# Patient Record
Sex: Male | Born: 1945 | Race: White | Hispanic: No | Marital: Married | Smoking: Former smoker
Health system: Southern US, Community
[De-identification: ages and names within clinical notes are randomized; demographics above are authoritative.]

## PROBLEM LIST (undated history)

## (undated) DIAGNOSIS — K5730 Diverticulosis of large intestine without perforation or abscess without bleeding: Secondary | ICD-10-CM

## (undated) DIAGNOSIS — C61 Malignant neoplasm of prostate: Secondary | ICD-10-CM

## (undated) DIAGNOSIS — F1010 Alcohol abuse, uncomplicated: Secondary | ICD-10-CM

## (undated) DIAGNOSIS — M1990 Unspecified osteoarthritis, unspecified site: Secondary | ICD-10-CM

## (undated) DIAGNOSIS — E785 Hyperlipidemia, unspecified: Secondary | ICD-10-CM

## (undated) DIAGNOSIS — I519 Heart disease, unspecified: Secondary | ICD-10-CM

## (undated) DIAGNOSIS — I10 Essential (primary) hypertension: Secondary | ICD-10-CM

## (undated) DIAGNOSIS — E7800 Pure hypercholesterolemia, unspecified: Secondary | ICD-10-CM

## (undated) DIAGNOSIS — K635 Polyp of colon: Secondary | ICD-10-CM

## (undated) DIAGNOSIS — I739 Peripheral vascular disease, unspecified: Secondary | ICD-10-CM

## (undated) DIAGNOSIS — I499 Cardiac arrhythmia, unspecified: Secondary | ICD-10-CM

## (undated) DIAGNOSIS — I2510 Atherosclerotic heart disease of native coronary artery without angina pectoris: Secondary | ICD-10-CM

## (undated) HISTORY — DX: Essential (primary) hypertension: I10

## (undated) HISTORY — PX: LIPOMA EXCISION: SHX5283

## (undated) HISTORY — PX: APPENDECTOMY: SHX54

## (undated) HISTORY — DX: Malignant neoplasm of prostate: C61

## (undated) HISTORY — PX: CATARACT EXTRACTION: SUR2

## (undated) HISTORY — DX: Heart disease, unspecified: I51.9

## (undated) HISTORY — DX: Polyp of colon: K63.5

## (undated) HISTORY — DX: Pure hypercholesterolemia, unspecified: E78.00

## (undated) HISTORY — DX: Diverticulosis of large intestine without perforation or abscess without bleeding: K57.30

## (undated) HISTORY — PX: METACARPOPHALANGEAL JOINT ARTHROPLASTY: SUR72

---

## 2001-12-01 HISTORY — PX: PROSTATECTOMY: SHX69

## 2006-12-01 HISTORY — PX: COLON SURGERY: SHX602

## 2007-10-04 ENCOUNTER — Ambulatory Visit: Payer: Self-pay | Admitting: Gastroenterology

## 2007-11-16 ENCOUNTER — Inpatient Hospital Stay: Payer: Self-pay | Admitting: General Surgery

## 2010-11-11 ENCOUNTER — Ambulatory Visit: Payer: Self-pay | Admitting: Gastroenterology

## 2010-12-05 ENCOUNTER — Ambulatory Visit: Payer: Self-pay | Admitting: Internal Medicine

## 2012-06-14 DIAGNOSIS — M189 Osteoarthritis of first carpometacarpal joint, unspecified: Secondary | ICD-10-CM | POA: Insufficient documentation

## 2012-11-01 DIAGNOSIS — M25559 Pain in unspecified hip: Secondary | ICD-10-CM | POA: Insufficient documentation

## 2012-11-10 ENCOUNTER — Ambulatory Visit: Payer: Self-pay | Admitting: Gastroenterology

## 2013-03-23 ENCOUNTER — Ambulatory Visit: Payer: Self-pay | Admitting: Orthopedic Surgery

## 2013-04-04 DIAGNOSIS — M19019 Primary osteoarthritis, unspecified shoulder: Secondary | ICD-10-CM | POA: Insufficient documentation

## 2013-04-04 DIAGNOSIS — M25512 Pain in left shoulder: Secondary | ICD-10-CM | POA: Insufficient documentation

## 2013-06-07 ENCOUNTER — Encounter: Payer: Self-pay | Admitting: Sports Medicine

## 2013-07-01 ENCOUNTER — Encounter: Payer: Self-pay | Admitting: Sports Medicine

## 2013-10-06 ENCOUNTER — Encounter (INDEPENDENT_AMBULATORY_CARE_PROVIDER_SITE_OTHER): Payer: Medicare Other | Admitting: Ophthalmology

## 2013-10-06 DIAGNOSIS — H35059 Retinal neovascularization, unspecified, unspecified eye: Secondary | ICD-10-CM

## 2013-10-06 DIAGNOSIS — I10 Essential (primary) hypertension: Secondary | ICD-10-CM

## 2013-10-06 DIAGNOSIS — H2510 Age-related nuclear cataract, unspecified eye: Secondary | ICD-10-CM

## 2013-10-06 DIAGNOSIS — H35039 Hypertensive retinopathy, unspecified eye: Secondary | ICD-10-CM

## 2013-10-06 DIAGNOSIS — H43819 Vitreous degeneration, unspecified eye: Secondary | ICD-10-CM

## 2013-10-13 ENCOUNTER — Encounter (INDEPENDENT_AMBULATORY_CARE_PROVIDER_SITE_OTHER): Payer: Self-pay | Admitting: Ophthalmology

## 2013-10-24 ENCOUNTER — Ambulatory Visit (INDEPENDENT_AMBULATORY_CARE_PROVIDER_SITE_OTHER): Payer: Medicare Other | Admitting: Ophthalmology

## 2013-11-07 ENCOUNTER — Ambulatory Visit (INDEPENDENT_AMBULATORY_CARE_PROVIDER_SITE_OTHER): Payer: Medicare Other | Admitting: Ophthalmology

## 2013-11-07 DIAGNOSIS — H35059 Retinal neovascularization, unspecified, unspecified eye: Secondary | ICD-10-CM

## 2013-12-01 HISTORY — PX: ULNAR NERVE TRANSPOSITION: SHX2595

## 2013-12-19 ENCOUNTER — Ambulatory Visit: Payer: Self-pay | Admitting: Anesthesiology

## 2013-12-22 ENCOUNTER — Ambulatory Visit: Payer: Self-pay | Admitting: Orthopedic Surgery

## 2014-02-06 ENCOUNTER — Encounter (INDEPENDENT_AMBULATORY_CARE_PROVIDER_SITE_OTHER): Payer: Medicare Other | Admitting: Ophthalmology

## 2014-02-23 ENCOUNTER — Encounter (INDEPENDENT_AMBULATORY_CARE_PROVIDER_SITE_OTHER): Payer: Medicare Other | Admitting: Ophthalmology

## 2014-02-23 DIAGNOSIS — H2510 Age-related nuclear cataract, unspecified eye: Secondary | ICD-10-CM

## 2014-02-23 DIAGNOSIS — I10 Essential (primary) hypertension: Secondary | ICD-10-CM

## 2014-02-23 DIAGNOSIS — H43819 Vitreous degeneration, unspecified eye: Secondary | ICD-10-CM

## 2014-02-23 DIAGNOSIS — H35039 Hypertensive retinopathy, unspecified eye: Secondary | ICD-10-CM

## 2014-02-23 DIAGNOSIS — H35059 Retinal neovascularization, unspecified, unspecified eye: Secondary | ICD-10-CM

## 2014-04-26 DIAGNOSIS — E785 Hyperlipidemia, unspecified: Secondary | ICD-10-CM | POA: Insufficient documentation

## 2014-04-26 DIAGNOSIS — K5730 Diverticulosis of large intestine without perforation or abscess without bleeding: Secondary | ICD-10-CM | POA: Insufficient documentation

## 2014-04-26 DIAGNOSIS — I10 Essential (primary) hypertension: Secondary | ICD-10-CM | POA: Insufficient documentation

## 2014-07-05 ENCOUNTER — Encounter: Payer: Self-pay | Admitting: *Deleted

## 2014-07-06 ENCOUNTER — Encounter: Payer: Self-pay | Admitting: Neurology

## 2014-07-06 ENCOUNTER — Ambulatory Visit (INDEPENDENT_AMBULATORY_CARE_PROVIDER_SITE_OTHER): Payer: Medicare Other | Admitting: Neurology

## 2014-07-06 VITALS — BP 110/80 | HR 70 | Ht 69.69 in | Wt 213.0 lb

## 2014-07-06 DIAGNOSIS — G5620 Lesion of ulnar nerve, unspecified upper limb: Secondary | ICD-10-CM

## 2014-07-06 DIAGNOSIS — R209 Unspecified disturbances of skin sensation: Secondary | ICD-10-CM

## 2014-07-06 DIAGNOSIS — G5622 Lesion of ulnar nerve, left upper limb: Secondary | ICD-10-CM

## 2014-07-06 DIAGNOSIS — G5602 Carpal tunnel syndrome, left upper limb: Secondary | ICD-10-CM

## 2014-07-06 DIAGNOSIS — G5600 Carpal tunnel syndrome, unspecified upper limb: Secondary | ICD-10-CM

## 2014-07-06 LAB — TSH: TSH: 1.386 u[IU]/mL (ref 0.350–4.500)

## 2014-07-06 LAB — SEDIMENTATION RATE: Sed Rate: 4 mm/hr (ref 0–16)

## 2014-07-06 LAB — C-REACTIVE PROTEIN: CRP: <0.5 mg/dL (ref <0.60)

## 2014-07-06 LAB — CK: Total CK: 545 U/L — ABNORMAL HIGH (ref 7–232)

## 2014-07-06 NOTE — Patient Instructions (Addendum)
1.  Check blood work  2.  EMG of the upper extremities (left > right) 3.  Start using wrist splint on left hand to minimize hyperflexion of the wrist 4.  We will contact you about nerve ultrasound appointments 5.  Return to clinic in 6-8 weeks

## 2014-07-06 NOTE — Progress Notes (Signed)
Note faxed.

## 2014-07-06 NOTE — Progress Notes (Signed)
[facility] Clinic Note - Initial Visit   Date: 07/06/2014  Willie Burton MRN: [id number] DOB: 07/31/1946   Dear Dr. Malvin Johns:  Thank you for your kind referral of Willie Burton for consultation of left ulnar neuropathy . Although his history is well known to you, please allow [place] to reiterate it for the purpose of our medical record. The patient was accompanied to the clinic by self.    History of Present Illness: Willie Burton is a 68 y.o. right-handed Caucasian male with history of prostate cancer s/p prostatectomy (2009), hypertension, and hyperlipidemia presenting for evaluation of left ulnar neuropathy.    Starting September 2014, he woke up one morning and his hand was asleep.  As the day progressed, the numbness continued and did not improve and since then has been constant.  It involves the last two fingers of the left and hand medial palm, especially over the tips.  He thought it would eventually go away, but due to persistent symptoms, eventually saw Dr. Rosita Kea (ortho) at [facility] in December.  He was referred to have EMG which showed left ulnar neuropathy (moderately severe) and underwent left ulnar transposition in January.  Following surgery, there was no significant change in paresthesias.  Prior to surgery, he noticed mild atrophy of the ADM, but none of FDI and did not have any appreciateable weakness of the hand.  Starting in June 2015, he noticed mild hand weakness and atrophy of the FDI and worsening atrophy of the ADM so went back for evaluation.  Dr. Malvin Johns repeated NCS/EMG of the left arm which showed "left ulnar neuropathy at the elbow".  NCS shows ulnar slowing worse in the forearm and severe CTS (previously not present on his EMG from December).  He denies any similar symptoms of the right hand.  He had MRI of the cervical spine in 2012 which showed C4-5 annular bulge with endplate ostrophytes narrowing the thecal sac with cervical cord  deformity.    He does endrose to sitting at his computer desk about 8-10hr per day for one week of the month and would rest his proximal forearm on his desk chair, but denies having spells of numbness/tingling during that time.   Past Medical History  Diagnosis Date  . High blood pressure   . Prostate cancer   . High cholesterol     Past Surgical History  Procedure Laterality Date  . Ulnar nerve transposition Left January 2015     Medications:  Aspirin 81 mg Flexeril 10 mg daily as needed Metoprolol 25 mg daily Pravastatin 20 mg daily  Allergies:  Allergies  Allergen Reactions  . Hepatitis A Antigen   . Typhoid Vaccines   . Amoxicillin Rash    Family History: Family History  Problem Relation Age of Onset  . Throat cancer Father     Deceased, 37s  . COPD Mother     Deceased, 2  . Healthy Brother   . Healthy Daughter   . Healthy Son     Social History: History   Social History  . Marital Status: Married    Spouse Name: N/A    Number of Children: 2  . Years of Education: N/A   Occupational History  . retired    Social History Main Topics  . Smoking status: Former Smoker -- 1.50 packs/day for 5 years    Types: Cigarettes  . Smokeless tobacco: Never Used     Comment: Quit 40+ years ago  . Alcohol  Use: No  . Drug Use: No  . Sexual Activity: Not on file   Other Topics Concern  . Not on file   Social History Narrative   He lives with his wife.   Retired Chief Financial Officer.   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   EYES: No visual changes or eye pain ENT: No hearing changes.  No history of nose bleeds.   RESPIRATORY: No cough, wheezing and shortness of breath.   CARDIOVASCULAR: Negative for chest pain, and palpitations.   GI: Negative for abdominal discomfort, blood in stools or black stools.  No recent change in bowel habits.   GU:  No history of  incontinence.   MUSCLOSKELETAL: No history of joint pain or swelling.  No myalgias.   SKIN: Negative for lesions, rash, and itching.   HEMATOLOGY/ONCOLOGY: Negative for prolonged bleeding, bruising easily, and swollen nodes.   ENDOCRINE: Negative for cold or heat intolerance, polydipsia or goiter.   PSYCH:  No depression or anxiety symptoms.   NEURO: As Above.   Vital Signs:  BP 110/80  Pulse 70  Ht 5' 9.69" (1.77 m)  Wt 213 lb (96.616 kg)  BMI 30.84 kg/m2  SpO2 99%   General Medical Exam:   General:  Well appearing, comfortable.   Eyes/ENT: see cranial nerve examination.   Neck: No masses appreciated.  Full range of motion without tenderness.  No carotid bruits. Respiratory:  Clear to auscultation, good air entry bilaterally.   Cardiac:  Regular rate and rhythm, no murmur.   Extremities:  No deformities, edema, or skin discoloration. Good capillary refill.   Skin:  Skin color, texture, turgor normal. No rashes or lesions.  Neurological Exam: MENTAL STATUS including orientation to time, place, person, recent and remote memory, attention span and concentration, language, and fund of knowledgeis normal.  Speech is not dysarthric.  CRANIAL NERVES: II:  No visual field defects.  Unremarkable fundi.   III-IV-VI: Pupils equal round and reactive to light.  Normal conjugate, extra-ocular eye movements in all directions of gaze.  No nystagmus.  V:  Normal facial sensation.   VII:  Normal facial symmetry and movements.    VIII:  Normal hearing and vestibular function.   IX-X:  Normal palatal movement.   XI:  Normal shoulder shrug and head rotation.   XII:  Normal tongue strength and range of motion, no deviation or fasciculation.  MOTOR: Marked atrophy of the ADM > FDI > APB on the left.  Rare fasciculations are seen in the left FDI. No pronator drift.  Tone is normal.    Right Upper Extremity:    Left Upper Extremity:    Deltoid  5/5   Deltoid  5/5   Biceps  5/5   Biceps  5/5     Triceps  5/5   Triceps  5/5   Wrist extensors  5/5   Wrist extensors  5/5   Wrist flexors  5/5   Wrist flexors  5/5   Finger extensors  5/5   Finger extensors  5/5   Finger flexors  5/5   Finger flexors  5/5   ADM  5/5  ADM  4-/5  FDI  5/5   FDI 4+/5  FDP  5/5   FDP 4/5 5-/5  Abductor pollicis  5/5   Abductor pollicis  5-/5   Tone (Ashworth scale)  0  Tone (Ashworth scale)  0  Right Lower Extremity:    Left Lower Extremity:    Hip flexors  5/5   Hip flexors  5/5   Hip extensors  5/5   Hip extensors  5/5   Knee flexors  5/5   Knee flexors  5/5   Knee extensors  5/5   Knee extensors  5/5   Dorsiflexors  5/5   Dorsiflexors  5/5   Plantarflexors  5/5   Plantarflexors  5/5   Toe extensors  5/5   Toe extensors  5/5   Toe flexors  5/5   Toe flexors  5/5   Tone (Ashworth scale)  0  Tone (Ashworth scale)  0   MSRs:  Right                                                                 Left brachioradialis 2+  brachioradialis 2+  biceps 2+  biceps 2+  triceps 2+  triceps 2+  patellar 1+  patellar 2+  ankle jerk 2+  ankle jerk 2+  Hoffman no  Hoffman no  plantar response down  plantar response down   SENSORY:  Hyperesthesia to pinprick over the left medial hand and fourth and fifth digits. Vibration intact in upper extremities and reduced distal to the ankle on the right lower extremity. Pin prick intact in the lower extremity.  Romberg's sign absent.   COORDINATION/GAIT: Normal finger-to- nose-finger and heel-to-shin.  Intact rapid alternating movements bilaterally.  Able to rise from a chair without using arms.  Gait narrow based and stable. Tandem and stressed gait intact.  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 There is a right paracentral disc protrusion with narrowing of the right neural foramen at this level.  EMG of the left upper extremity 11/09/2013: Moderately severe left ulnar neuropathy at the elbow with ongoing active denervation.  EMG of the left upper extremity 06/07/2014: Mild, chronic left ulnar neuropathy at the elbow.   IMPRESSION: Mr. Holzhauer is a delightful 68 year old gentleman with history of left ulnar neuropathy s/p ulnar transposition (12/2013) presenting for evaluation of left hand weakness and atrophy. His examination shows weakness and atrophy involving the abductor pollicis brevis, abductor digiti minimi, and first dorsal interosseous muscles. Reflexes are preserved throughout except the right patella jerk is depressed. Sensation shows hyperesthesia over the dorsal ulnar cutaneous distribution on the left.  After reviewing his electrodiagnostic testing, he has both left ulnar neuropathy with slowing across the forearm and likely median neuropathy at the wrist on the left side. I would like to repeat his electrodiagnostic testing to look for additional demyelinating changes, which may suggest a polyradiculoneuropathy, especially since his conduction velocity slowing appears to be both in the forearm and across the elbow involving the ulnar nerve. There is also greater than 50% reduction in median nerve amplitude on his most recent EMG, which was not previously seen in December.  Additionally, I think it is reasonable to obtain nerve ultrasound of  both median and ulnar nerves to look for structural pathology/localized edema which may further help better localize his symptoms.  I offered at neuropathic medications for paresthesias, but patient is not interested given that he predominantly has numbness more so than tingling.  Finally, routine neuropathy labs will be checked.    PLAN/RECOMMENDATIONS:  1.  Check 2-hr  glucose tolerance test, TSH, ESR, CRP, CK 2.  NCS/EMG of the left > right upper extremity 3.  Nerve ultrasound of the right median and ulnar nerves 4.  Recommended patient to start using a wrist splint on the left hand and to avoid hyperflexion for possible underlying CTS  5.  Return to clinic in 6-8 weeks.   The duration of this appointment visit was 40 minutes of face-to-face time with the patient.  Greater than 50% of this time was spent in counseling, explanation of diagnosis, planning of further management, and coordination of care.   Thank you for allowing me to participate in patient's care.  If I can answer any additional questions, I would be pleased to do so.    Sincerely,    Donika K. Posey Pronto, DO

## 2014-07-10 ENCOUNTER — Other Ambulatory Visit: Payer: Self-pay | Admitting: Neurology

## 2014-07-10 ENCOUNTER — Telehealth: Payer: Self-pay | Admitting: *Deleted

## 2014-07-10 LAB — GLUCOSE, 2 HOUR
GLUCOSE FASTING: 95 mg/dL (ref 70–110)
Glucose 2 Hour: 106 mg/dL

## 2014-07-10 NOTE — Telephone Encounter (Signed)
I'll have Caryl Pina look into this.  I only want nerve [place] and plan on doing EMG here.  Donika K. Posey Pronto, DO

## 2014-07-10 NOTE — Telephone Encounter (Signed)
Patient called asking why he has a EMG set up at [facility] when he just had  A EMG with Dr Posey Pronto ? He also is wondering when his Ultra sound is scheduled for of his wrist . Please advise

## 2014-07-11 ENCOUNTER — Telehealth: Payer: Self-pay | Admitting: Neurology

## 2014-07-11 NOTE — Telephone Encounter (Signed)
Glucose tolerance test 07/10/2014: normal (fasting 95, 2 hr 106)  Donika K. Posey Pronto, DO

## 2014-07-11 NOTE — Telephone Encounter (Signed)
I called [facility] and the appointment is for [place].  It is scheduled in the EMG department.  Patient informed.

## 2014-07-13 ENCOUNTER — Ambulatory Visit (INDEPENDENT_AMBULATORY_CARE_PROVIDER_SITE_OTHER): Payer: Medicare Other | Admitting: Neurology

## 2014-07-13 DIAGNOSIS — R209 Unspecified disturbances of skin sensation: Secondary | ICD-10-CM

## 2014-07-13 DIAGNOSIS — G5602 Carpal tunnel syndrome, left upper limb: Secondary | ICD-10-CM

## 2014-07-13 DIAGNOSIS — G5622 Lesion of ulnar nerve, left upper limb: Secondary | ICD-10-CM

## 2014-07-13 NOTE — Procedures (Signed)
[facility]  [address]  [place], [place] [address] Tel: [phone] Fax:  [phone] Test Date:  07/13/2014  Patient: Willie Burton DOB: 1946/11/09 Physician: Narda Amber, DO  Sex: Male Height: 5\' 9"  Ref Phys: Narda Amber  ID#: [id number] Temp: 32.0C Technician: Laureen Ochs R. NCS T.   Patient Complaints: Patient is a 68 year old male status post left ulnar nerve transposition January 2015 here for evaluation of left hand weakness, atrophy and numbness.  NCV & EMG Findings: Extensive electrodiagnostic testing of the left upper extremity and additional studies of the right reveals:  1. Absent ulnar and dorsal cutaneous sensory response on the left. The right ulnar sensory response, bilateral median, and left radial sensory responses are within normal limits. 2. Left ulnar motor nerve recording at the abductor digiti minimi showed reduced amplitude (4.5 mV) and decreased conduction velocity (A Elbow-B Elbow, 32 m/s).  The ulnar motor response recording at the first dorsal interosseous muscle is within normal limits. When compared to his previous EMG dated 06/07/2014 and performed at [facility], there has been a relative improvement of ulnar motor amplitude, although sensory responses remain absent. 3. Left Ulnar F wave is normal. 4. Chronic motor axon loss changes are seen affecting the first dorsal interosseous, abductor digiti minimi, flexor digitorum profundus 4 and 5, and flexor carpi ulnaris muscles. Sparse active motor axon loss changes is isolated to the abductor digit minimi muscle.   Impression: 1. 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 When compared to his previous EMG dated 06/07/2014 performed at [facility], there has been relative improvement of motor amplitudes and there are signs of reinnervation. 2. There is no definite evidence of carpal tunnel syndrome affecting bilateral upper  extremities.   ___________________________ Narda Amber, DO    Nerve Conduction Studies Anti Sensory Summary Table   Site NR Peak (ms) Norm Peak (ms) P-T Amp (V) Norm P-T Amp  Left DorsCutan Anti Sensory (Dorsum 5th MC)  32C  Wrist NR  <3.2  >5  Left Median Anti Sensory (2nd Digit)  32C  Wrist    3.2 <3.8 24.9 >10  Right Median Anti Sensory (2nd Digit)  32C  Wrist    3.3 <3.8 18.5 >10  Left Radial Anti Sensory (Base 1st Digit)  32C  Wrist    2.2 <2.8 22.6 >10  Left Ulnar Anti Sensory (5th Digit)  32C  Wrist NR 1.9 <3.2 8.9 >5  Right Ulnar Anti Sensory (5th Digit)  32C  Wrist    3.2 <3.2 14.9 >5   Motor Summary Table   Site NR Onset (ms) Norm Onset (ms) O-P Amp (mV) Norm O-P Amp Site1 Site2 Delta-0 (ms) Dist (cm) Vel (m/s) Norm Vel (m/s)  Left Median Motor (Abd Poll Brev)  32C  Wrist    2.9 <4.0 9.8 >5 Elbow Wrist 4.9 27.5 56 >50  Elbow    7.8  9.5         Right Median Motor (Abd Poll Brev)  32C  Wrist    3.0 <4.0 9.6 >5 Elbow Wrist 5.1 29.0 57 >50  Elbow    8.1  9.3         Left Ulnar Motor (Abd Dig Minimi)  32C  Wrist    3.0 <3.1 4.5 >7 B Elbow Wrist 2.8 18.0 64 >50  B Elbow    5.8  3.1  A Elbow B Elbow 4.0 13.0 32 >50  A Elbow  9.8  3.1         Right Ulnar Motor (Abd Dig Minimi)  32C    Patient did not tolerate proximal stimulation  Wrist    2.0 <3.1 8.9 >7 B Elbow Wrist 4.8 24.0 50 >50  B Elbow    6.8  9.1         Left Ulnar (FDI) Motor (1st DI)  32C  Wrist    4.5 <4.5 9.0 >7         F Wave Studies   NR F-Lat (ms) Lat Norm (ms) L-R F-Lat (ms)  Left Ulnar (Mrkrs) (Abd Dig Min)  32C     32.88 <33    EMG   Side Muscle Ins Act Fibs Psw Fasc Number Recrt Dur Dur. Amp Amp. Poly Poly. Comment  Left 1stDorInt Nml Nml Nml Nml 2- Rapid Many 1+ Some 1+ Some 1+ N/A  Left ABD Dig Min Nml 1+ Nml Nml 3- Rapid Most 1+ Many 2+ Many 1+ MMAV  Left FlexPolLong Nml Nml Nml Nml Nml Nml Nml Nml Nml Nml Nml Nml N/A  Left Triceps Nml Nml Nml Nml Nml Nml Nml Nml Nml  Nml Nml Nml N/A  Left FlexDigProf 4,5 Nml Nml Nml Nml 1- Mod-V Few 1+ Few 1+ Nml Nml N/A  Left FlexCarpiUln Nml Nml Nml Nml 1- Mod-R Few 1+ Few 1+ Nml Nml N/A      Waveforms:

## 2014-07-17 ENCOUNTER — Telehealth: Payer: Self-pay | Admitting: *Deleted

## 2014-07-17 NOTE — Telephone Encounter (Signed)
a 

## 2014-07-17 NOTE — Telephone Encounter (Signed)
I called [facility] to cancel nerve [place] per Dr. Serita Grit request.  Patient had already called and cancelled.

## 2014-07-21 ENCOUNTER — Encounter: Payer: Self-pay | Admitting: Neurology

## 2014-09-08 ENCOUNTER — Ambulatory Visit (INDEPENDENT_AMBULATORY_CARE_PROVIDER_SITE_OTHER): Payer: Medicare Other | Admitting: Ophthalmology

## 2014-09-08 DIAGNOSIS — I10 Essential (primary) hypertension: Secondary | ICD-10-CM

## 2014-09-08 DIAGNOSIS — H43813 Vitreous degeneration, bilateral: Secondary | ICD-10-CM

## 2014-09-08 DIAGNOSIS — H35033 Hypertensive retinopathy, bilateral: Secondary | ICD-10-CM

## 2014-09-08 DIAGNOSIS — H3531 Nonexudative age-related macular degeneration: Secondary | ICD-10-CM

## 2014-09-21 ENCOUNTER — Encounter: Payer: Medicare Other | Admitting: Neurology

## 2014-09-26 ENCOUNTER — Ambulatory Visit: Payer: Medicare Other | Admitting: Neurology

## 2014-10-02 ENCOUNTER — Ambulatory Visit: Payer: Medicare Other | Admitting: Neurology

## 2014-10-13 ENCOUNTER — Ambulatory Visit (INDEPENDENT_AMBULATORY_CARE_PROVIDER_SITE_OTHER): Payer: Medicare Other | Admitting: Neurology

## 2014-10-13 ENCOUNTER — Encounter: Payer: Self-pay | Admitting: Neurology

## 2014-10-13 VITALS — BP 150/84 | HR 66 | Ht 69.0 in | Wt 209.0 lb

## 2014-10-13 DIAGNOSIS — R29898 Other symptoms and signs involving the musculoskeletal system: Secondary | ICD-10-CM

## 2014-10-13 DIAGNOSIS — G5622 Lesion of ulnar nerve, left upper limb: Secondary | ICD-10-CM

## 2014-10-13 DIAGNOSIS — M6289 Other specified disorders of muscle: Secondary | ICD-10-CM

## 2014-10-13 DIAGNOSIS — R748 Abnormal levels of other serum enzymes: Secondary | ICD-10-CM

## 2014-10-13 LAB — CK: CK TOTAL: 228 U/L (ref 7–232)

## 2014-10-13 NOTE — Progress Notes (Signed)
Follow-up Visit   Date: 10/13/2014    Willie Burton MRN: [id number] DOB: [birth year]   Interim History: Willie Burton is a 68 y.o. right-handed Caucasian male with history of prostate cancer s/p prostatectomy (2009), hypertension, and hyperlipidemia presenting for evaluation of left ulnar neuropathy and elevated CK.    History of present illness: Starting September 2014, he woke up one morning and his hand was asleep.  As the day progressed, the numbness continued and did not improve and since then has been constant.  It involves the last two fingers of the left and hand medial palm, especially over the tips.  He thought it would eventually go away, but due to persistent symptoms, eventually saw Dr. Rudene Burton (ortho) at [facility] in December.  He was referred to have EMG which showed left ulnar neuropathy (moderately severe) and underwent left ulnar transposition in January.  Following surgery, there was no significant change in paresthesias.  Prior to surgery, he noticed mild atrophy of the ADM, but none of FDI and did not have any appreciateable weakness of the hand.  Starting in June 2015, he noticed mild hand weakness and atrophy of the FDI and worsening atrophy of the ADM so went back for evaluation.  Dr. Melrose Burton repeated NCS/EMG of the left arm which showed "left ulnar neuropathy at the elbow".  NCS shows ulnar slowing worse in the forearm and severe CTS (previously not present on his EMG from December).  He denies any similar symptoms of the right hand.  He had MRI of the cervical spine in 2012 which showed C4-5 annular bulge with endplate ostrophytes narrowing the thecal sac with cervical cord deformity.    He does endrose to sitting at his computer desk about 8-10hr per day for one week of the month and would rest his proximal forearm on his desk chair, but denies having spells of numbness/tingling during that time.  UPDATE 10/13/2014:   He feels that his left hand is no weaker,  and possibly slightly stronger.  He continues to be atrophy and sensory loss over the ulnar distribution.  Denies any weakness or paresthesias over the median distribution.  He is having left thumb pain due to arthritis and is planning on having recontruction in December.  Denies any weakness of the legs or proximal arms.  No myalgias, dysphagia, dysarthria, or dyspnea.   Medications:  Current Outpatient Prescriptions on File Prior to Visit  Medication Sig Dispense Refill  . aspirin EC 81 MG tablet Take by mouth.    . cyclobenzaprine (FLEXERIL) 10 MG tablet Take by mouth.    . metoprolol tartrate (LOPRESSOR) 25 MG tablet Take by mouth.    . pravastatin (PRAVACHOL) 20 MG tablet Take by mouth.     No current facility-administered medications on file prior to visit.    Allergies:  Allergies  Allergen Reactions  . Hepatitis A Antigen   . Typhoid Vaccines   . Amoxicillin Rash     Review of Systems:  CONSTITUTIONAL: No fevers, chills, night sweats, or weight loss.   EYES: No visual changes or eye pain ENT: No hearing changes.  No history of nose bleeds.   RESPIRATORY: No cough, wheezing and shortness of breath.   CARDIOVASCULAR: Negative for chest pain, and palpitations.   GI: Negative for abdominal discomfort, blood in stools or black stools.  No recent change in bowel habits.   GU:  No history of incontinence.   MUSCLOSKELETAL: No history of joint pain or swelling.  No myalgias.   SKIN: Negative for lesions, rash, and itching.   ENDOCRINE: Negative for cold or heat intolerance, polydipsia or goiter.   PSYCH:  No depression or anxiety symptoms.   NEURO: As Above.   Vital Signs:  BP 150/84 mmHg  Pulse 66  Ht _0  (1.753 m)  Wt 209 lb (94.802 kg)  BMI 30.85 kg/m2  SpO2 99%  Neurological Exam: MENTAL STATUS including orientation to time, place, and person is normal.  Speech is not dysarthric.  CRANIAL NERVES:  Pupils equal round and reactive to light.  Face is symmetric.    MOTOR:  Motor strength is 5/5 in all extremities, except left FDI, ADM, and APB if 5-/5 (improved).  Marked atrophy of the left ADM > FDI > ADM.  No fasciculations or abnormal movements.  No pronator drift.  Tone is normal.    MSRs:  Reflexes are 2+/4 throughout, except right patella is 1+.  SENSORY: Hyperesthesia to pinprick over the left medial hand and fourth and fifth digits (unchanged).   COORDINATION/GAIT:  Normal finger-to- nose-finger and heel-to-shin.  Intact rapid alternating movements bilaterally.  Gait narrow based and stable.   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 When compared to his previous EMG dated 06/07/2014 performed at [facility], there has been relative improvement of motor amplitudes and there are signs of reinnervation. There is no definite evidence of carpal tunnel syndrome affecting bilateral upper extremities.  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 There is a right paracentral disc protrusion with narrowing of the right neural foramen at this level.  EMG of the left upper extremity 11/09/2013: Moderately severe left ulnar neuropathy at the elbow with ongoing active denervation.  EMG of the left upper extremity 06/07/2014: Mild, chronic left ulnar neuropathy at the elbow.   ESR 4, CRP <0.5, CK 545*, TSH 1.386, 2-hr glucose tolerance test -normal    IMPRESSION/PLAN: Willie Burton is a delightful 68 year old gentleman with left ulnar neuropathy s/p ulnar transposition (12/2013) returning with left hand weakness and atrophy. I repeated his EDX which did not show carpal tunnel syndrome, but did shows evidence of left ulnar neuropathy at the  elbow with signs of reinnervating and slight improvement of motor amplitudes.  Sensory responses were absent.  Clinically, his motor strength is showing improvement with FDI and ADM (5-/5), but there is still notable atrophy of these muscles.  It is a little puzzling that his ABP appears mildly atrophy, although median nerve EDX was normal.  At this time, I would like for him to continue his home exercises.  I explained neurological recovery can takes months to years and with the absence of ulnar sensory responses, sensation may always be permanents impaired.  Finally, his CKs were elevated when checked last (545) and EMG did not show any myopathy.  Exam does not support myopathy or motor neuron disease.  I will recheck CK and aldolase levels to trend them.  Return to clinic in 48-month or sooner as needed    The duration of this appointment visit was 25 minutes of face-to-face time with the patient.  Greater than 50% of this time was spent in counseling, explanation of diagnosis, planning of further management, and coordination of care.  Thank you for allowing me to participate in patient's care.  If I can answer any additional questions, I would be pleased to do so.    Sincerely,    Donika K. Posey Pronto, DO

## 2014-10-13 NOTE — Patient Instructions (Addendum)
1.  Check CK and aldolase 2.  Continue home exercises for your hand 3.  Return to clinic in 50-months

## 2014-10-16 ENCOUNTER — Telehealth: Payer: Self-pay | Admitting: Neurology

## 2014-10-16 LAB — ALDOLASE: ALDOLASE: 8.2 U/L — AB (ref <=8.1)

## 2014-10-16 NOTE — Telephone Encounter (Signed)
Pt called/returning your call from today. C/B [phone]

## 2014-11-10 ENCOUNTER — Emergency Department: Payer: Self-pay | Admitting: Emergency Medicine

## 2014-11-10 LAB — URINALYSIS, COMPLETE
Bacteria: NONE SEEN
Bilirubin,UR: NEGATIVE
Blood: NEGATIVE
Glucose,UR: NEGATIVE mg/dL (ref 0–75)
Ketone: NEGATIVE
Leukocyte Esterase: NEGATIVE
Nitrite: NEGATIVE
Ph: 5 (ref 4.5–8.0)
Protein: NEGATIVE
SQUAMOUS EPITHELIAL: NONE SEEN
Specific Gravity: 1.014 (ref 1.003–1.030)
WBC UR: 1 /HPF (ref 0–5)

## 2014-11-10 LAB — CREATININE, SERUM
Creatinine: 0.91 mg/dL (ref 0.60–1.30)
EGFR (African American): >60
EGFR (Non-African Amer.): >60

## 2014-11-15 DIAGNOSIS — M5126 Other intervertebral disc displacement, lumbar region: Secondary | ICD-10-CM | POA: Insufficient documentation

## 2014-11-15 DIAGNOSIS — M5416 Radiculopathy, lumbar region: Secondary | ICD-10-CM | POA: Insufficient documentation

## 2014-12-06 ENCOUNTER — Encounter: Payer: Self-pay | Admitting: Neurosurgery

## 2015-01-01 ENCOUNTER — Encounter: Payer: Self-pay | Admitting: Neurosurgery

## 2015-01-30 ENCOUNTER — Encounter
Admit: 2015-01-30 | Disposition: A | Discharge status: Home / Self Care | Payer: Self-pay | Attending: Neurosurgery | Admitting: Neurosurgery

## 2015-03-02 ENCOUNTER — Encounter
Admit: 2015-03-02 | Disposition: A | Discharge status: Home / Self Care | Payer: Self-pay | Attending: Neurosurgery | Admitting: Neurosurgery

## 2015-03-24 NOTE — Op Note (Signed)
PATIENT NAME:  Willie Burton, Willie Burton MR#:  [id number] DATE OF BIRTH:  [birth year]  DATE OF PROCEDURE:  12/22/2013  PREOPERATIVE DIAGNOSIS:  Left ulnar cubital tunnel syndrome.  POSTOPERATIVE DIAGNOSIS:  Left ulnar carpal tunnel syndrome.   PROCEDURE: Left ulnar nerve submuscular transposition.   ANESTHESIA:  General.  SURGEON: Hessie Knows, M.D.   DESCRIPTION OF PROCEDURE:  The patient was brought to the operating room and after adequate anesthesia was obtained, the left arm was prepped and draped in the usual sterile fashion with a tourniquet applied to the upper arm. After patient identification and timeout procedures were completed, the tourniquet was raised to 250 mmHg.  A Learmonth mouth incision was made over the medial aspect of the elbow and the subcutaneous tissue spread preserving branches as much as possible of the lateral antebrachial cutaneous nerves. The anterior portion of the elbow was exposed first exposing the common flexor origin for subsequent submuscular transposition. The cubital tunnel was then entered with some overlying muscle at the level of the epicondyle that appeared aberrant and this did appear to be an area of compression of the nerve. Release was carried out proximally and then distally to the FCU muscle belly, care being taken to preserve motor branches. Proximally, the intermuscular septum was resected and the nerve could be mobilized and brought anteriorly. This was carried out distally as well, mobilizing the nerve and being able to bring it anteriorly without any kinking of the nerve. An oblique cut was then made through the flexor fascia and the muscle elevated off from the proximal end. The fascia released including intermuscular septums to prevent pressure on the nerve. After adequate muscular bed had been created, the nerve was brought anteriorly and with range of motion, it stayed in position. There did not appear to be any undue pressure on the nerve. The muscular  fascia was then repaired using a #1 Vicryl; 3-0 Vicryl was used subcutaneously and 4-0 nylon for the skin. Xeroform, 4 x 4's, Webril and a posterior splint were applied. Tourniquet was let down.  TOURNIQUET TIME:  38 minutes.   COMPLICATIONS: There were no complications.   SPECIMEN: None.   ____________________________ Laurene Footman, MD mjm:ce D: 12/22/2013 16:56:52 ET T: 12/22/2013 20:12:22 ET JOB#: [id number]  cc: Laurene Footman, MD, <Dictator> Laurene Footman MD ELECTRONICALLY SIGNED 12/23/2013 8:32

## 2015-04-17 ENCOUNTER — Ambulatory Visit: Payer: Medicare Other | Admitting: Neurology

## 2015-04-19 ENCOUNTER — Encounter: Payer: Self-pay | Admitting: Neurology

## 2015-04-19 ENCOUNTER — Ambulatory Visit (INDEPENDENT_AMBULATORY_CARE_PROVIDER_SITE_OTHER): Payer: Medicare Other | Admitting: Neurology

## 2015-04-19 VITALS — BP 110/64 | HR 63 | Ht 69.0 in | Wt 214.2 lb

## 2015-04-19 DIAGNOSIS — M6530 Trigger finger, unspecified finger: Secondary | ICD-10-CM | POA: Diagnosis not present

## 2015-04-19 DIAGNOSIS — G5622 Lesion of ulnar nerve, left upper limb: Secondary | ICD-10-CM | POA: Diagnosis not present

## 2015-04-19 NOTE — Patient Instructions (Signed)
You look great today.  Come back and see me if your symptoms worsen.

## 2015-04-19 NOTE — Progress Notes (Signed)
Follow-up Visit   Date: 04/19/2015    Willie Burton MRN: [id number] DOB: 1946/09/17   Interim History: Willie Burton is a 69 y.o. right-handed Caucasian male with history of prostate cancer s/p prostatectomy (2009), hypertension, and hyperlipidemia presenting for evaluation of left ulnar neuropathy.    History of present illness: Starting September 2014, he woke up one morning and his hand was asleep.  As the day progressed, the numbness continued and did not improve and since then has been constant.  It involves the last two fingers of the left and hand medial palm, especially over the tips.  He thought it would eventually go away, but due to persistent symptoms, eventually saw Dr. Rudene Christians (ortho) at [facility] in December.  He was referred to have EMG which showed left ulnar neuropathy (moderately severe) and underwent left ulnar transposition in January.  Following surgery, there was no significant change in paresthesias.  Prior to surgery, he noticed mild atrophy of the ADM, but none of FDI and did not have any appreciateable weakness of the hand.  Starting in June 2015, he noticed mild hand weakness and atrophy of the FDI and worsening atrophy of the ADM so went back for evaluation.  Dr. Melrose Nakayama repeated NCS/EMG of the left arm which showed "left ulnar neuropathy at the elbow".  NCS shows ulnar slowing worse in the forearm and severe CTS (previously not present on his EMG from December).  He denies any similar symptoms of the right hand.  He had MRI of the cervical spine in 2012 which showed C4-5 annular bulge with endplate ostrophytes narrowing the thecal sac with cervical cord deformity.    He does endrose to sitting at his computer desk about 8-10hr per day for one week of the month and would rest his proximal forearm on his desk chair, but denies having spells of numbness/tingling during that time.  UPDATE 10/13/2014:   He feels that his left hand is no weaker, and possibly  slightly stronger.  He continues to be atrophy and sensory loss over the ulnar distribution.  Denies any weakness or paresthesias over the median distribution.  He is having left thumb pain due to arthritis and is planning on having recontruction in December.  Denies any weakness of the legs or proximal arms.  No myalgias, dysphagia, dysarthria, or dyspnea.  UPDATE 04/19/2015:  There has been no worsening of hand weakness, except intermittent cramping of the left hand, described as the ring finger getting stuck and having to manually open it.  He continues to have sensory loss over the ulnar distribution on the left.  He had reconstruction of the thumb joint and is pleased with the progress.  No new neurological complaints.    Medications:  Current Outpatient Prescriptions on File Prior to Visit  Medication Sig Dispense Refill  . aspirin EC 81 MG tablet Take by mouth.    . cyclobenzaprine (FLEXERIL) 10 MG tablet Take by mouth.    . metoprolol tartrate (LOPRESSOR) 25 MG tablet Take by mouth.    . pravastatin (PRAVACHOL) 20 MG tablet Take by mouth.     No current facility-administered medications on file prior to visit.    Allergies:  Allergies  Allergen Reactions  . Hepatitis A Antigen   . Typhoid Vaccines   . Amoxicillin Rash     Review of Systems:  CONSTITUTIONAL: No fevers, chills, night sweats, or weight loss.   EYES: No visual changes or eye pain ENT: No hearing changes.  No history of nose bleeds.   RESPIRATORY: No cough, wheezing and shortness of breath.   CARDIOVASCULAR: Negative for chest pain, and palpitations.   GI: Negative for abdominal discomfort, blood in stools or black stools.  No recent change in bowel habits.   GU:  No history of incontinence.   MUSCLOSKELETAL: No history of joint pain or swelling.  No myalgias.   SKIN: Negative for lesions, rash, and itching.   ENDOCRINE: Negative for cold or heat intolerance, polydipsia or goiter.   PSYCH:  No depression or  anxiety symptoms.   NEURO: As Above.   Vital Signs:  BP 110/64 mmHg  Pulse 63  Ht _0  (1.753 m)  Wt 214 lb 3 oz (97.155 kg)  BMI 31.62 kg/m2  SpO2 99%  Neurological Exam: MENTAL STATUS including orientation to time, place, and person is normal.  Speech is not dysarthric.  CRANIAL NERVES:  Face is symmetric.   MOTOR:  Motor strength is 5/5 in all extremities, including left FDI, ADM, and APB (improved).  Moderate atrophy of the left ADM only.  Muscle bulk of left FDI is improved.  No fasciculations or abnormal movements.  No pronator drift.  Tone is normal.    SENSORY: Hyperesthesia to pinprick over the left medial hand and fourth and fifth digits (unchanged).   COORDINATION/GAIT:    Gait narrow based and stable.   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 When compared to his previous EMG dated 06/07/2014 performed at [facility], there has been relative improvement of motor amplitudes and there are signs of reinnervation. There is no definite evidence of carpal tunnel syndrome affecting bilateral upper extremities.  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 There is a right paracentral disc protrusion with narrowing of the right neural foramen at this level.  EMG of the left upper extremity 11/09/2013: Moderately severe left ulnar neuropathy at the elbow with ongoing active denervation.  EMG of the left upper extremity 06/07/2014: Mild, chronic left ulnar neuropathy at the elbow.   ESR 4, CRP <0.5, CK 545*, TSH 1.386, 2-hr glucose tolerance test - normal  Lab Results  Component Value Date   CKTOTAL 228 10/13/2014     IMPRESSION/PLAN: Willie Burton is a  delightful 69 year old gentleman with left ulnar neuropathy s/p ulnar transposition (12/2013) returning with left hand weakness and atrophy. I repeated his EDX which did not show carpal tunnel syndrome, but did shows evidence of left ulnar neuropathy at the elbow with signs of reinnervating and slight improvement of motor amplitudes.  Sensory responses were absent.  Clinically, he is doing great - his motor strength is normal and muscle bulk of FDI is improved, still with mild atrophy of left ADM and sensory impairment. Since it has been >1 year from symptom onset, I explained that he may be left with permanent sensory loss over the ulnar distribution of the hand.  CKs rechecked and within normal limits. EMG did not show any myopathy and exam does not support myopathy or motor neuron disease.   His right finger symptoms are suggestive of trigger finger more than cramps.  He is already scheduled to see his hand surgeon and will discuss this with them.  Return to clinic as needed  The duration of this appointment visit was 15 minutes of face-to-face time with the patient.  Greater than 50% of this time was spent in counseling, explanation of diagnosis, planning of further management, and coordination of care.   Thank you for allowing me to participate in patient's care.  If I can answer any additional questions, I would be pleased to do so.    Sincerely,    Willie Berntsen K. Posey Pronto, DO

## 2015-11-12 ENCOUNTER — Other Ambulatory Visit: Payer: Self-pay | Admitting: Cardiology

## 2015-11-13 ENCOUNTER — Ambulatory Visit
Admission: RE | Admit: 2015-11-13 | Discharge: 2015-11-13 | Disposition: A | Discharge status: Home / Self Care | Payer: Medicare Other | Source: Ambulatory Visit | Attending: Cardiology | Admitting: Cardiology

## 2015-11-13 ENCOUNTER — Encounter: Payer: Self-pay | Admitting: *Deleted

## 2015-11-13 ENCOUNTER — Encounter
Admission: RE | Disposition: A | Discharge status: Home / Self Care | Payer: Self-pay | Source: Ambulatory Visit | Attending: Cardiology

## 2015-11-13 DIAGNOSIS — Z880 Allergy status to penicillin: Secondary | ICD-10-CM | POA: Diagnosis not present

## 2015-11-13 DIAGNOSIS — Z7982 Long term (current) use of aspirin: Secondary | ICD-10-CM | POA: Diagnosis not present

## 2015-11-13 DIAGNOSIS — E7800 Pure hypercholesterolemia, unspecified: Secondary | ICD-10-CM | POA: Diagnosis not present

## 2015-11-13 DIAGNOSIS — I2510 Atherosclerotic heart disease of native coronary artery without angina pectoris: Secondary | ICD-10-CM | POA: Diagnosis not present

## 2015-11-13 DIAGNOSIS — Z803 Family history of malignant neoplasm of breast: Secondary | ICD-10-CM | POA: Insufficient documentation

## 2015-11-13 DIAGNOSIS — Z79899 Other long term (current) drug therapy: Secondary | ICD-10-CM | POA: Insufficient documentation

## 2015-11-13 DIAGNOSIS — Z87891 Personal history of nicotine dependence: Secondary | ICD-10-CM | POA: Diagnosis not present

## 2015-11-13 DIAGNOSIS — Z9079 Acquired absence of other genital organ(s): Secondary | ICD-10-CM | POA: Diagnosis not present

## 2015-11-13 DIAGNOSIS — I119 Hypertensive heart disease without heart failure: Secondary | ICD-10-CM | POA: Insufficient documentation

## 2015-11-13 DIAGNOSIS — Z825 Family history of asthma and other chronic lower respiratory diseases: Secondary | ICD-10-CM | POA: Insufficient documentation

## 2015-11-13 DIAGNOSIS — Z811 Family history of alcohol abuse and dependence: Secondary | ICD-10-CM | POA: Diagnosis not present

## 2015-11-13 DIAGNOSIS — Z887 Allergy status to serum and vaccine status: Secondary | ICD-10-CM | POA: Diagnosis not present

## 2015-11-13 DIAGNOSIS — R9430 Abnormal result of cardiovascular function study, unspecified: Secondary | ICD-10-CM | POA: Diagnosis present

## 2015-11-13 DIAGNOSIS — Z800 Family history of malignant neoplasm of digestive organs: Secondary | ICD-10-CM | POA: Diagnosis not present

## 2015-11-13 DIAGNOSIS — E785 Hyperlipidemia, unspecified: Secondary | ICD-10-CM | POA: Insufficient documentation

## 2015-11-13 DIAGNOSIS — Z8546 Personal history of malignant neoplasm of prostate: Secondary | ICD-10-CM | POA: Diagnosis not present

## 2015-11-13 HISTORY — DX: Alcohol abuse, uncomplicated: F10.10

## 2015-11-13 HISTORY — PX: CARDIAC CATHETERIZATION: SHX172

## 2015-11-13 HISTORY — DX: Unspecified osteoarthritis, unspecified site: M19.90

## 2015-11-13 HISTORY — DX: Hyperlipidemia, unspecified: E78.5

## 2015-11-13 SURGERY — LEFT HEART CATH AND CORONARY ANGIOGRAPHY
Anesthesia: Moderate Sedation

## 2015-11-13 MED ORDER — IOHEXOL 300 MG/ML  SOLN
INTRAMUSCULAR | Status: DC | PRN
  Administered 2015-11-13: 120 mL via INTRA_ARTERIAL

## 2015-11-13 MED ORDER — MIDAZOLAM HCL 2 MG/2ML IJ SOLN
INTRAMUSCULAR | Status: DC | PRN
  Administered 2015-11-13: 1 mg via INTRAVENOUS
  Administered 2015-11-13: 0.5 mg via INTRAVENOUS
  Administered 2015-11-13: 1 mg via INTRAVENOUS

## 2015-11-13 MED ORDER — FENTANYL CITRATE (PF) 100 MCG/2ML IJ SOLN
INTRAMUSCULAR | Status: DC | PRN
  Administered 2015-11-13: 25 ug via INTRAVENOUS
  Administered 2015-11-13: 50 ug via INTRAVENOUS
  Administered 2015-11-13: 25 ug via INTRAVENOUS

## 2015-11-13 MED ORDER — SODIUM CHLORIDE 0.9 % IJ SOLN: 3.0000 mL | INTRAMUSCULAR | Status: DC | PRN

## 2015-11-13 MED ORDER — SODIUM CHLORIDE 0.9 % WEIGHT BASED INFUSION: 1.0000 mL/kg/h | INTRAVENOUS | Status: DC

## 2015-11-13 MED ORDER — FENTANYL CITRATE (PF) 100 MCG/2ML IJ SOLN
INTRAMUSCULAR | Status: AC
  Filled 2015-11-13: qty 2

## 2015-11-13 MED ORDER — ASPIRIN 81 MG PO CHEW: 81.0000 mg | CHEWABLE_TABLET | ORAL | Status: DC

## 2015-11-13 MED ORDER — SODIUM CHLORIDE 0.9 % IV SOLN
Freq: Once | INTRAVENOUS | Status: AC
  Administered 2015-11-13: 08:00:00 via INTRAVENOUS

## 2015-11-13 MED ORDER — SODIUM CHLORIDE 0.9 % IV SOLN: 250.0000 mL | INTRAVENOUS | Status: DC | PRN

## 2015-11-13 MED ORDER — SODIUM CHLORIDE 0.9 % IJ SOLN: 3.0000 mL | Freq: Two times a day (BID) | INTRAMUSCULAR | Status: DC

## 2015-11-13 MED ORDER — SODIUM CHLORIDE 0.9 % WEIGHT BASED INFUSION: 3.0000 mL/kg/h | INTRAVENOUS | Status: DC

## 2015-11-13 MED ORDER — MIDAZOLAM HCL 2 MG/2ML IJ SOLN
INTRAMUSCULAR | Status: AC
  Filled 2015-11-13: qty 2

## 2015-11-13 MED ORDER — HEPARIN (PORCINE) IN NACL 2-0.9 UNIT/ML-% IJ SOLN
INTRAMUSCULAR | Status: AC
  Filled 2015-11-13: qty 1000

## 2015-11-13 SURGICAL SUPPLY — 10 items
CATH INFINITI 5FR ANG PIGTAIL (CATHETERS) ×3 IMPLANT
CATH INFINITI 5FR JL4 (CATHETERS) ×3 IMPLANT
CATH INFINITI JR4 5F (CATHETERS) ×3 IMPLANT
DEVICE CLOSURE MYNXGRIP 5F (Vascular Products) ×3 IMPLANT
DEVICE CLOSURE MYNXGRIP 6/7F (Vascular Products) IMPLANT
KIT MANI 3VAL PERCEP (MISCELLANEOUS) ×3 IMPLANT
NEEDLE PERC 18GX7CM (NEEDLE) ×3 IMPLANT
PACK CARDIAC CATH (CUSTOM PROCEDURE TRAY) ×3 IMPLANT
SHEATH AVANTI 5FR X 11CM (SHEATH) ×3 IMPLANT
WIRE EMERALD 3MM-J .035X150CM (WIRE) ×3 IMPLANT

## 2015-11-13 NOTE — Discharge Instructions (Signed)

## 2015-11-13 NOTE — H&P (Signed)
Chief Complaint: Chief Complaint  Patient presents with  . Chest Pain  new pt per [facility] abnormal stress echo went to see pcp on routine visit c/o some chest discomfort he order the stress echo and here I am having no problems since  . Family Hx Of Heart Disease  strong hx brother mom  Date of Service: 11/12/2015 Date of Birth: Feb 25, 1946 PCP: Idelle Crouch, MD, MD  History of Present Illness: Willie Burton is a 69 y.o.male patient who is referred for evaluation of an abnormal stress echo. Patient is had some exertional chest discomfort. He has a strong family history of heart disease. He underwent an exercise stress echo read by Dr. Rusty Aus. He exercise 9 minutes in a Bruce protocol. Echo images suggested mild inferior ischemia. Risk factors include family history, hyperlipidemia and hypertension. His LDL is less than 100 on pravastatin. His blood pressure is controlled. His baseline electrocardiogram reveals sinus rhythm with nonspecific ST T wave changes.  Past Medical and Surgical History  Past Medical History Past Medical History  Diagnosis Date  . 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 Hyperlipidemia  . Hypertension  . Prostate cancer  S/p prostatectomy   Past Surgical History He has a past surgical history that includes Appendectomy (1965); Colectomy Partial Abdominal & Transanal (Right); thigh surgery (Right); Left Elbow Ulnar Nerve Submuscular Transposition 12/22/13. (Left, 12/22/13); transplant/transfer tendon forearm &/wrist (Left, 11/07/2014); and arthroplasty carpometacarpal/intercarpal joints (Left, 11/07/2014).   Medications and Allergies  Current Medications  Current Outpatient Prescriptions  Medication Sig Dispense Refill  . aspirin 81 MG EC tablet Take 81 mg by mouth daily.  . cyanocobalamin (VITAMIN B-12) 1000 MCG tablet Take 1,000 mcg by mouth once daily.  .  cyclobenzaprine (FLEXERIL) 10 MG tablet Take 1 tablet (10 mg total) by mouth nightly. 90 tablet 3  . ibuprofen (ADVIL,MOTRIN) 600 MG tablet 0  . metoprolol succinate (TOPROL-XL) 50 MG XL tablet TAKE 1 TABLET DAILY 90 tablet 1  . pravastatin (PRAVACHOL) 20 MG tablet TAKE 1 TABLET AT BEDTIME 90 tablet 4  . diazepam (VALIUM) 5 MG tablet 1-2 30 minutes before ESI (Patient not taking: Reported on 11/12/2015 ) 2 tablet 0   No current facility-administered medications for this visit.   Allergies: Amoxicillin; Hepatitis a virus vaccine; and Typhoid vaccine,live attenuated  Social and Family History  Social History reports that he quit smoking about 41 years ago. He has a 7.50 pack-year smoking history. He has never used smokeless tobacco. He reports that he does not drink alcohol or use illicit drugs.  Family History Family History  Problem Relation Age of Onset  . Breast cancer Mother  . COPD Mother  . Throat cancer Father  . Alcohol abuse Father  . Anesthesia problems Neg Hx   Review of Systems  Review of Systems  Constitutional: Negative for chills, diaphoresis, fever, malaise/fatigue and weight loss.  HENT: Negative for congestion, ear discharge, hearing loss and tinnitus.  Eyes: Negative for blurred vision.  Respiratory: Negative for cough, hemoptysis, sputum production, shortness of breath and wheezing.  Cardiovascular: Positive for chest pain. Negative for palpitations, orthopnea, claudication, leg swelling and PND.  Gastrointestinal: Negative for abdominal pain, blood in stool, constipation, diarrhea, heartburn, melena, nausea and vomiting.  Genitourinary: Negative for dysuria, frequency, hematuria and urgency.  Musculoskeletal: Negative for back pain, falls, joint pain and myalgias.  Skin: Negative for itching and rash.  Neurological:  Negative for dizziness, tingling, focal weakness, loss of consciousness, weakness and headaches.  Endo/Heme/Allergies: Negative for polydipsia.  Does not bruise/bleed easily.  Psychiatric/Behavioral: Negative for depression, memory loss and substance abuse. The patient is not nervous/anxious.   Physical Examination   Vitals: Visit Vitals  . BP 132/68 (BP Location: Left upper arm, Patient Position: Sitting, BP Cuff Size: Adult)  . Pulse 60  . Resp 12  . Ht 175.3 cm (5' 9.02")  . Wt 97.3 kg (214 lb 6.4 oz)  . BMI 31.64 kg/m2   Ht:175.3 cm (5' 9.02") Wt:97.3 kg (214 lb 6.4 oz) [id number] surface area is 2.18 meters squared. Body mass index is 31.64 kg/(m^2).  Wt Readings from Last 3 Encounters:  11/12/15 97.3 kg (214 lb 6.4 oz)  11/01/15 (P) 96.2 kg (212 lb)  05/02/15 96.6 kg (213 lb)   BP Readings from Last 3 Encounters:  11/12/15 132/68  11/01/15 (P) 142/80  05/02/15 138/82   General appearance appears in no acute distress  Head Mouth and Eye exam Normocephalic, without obvious abnormality, atraumatic Dentition is good Eyes appear anicteric   Neck exam Thyroid: normal  Nodes: no obvious adenopathy  LUNGS Breath Sounds: Normal Percussion: Normal  CARDIOVASCULAR JVP CV wave: no HJR: no Elevation at 90 degrees: None Carotid Pulse: normal pulsation bilaterally Bruit: None Apex: apical impulse normal  Auscultation Rhythm: normal sinus rhythm S1: normal S2: normal Clicks: no Rub: no Murmurs: no murmurs  Gallop: None ABDOMEN Liver enlargement: no Pulsatile aorta: no Ascites: no Bruits: no  EXTREMITIES Clubbing: no Edema: trace to 1+ bilateral pedal edema Pulses: peripheral pulses symmetrical Femoral Bruits: no Amputation: no SKIN Rash: no Cyanosis: no Embolic phemonenon: no Bruising: no NEURO Alert and Oriented to person, place and time: yes Non focal: yes  PSYCH: Pt appears to have normal affect  LABS REVIEWED Last 3 CBC results: Lab Results  Component Value Date  WBC 6.9 10/24/2015  WBC 4.8 04/25/2015  WBC 5.0 10/23/2014   Lab Results  Component Value Date  HGB 14.5  10/24/2015  HGB 15.1 04/25/2015  HGB 14.4 10/23/2014   Lab Results  Component Value Date  HCT 44.3 10/24/2015  HCT 44.3 04/25/2015  HCT 42.2 10/23/2014   Lab Results  Component Value Date  PLT 235 10/24/2015  PLT 224 04/25/2015  PLT 205 10/23/2014   Lab Results  Component Value Date  CREATININE 0.9 10/24/2015  BUN 16 10/24/2015  NA 140 10/24/2015  K 4.5 10/24/2015  CL 105 10/24/2015  CO2 28.1 10/24/2015   Lab Results  Component Value Date  HGBA1C 5.8 (H) 10/23/2014   Lab Results  Component Value Date  HDL 37.7 10/24/2015  HDL 37.3 04/25/2015  HDL 37.2 10/23/2014   Lab Results  Component Value Date  LDLCALC 81 10/24/2015  LDLCALC 99 04/25/2015  LDLCALC 97 10/23/2014   Lab Results  Component Value Date  TRIG 128 10/24/2015  TRIG 125 04/25/2015  TRIG 118 10/23/2014   Lab Results  Component Value Date  ALT 23 10/24/2015  AST 27 10/24/2015  ALKPHOS 53 10/24/2015   Lab Results  Component Value Date  TSH 1.931 10/23/2014   Diagnostic Studies Reviewed:  EKG EKG demonstrated normal sinus rhythm, nonspecific ST and T waves changes.  Assessment and Plan   69 y.o. male with  ICD-10-CM ICD-9-CM  1. Essential hypertension the-blood pressure is controlled on current regimen. Continue with this and dash diet I10 401.9  2. Pure hypercholesterolemia-continue with pravastatin. LDL goal of less than 100. E78.00 272.0  3. Atypical chest pain-etiology of chest pain appears to be ischemic based on stress echo read by Dr. Sabra Heck suggesting inferior ischemia. Patient has multiple risk factors for heart disease. Will proceed left heart catheterization to evaluate coronary anatomy to guide further therapy. Risk and benefits were explained to the patient and his wife and they agreed to proceed. R07.89 [id number] Basic Metabolic Panel (BMP)  CBC w/auto Differential (5 Part)  Prothrombin Time (INR)   Return in about 2 weeks (around 11/26/2015).  These notes generated with  voice recognition software. I apologize for typographical errors.  Sydnee Levans, MD

## 2015-11-15 DIAGNOSIS — I2510 Atherosclerotic heart disease of native coronary artery without angina pectoris: Secondary | ICD-10-CM | POA: Insufficient documentation

## 2015-11-20 DIAGNOSIS — G8918 Other acute postprocedural pain: Secondary | ICD-10-CM | POA: Insufficient documentation

## 2015-11-20 DIAGNOSIS — Z951 Presence of aortocoronary bypass graft: Secondary | ICD-10-CM | POA: Insufficient documentation

## 2015-11-22 DIAGNOSIS — D62 Acute posthemorrhagic anemia: Secondary | ICD-10-CM | POA: Insufficient documentation

## 2015-11-22 DIAGNOSIS — I951 Orthostatic hypotension: Secondary | ICD-10-CM | POA: Insufficient documentation

## 2015-12-31 ENCOUNTER — Encounter: Payer: Medicare Other | Attending: Cardiology | Admitting: *Deleted

## 2015-12-31 VITALS — Ht 69.5 in | Wt 215.1 lb

## 2015-12-31 DIAGNOSIS — Z951 Presence of aortocoronary bypass graft: Secondary | ICD-10-CM | POA: Diagnosis present

## 2015-12-31 NOTE — Progress Notes (Signed)
Cardiac Individual Treatment Plan  Patient Details  Name: Willie Burton MRN: [id number] Date of Birth: January 31, 1946 Referring Provider:  Teodoro Spray, MD  Initial Encounter Date: Date: 12/31/15  Visit Diagnosis: S/P CABG x 3  Patient's Home Medications on Admission:  Current outpatient prescriptions:  .  aspirin EC 81 MG tablet, Take by mouth daily. , Disp: , Rfl:  .  atorvastatin (LIPITOR) 40 MG tablet, Take 40 mg by mouth daily., Disp: , Rfl:  .  gabapentin (NEURONTIN) 300 MG capsule, Take 300 mg by mouth 3 (three) times daily., Disp: , Rfl:  .  metoprolol tartrate (LOPRESSOR) 25 MG tablet, Take by mouth., Disp: , Rfl:  .  cyclobenzaprine (FLEXERIL) 10 MG tablet, Take by mouth at bedtime. Reported on 12/31/2015, Disp: , Rfl:  .  ibuprofen (ADVIL,MOTRIN) 600 MG tablet, 600 mg every 4 (four) hours as needed for mild pain. Reported on 12/31/2015, Disp: , Rfl:  .  metoprolol succinate (TOPROL-XL) 50 MG 24 hr tablet, Take 50 mg by mouth daily. Reported on 12/31/2015, Disp: , Rfl:  .  pravastatin (PRAVACHOL) 20 MG tablet, Take by mouth daily. Reported on 12/31/2015, Disp: , Rfl:  .  vitamin B-12 (CYANOCOBALAMIN) 1000 MCG tablet, Take 1,000 mcg by mouth daily. Reported on 12/31/2015, Disp: , Rfl:   Past Medical History: Past Medical History  Diagnosis Date  . High blood pressure   . Prostate cancer ([place])   . High cholesterol   . 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 Arthritis   . Hyperlipemia     Tobacco Use: History  Smoking status  . Former Smoker -- 1.50 packs/day for 5 years  . Types: Cigarettes  Smokeless tobacco  . Never Used    Comment: Quit 40+ years ago    Labs: Recent Review Flowsheet Data    There is no flowsheet data to display.       Exercise Target Goals: Date: 12/31/15  Exercise Program Goal: Individual exercise prescription set with THRR, safety & activity barriers. Participant demonstrates ability to understand and report RPE using BORG  scale, to self-measure pulse accurately, and to acknowledge the importance of the exercise prescription.  Exercise Prescription Goal: Starting with aerobic activity 30 plus minutes a day, 3 days per week for initial exercise prescription. Provide home exercise prescription and guidelines that participant acknowledges understanding prior to discharge.  Activity Barriers & Risk Stratification:     Activity Barriers & Risk Stratification - 12/31/15 1336    Activity Barriers & Risk Stratification   Activity Barriers Back Problems;Neck/Spine Problems  Lumbar disc degeneration: can flare up with exercise   Risk Stratification High      6 Minute Walk:     6 Minute Walk      12/31/15 1413       6 Minute Walk   Phase Initial     Distance 1477 feet     Walk Time 6 minutes     Resting HR 67 bpm     Resting BP 126/72 mmHg     Max Ex. HR 91 bpm     Max Ex. BP 140/80 mmHg     RPE 9     Symptoms No        Initial Exercise Prescription:     Initial Exercise Prescription - 12/31/15 1400    Date of Initial Exercise Prescription   Date 12/31/15   Treadmill   MPH 2.8   Grade 0   Minutes 15  Bike   Level 1.5   Minutes 15   Recumbant Bike   Level 3   RPM 40   Minutes 15   NuStep   Level 4   Watts 50   Minutes 15   Arm Ergometer   Level 2   Watts 10   Minutes 15   Arm/Foot Ergometer   Level 2   Watts 15   Minutes 15   Cybex   Level 3   RPM 40   Minutes 15   Recumbant Elliptical   Level 2   Watts 20   Minutes 15   Elliptical   Level 1   Speed 3   Minutes 5   REL-XR   Level 4   Watts 50   Minutes 15   T5 Nustep   Level 3   Watts 30   Minutes 15   Biostep-RELP   Level 4   Watts 50   Minutes 15   Prescription Details   Frequency (times per week) 3   Duration Progress to 30 minutes of continuous aerobic without signs/symptoms of physical distress   Intensity   THRR REST +  30   Ratings of Perceived Exertion 11-15   Perceived Dyspnea 2-4    Progression Continue progressive overload as per policy without signs/symptoms or physical distress.   Resistance Training   Training Prescription Yes   Weight 2   Reps 10-15      Exercise Prescription Changes:     Exercise Prescription Changes      12/31/15 1400           Response to Exercise   Blood Pressure (Admit) 124/72 mmHg       Blood Pressure (Exercise) 140/80 mmHg       Blood Pressure (Exit) 142/78 mmHg       Heart Rate (Admit) 64 bpm       Heart Rate (Exercise) 93 bpm       Heart Rate (Exit) 68 bpm       Perceived Dyspnea (Exercise) 9          Discharge Exercise Prescription (Final Exercise Prescription Changes):     Exercise Prescription Changes - 12/31/15 1400    Response to Exercise   Blood Pressure (Admit) 124/72 mmHg   Blood Pressure (Exercise) 140/80 mmHg   Blood Pressure (Exit) 142/78 mmHg   Heart Rate (Admit) 64 bpm   Heart Rate (Exercise) 93 bpm   Heart Rate (Exit) 68 bpm   Perceived Dyspnea (Exercise) 9      Nutrition:  Target Goals: Understanding of nutrition guidelines, daily intake of sodium 1500mg , cholesterol 200mg , calories 30% from fat and 7% or less from saturated fats, daily to have 5 or more servings of fruits and vegetables.  Biometrics:     Pre Biometrics - 12/31/15 1417    Pre Biometrics   Height 5' 9.5" (1.765 m)   Weight 215 lb 1.6 oz (97.569 kg)   Waist Circumference 42 inches   Hip Circumference 44 inches   Waist to Hip Ratio 0.95 %   BMI (Calculated) 31.4       Nutrition Therapy Plan and Nutrition Goals:     Nutrition Therapy & Goals - 12/31/15 1435    Intervention Plan   Intervention Using nutrition plan and personal goals to gain a healthy nutrition lifestyle. Add exercise as prescribed.      Nutrition Discharge: Rate Your Plate Scores:   Nutrition Goals Re-Evaluation:   Psychosocial: Target Goals: Acknowledge presence  or absence of depression, maximize coping skills, provide positive support  system. Participant is able to verbalize types and ability to use techniques and skills needed for reducing stress and depression.  Initial Review & Psychosocial Screening:     Initial Psych Review & Screening - 12/31/15 1342    Initial Review   Current issues with Current Sleep Concerns  SInce surgery ,waking up and not going back to sleep.  Is doing better since discharge.   Family Dynamics   Good Support System? Yes   Barriers   Psychosocial barriers to participate in program There are no identifiable barriers or psychosocial needs.   Screening Interventions   Interventions Encouraged to exercise      Quality of Life Scores:     Quality of Life - 12/31/15 1433    Quality of Life Scores   Health/Function Pre 28.36 %   Socioeconomic Pre 28 %   Psych/Spiritual Pre 29.64 %   Family Pre 30 %   GLOBAL Pre 28.85 %      PHQ-9:     Recent Review Flowsheet Data    Depression screen [facility] 2/9 12/31/2015   Decreased Interest 0   Down, Depressed, Hopeless 0   PHQ - 2 Score 0   Altered sleeping 1   Tired, decreased energy 0   Change in appetite 0   Feeling bad or failure about yourself  0   Trouble concentrating 0   Moving slowly or fidgety/restless 0   Suicidal thoughts 0   PHQ-9 Score 1   Difficult doing work/chores Not difficult at all      Psychosocial Evaluation and Intervention:   Psychosocial Re-Evaluation:   Vocational Rehabilitation: Provide vocational rehab assistance to qualifying candidates.   Vocational Rehab Evaluation & Intervention:     Vocational Rehab - 12/31/15 1337    Initial Vocational Rehab Evaluation & Intervention   Assessment shows need for Vocational Rehabilitation No      Education: Education Goals: Education classes will be provided on a weekly basis, covering required topics. Participant will state understanding/return demonstration of topics presented.  Learning Barriers/Preferences:     Learning Barriers/Preferences -  12/31/15 1337    Learning Barriers/Preferences   Learning Barriers None   Learning Preferences None      Education Topics: General Nutrition Guidelines/Fats and Fiber: -Group instruction provided by verbal, written material, models and posters to present the general guidelines for heart healthy nutrition. Gives an explanation and review of dietary fats and fiber.   Controlling Sodium/Reading Food Labels: -Group verbal and written material supporting the discussion of sodium use in heart healthy nutrition. Review and explanation with models, verbal and written materials for utilization of the food label.   Exercise Physiology & Risk Factors: - Group verbal and written instruction with models to review the exercise physiology of the cardiovascular system and associated critical values. Details cardiovascular disease risk factors and the goals associated with each risk factor.   Aerobic Exercise & Resistance Training: - Gives group verbal and written discussion on the health impact of inactivity. On the components of aerobic and resistive training programs and the benefits of this training and how to safely progress through these programs.   Flexibility, Balance, General Exercise Guidelines: - Provides group verbal and written instruction on the benefits of flexibility and balance training programs. Provides general exercise guidelines with specific guidelines to those with heart or lung disease. Demonstration and skill practice provided.   Stress Management: - Provides group verbal and written instruction  about the health risks of elevated stress, cause of high stress, and healthy ways to reduce stress.   Depression: - Provides group verbal and written instruction on the correlation between heart/lung disease and depressed mood, treatment options, and the stigmas associated with seeking treatment.   Anatomy & Physiology of the Heart: - Group verbal and written instruction and  models provide basic cardiac anatomy and physiology, with the coronary electrical and arterial systems. Review of: AMI, Angina, Valve disease, Heart Failure, Cardiac Arrhythmia, Pacemakers, and the ICD.   Cardiac Procedures: - Group verbal and written instruction and models to describe the testing methods done to diagnose heart disease. Reviews the outcomes of the test results. Describes the treatment choices: Medical Management, Angioplasty, or Coronary Bypass Surgery.   Cardiac Medications: - Group verbal and written instruction to review commonly prescribed medications for heart disease. Reviews the medication, class of the drug, and side effects. Includes the steps to properly store meds and maintain the prescription regimen.   Go Sex-Intimacy & Heart Disease, Get SMART - Goal Setting: - Group verbal and written instruction through game format to discuss heart disease and the return to sexual intimacy. Provides group verbal and written material to discuss and apply goal setting through the application of the S.M.A.R.T. Method.   Other Matters of the Heart: - Provides group verbal, written materials and models to describe Heart Failure, Angina, Valve Disease, and Diabetes in the realm of heart disease. Includes description of the disease process and treatment options available to the cardiac patient.   Exercise & Equipment Safety: - Individual verbal instruction and demonstration of equipment use and safety with use of the equipment.          Cardiac Rehab from 12/31/2015 in [facility] Cardiac and Pulmonary Rehab   Date  12/31/15   Educator  SB   Instruction Review Code  2- meets goals/outcomes      Infection Prevention: - Provides verbal and written material to individual with discussion of infection control including proper hand washing and proper equipment cleaning during exercise session.      Cardiac Rehab from 12/31/2015 in [facility] Cardiac and Pulmonary Rehab   Date  12/31/15    Educator  SB   Instruction Review Code  2- meets goals/outcomes      Falls Prevention: - Provides verbal and written material to individual with discussion of falls prevention and safety.      Cardiac Rehab from 12/31/2015 in [facility] Cardiac and Pulmonary Rehab   Date  12/31/15   Educator  SB    Instruction Review Code  2- meets goals/outcomes      Diabetes: - Individual verbal and written instruction to review signs/symptoms of diabetes, desired ranges of glucose level fasting, after meals and with exercise. Advice that pre and post exercise glucose checks will be done for 3 sessions at entry of program.    Knowledge Questionnaire Score:     Knowledge Questionnaire Score - 12/31/15 1434    Knowledge Questionnaire Score   Pre Score 26/28      Personal Goals and Risk Factors at Admission:     Personal Goals and Risk Factors at Admission - 12/31/15 1338    Personal Goals and Risk Factors on Admission    Weight Management Yes   Intervention Learn and follow the exercise and diet guidelines while in the program. Utilize the nutrition and education classes to help gain knowledge of the diet and exercise expectations in the program   Admit Weight 215  lb (97.523 kg)   Goal Weight 200 lb (90.719 kg)   Increase Aerobic Exercise and Physical Activity No;Yes   Intervention While in program, learn and follow the exercise prescription taught. Start at a low level workload and increase workload after able to maintain previous level for 30 minutes. Increase time before increasing intensity.   Hypertension Yes   Goal Participant will see blood pressure controlled within the values of 140/3mm/Hg or within value directed by their physician.   Intervention Provide nutrition & aerobic exercise along with prescribed medications to achieve BP 140/90 or less.   Lipids Yes   Goal Cholesterol controlled with medications as prescribed, with individualized exercise RX and with personalized nutrition  plan. Value goals: LDL < 70mg , HDL > 40mg . Participant states understanding of desired cholesterol values and following prescriptions.   Intervention Provide nutrition & aerobic exercise along with prescribed medications to achieve LDL 70mg , HDL >40mg .      Personal Goals and Risk Factors Review:    Personal Goals Discharge (Final Personal Goals and Risk Factors Review):    ITP Comments:     ITP Comments      12/31/15 1436           ITP Comments Initial ITP  Continue with ITP          Comments:

## 2015-12-31 NOTE — Patient Instructions (Signed)
Patient Instructions  Patient Details  Name: Willie Burton MRN: [id number] Date of Birth: Oct 13, 1946 Referring Provider:  Teodoro Spray, MD  Below are the personal goals you chose as well as exercise and nutrition goals. Our goal is to help you keep on track towards obtaining and maintaining your goals. We will be discussing your progress on these goals with you throughout the program.  Initial Exercise Prescription:     Initial Exercise Prescription - 12/31/15 1400    Date of Initial Exercise Prescription   Date 12/31/15   Treadmill   MPH 2.8   Grade 0   Minutes 15   Bike   Level 1.5   Minutes 15   Recumbant Bike   Level 3   RPM 40   Minutes 15   NuStep   Level 4   Watts 50   Minutes 15   Arm Ergometer   Level 2   Watts 10   Minutes 15   Arm/Foot Ergometer   Level 2   Watts 15   Minutes 15   Cybex   Level 3   RPM 40   Minutes 15   Recumbant Elliptical   Level 2   Watts 20   Minutes 15   Elliptical   Level 1   Speed 3   Minutes 5   REL-XR   Level 4   Watts 50   Minutes 15   T5 Nustep   Level 3   Watts 30   Minutes 15   Biostep-RELP   Level 4   Watts 50   Minutes 15   Prescription Details   Frequency (times per week) 3   Duration Progress to 30 minutes of continuous aerobic without signs/symptoms of physical distress   Intensity   THRR REST +  30   Ratings of Perceived Exertion 11-15   Perceived Dyspnea 2-4   Progression Continue progressive overload as per policy without signs/symptoms or physical distress.   Resistance Training   Training Prescription Yes   Weight 2   Reps 10-15      Exercise Goals: Frequency: Be able to perform aerobic exercise three times per week working toward 3-5 days per week.  Intensity: Work with a perceived exertion of 11 (fairly light) - 15 (hard) as tolerated. Follow your new exercise prescription and watch for changes in prescription as you progress with the program. Changes will be reviewed with you when  they are made.  Duration: You should be able to do 30 minutes of continuous aerobic exercise in addition to a 5 minute warm-up and a 5 minute cool-down routine.  Nutrition Goals: Your personal nutrition goals will be established when you do your nutrition analysis with the dietician.  The following are nutrition guidelines to follow: Cholesterol < 200mg /day Sodium < 1500mg /day Fiber: Men over 50 yrs - 30 grams per day  Personal Goals:     Personal Goals and Risk Factors at Admission - 12/31/15 1338    Personal Goals and Risk Factors on Admission    Weight Management Yes   Intervention Learn and follow the exercise and diet guidelines while in the program. Utilize the nutrition and education classes to help gain knowledge of the diet and exercise expectations in the program   Admit Weight 215 lb (97.523 kg)   Goal Weight 200 lb (90.719 kg)   Increase Aerobic Exercise and Physical Activity No;Yes   Intervention While in program, learn and follow the exercise prescription taught. Start at a low  level workload and increase workload after able to maintain previous level for 30 minutes. Increase time before increasing intensity.   Hypertension Yes   Goal Participant will see blood pressure controlled within the values of 140/30mm/Hg or within value directed by their physician.   Intervention Provide nutrition & aerobic exercise along with prescribed medications to achieve BP 140/90 or less.   Lipids Yes   Goal Cholesterol controlled with medications as prescribed, with individualized exercise RX and with personalized nutrition plan. Value goals: LDL < 70mg , HDL > 40mg . Participant states understanding of desired cholesterol values and following prescriptions.   Intervention Provide nutrition & aerobic exercise along with prescribed medications to achieve LDL 70mg , HDL >40mg .      Tobacco Use Initial Evaluation: History  Smoking status  . Former Smoker -- 1.50 packs/day for 5 years  .  Types: Cigarettes  Smokeless tobacco  . Never Used    Comment: Quit 40+ years ago    Copy of goals given to participant.

## 2016-01-04 ENCOUNTER — Encounter: Payer: Medicare Other | Attending: Cardiology | Admitting: *Deleted

## 2016-01-04 DIAGNOSIS — Z951 Presence of aortocoronary bypass graft: Secondary | ICD-10-CM | POA: Diagnosis present

## 2016-01-04 NOTE — Progress Notes (Signed)
Daily Session Note  Patient Details  Name: Willie Burton MRN: [id number] Date of Birth: 26-Oct-1946 Referring Provider:  Teodoro Spray, MD  Encounter Date: 01/04/2016  Check In:     Session Check In - 01/04/16 0935    Check-In   Staff Present Candiss Norse, MS, ACSM CEP, Exercise Physiologist;Carroll Enterkin, RN, BSN;Susanne Bice, RN, BSN, [place]   ER physicians immediately available to respond to emergencies See telemetry face sheet for immediately available ER MD   Medication changes reported     No   Fall or balance concerns reported    No   Warm-up and Cool-down Performed on first and last piece of equipment   VAD Patient? No   Pain Assessment   Currently in Pain? No/denies   Multiple Pain Sites No           Exercise Prescription Changes - 01/04/16 0900    Response to Exercise   Symptoms None   Comments First day of exercise! Patient was oriented to the gym and the equipment functions and settings. Procedures and policies of the gym were outlined and explained. The patient's individual exercise prescription and treatment plan were reviewed with them. All starting workloads were established based on the results of the functional testing  done at the initial intake visit. The plan for exercise progression was also introduced and progression will be customized based on the patient's performance and goals.    Duration Progress to 30 minutes of continuous aerobic without signs/symptoms of physical distress   Intensity Rest + 30   Progression Continue progressive overload as per policy without signs/symptoms or physical distress.   Resistance Training   Training Prescription Yes   Weight 3   Reps 10-15      Goals Met:  Independence with exercise equipment Exercise tolerated well Personal goals reviewed No report of cardiac concerns or symptoms Strength training completed today  Goals Unmet:  Not Applicable  Goals Comments: Patient completed exercise prescription and  all exercise goals during rehab session. The exercise was tolerated well and the patient is progressing in the program.    Dr. Emily Filbert is Medical Director for [facility].

## 2016-01-08 ENCOUNTER — Encounter: Payer: Medicare Other | Admitting: *Deleted

## 2016-01-08 DIAGNOSIS — Z951 Presence of aortocoronary bypass graft: Secondary | ICD-10-CM

## 2016-01-08 NOTE — Progress Notes (Signed)
Daily Session Note  Patient Details  Name: Willie Burton MRN: [id number] Date of Birth: [birth year] Referring Provider:  Teodoro Spray, MD  Encounter Date: 01/08/2016  Check In:     Session Check In - 01/08/16 0917    Check-In   Staff Present Candiss Norse, MS, ACSM CEP, Exercise Physiologist;Steven Way, BS, ACSM EP-C, Exercise Physiologist;Diane Joya Gaskins, RN, BSN   Supervising physician immediately available to respond to emergencies See telemetry face sheet for immediately available ER MD   Medication changes reported     No   Fall or balance concerns reported    No   Warm-up and Cool-down Performed on first and last piece of equipment   Resistance Training Performed Yes   VAD Patient? No   Pain Assessment   Currently in Pain? No/denies   Multiple Pain Sites No           Exercise Prescription Changes - 01/08/16 0900    Exercise Review   Progression Yes   Response to Exercise   Symptoms None   Comments Reviewed individualized exercise prescription and made increases per departmental policy. Exercise increases were discussed with the patient and they were able to perform the new work loads without issue (no signs or symptoms).    Duration Progress to 30 minutes of continuous aerobic without signs/symptoms of physical distress   Intensity Rest + 30   Progression Continue progressive overload as per policy without signs/symptoms or physical distress.   Resistance Training   Training Prescription Yes   Weight 4   Reps 10-15   Treadmill   MPH 3.2   Grade 2   Minutes 20   Biostep-RELP   Level 4   Watts 50   Minutes 20      Goals Met:  Independence with exercise equipment Exercise tolerated well Personal goals reviewed No report of cardiac concerns or symptoms Strength training completed today  Goals Unmet:  Not Applicable  Goals Comments: Patient completed exercise prescription and all exercise goals during rehab session. The exercise was tolerated well and  the patient is progressing in the program.    Dr. Emily Filbert is Medical Director for [facility].

## 2016-01-09 ENCOUNTER — Encounter: Payer: Medicare Other | Admitting: *Deleted

## 2016-01-09 DIAGNOSIS — Z951 Presence of aortocoronary bypass graft: Secondary | ICD-10-CM | POA: Diagnosis not present

## 2016-01-09 NOTE — Progress Notes (Signed)
Daily Session Note  Patient Details  Name: Willie Burton MRN: [id number] Date of Birth: 1946/10/19 Referring Provider:  Idelle Crouch, MD  Encounter Date: 01/09/2016  Check In:     Session Check In - 01/09/16 0852    Check-In   Location [facility]   Staff Present Gerlene Burdock, RN, Drusilla Kanner, MS, ACSM CEP, Exercise Physiologist;Susanne Bice, RN, BSN, CCRP   Supervising physician immediately available to respond to emergencies See telemetry face sheet for immediately available ER MD   Medication changes reported     No   Fall or balance concerns reported    No   Warm-up and Cool-down Performed on first and last piece of equipment   Resistance Training Performed Yes   VAD Patient? No   Pain Assessment   Currently in Pain? No/denies   Multiple Pain Sites No           Exercise Prescription Changes - 01/08/16 0900    Exercise Review   Progression Yes   Response to Exercise   Symptoms None   Comments A new target heart rate range for exercise was calculated based on the patient's ability to exercise with increased intensity. The range is 40-85% of HRR and encompasses moderate-vigorous exercise. The target heart rate range is equivalent to an RPE of 12-17. HHR = 110-149 (assuming a resting HR of 75) . Reviewed individualized exercise prescription and made increases per departmental policy. Exercise increases were discussed with the patient and they were able to perform the new work loads without issue (no signs or symptoms).    Duration Progress to 30 minutes of continuous aerobic without signs/symptoms of physical distress   Intensity Other (comment)  40-85% HRR 110-149   Progression Continue progressive overload as per policy without signs/symptoms or physical distress.   Resistance Training   Training Prescription Yes   Weight 4   Reps 10-15   Treadmill   MPH 3.2   Grade 2   Minutes 20   Biostep-RELP   Level 4   Watts 50   Minutes 20       Goals Met:  Independence with exercise equipment Exercise tolerated well No report of cardiac concerns or symptoms Strength training completed today  Goals Unmet:  Not Applicable  Goals Comments: Patient completed exercise prescription and all exercise goals during rehab session. The exercise was tolerated well and the patient is progressing in the program.    Dr. Emily Filbert is Medical Director for [facility].

## 2016-01-11 ENCOUNTER — Encounter: Payer: Medicare Other | Admitting: *Deleted

## 2016-01-11 DIAGNOSIS — Z951 Presence of aortocoronary bypass graft: Secondary | ICD-10-CM

## 2016-01-11 NOTE — Progress Notes (Signed)
Daily Session Note  Patient Details  Name: Willie Burton MRN: [id number] Date of Birth: 1946/08/19 Referring Provider:  Teodoro Spray, MD  Encounter Date: 01/11/2016  Check In:     Session Check In - 01/11/16 0908    Check-In   Location [facility]   Staff Present Candiss Norse, MS, ACSM CEP, Exercise Physiologist;Carroll Enterkin, RN, BSN;Susanne Bice, RN, BSN, CCRP   Supervising physician immediately available to respond to emergencies See telemetry face sheet for immediately available ER MD   Medication changes reported     No   Fall or balance concerns reported    No   Warm-up and Cool-down Performed on first and last piece of equipment   Resistance Training Performed Yes   VAD Patient? No   Pain Assessment   Currently in Pain? No/denies   Multiple Pain Sites No           Exercise Prescription Changes - 01/11/16 0900    Exercise Review   Progression Yes   Response to Exercise   Symptoms None   Comments Reviewed individualized exercise prescription and made increases per departmental policy. Exercise increases were discussed with the patient and they were able to perform the new work loads without issue (no signs or symptoms).    Frequency Add 2 additional days to program exercise sessions.   Duration Progress to 50 minutes of aerobic without signs/symptoms of physical distress   Intensity Other (comment)  40-85% HRR 110-149   Progression Continue progressive overload as per policy without signs/symptoms or physical distress.   Resistance Training   Training Prescription Yes   Weight 4   Reps 10-15   Treadmill   MPH 3.6   Grade 2   Minutes 30   Elliptical   Level 1   Speed 3   Minutes 10   Biostep-RELP   Level 8   Watts 60   Minutes 30      Goals Met:  Independence with exercise equipment Exercise tolerated well Personal goals reviewed No report of cardiac concerns or symptoms Strength training completed today  Goals Unmet:   Not Applicable  Goals Comments: Patient completed exercise prescription and all exercise goals during rehab session. The exercise was tolerated well and the patient is progressing in the program.    Dr. Emily Filbert is Medical Director for [facility].

## 2016-01-14 ENCOUNTER — Encounter: Payer: Medicare Other | Admitting: *Deleted

## 2016-01-14 DIAGNOSIS — Z951 Presence of aortocoronary bypass graft: Secondary | ICD-10-CM

## 2016-01-14 NOTE — Progress Notes (Signed)
Daily Session Note  Patient Details  Name: Willie Burton MRN: [id number] Date of Birth: [birth year] Referring Provider:  Teodoro Spray, MD  Encounter Date: 01/14/2016  Check In:     Session Check In - 01/14/16 0827    Check-In   Location [facility]   Staff Present Candiss Norse, MS, ACSM CEP, Exercise Physiologist;Susanne Bice, RN, BSN, Laveda Norman, BS, ACSM CEP, Exercise Physiologist   Supervising physician immediately available to respond to emergencies See telemetry face sheet for immediately available ER MD   Medication changes reported     No   Fall or balance concerns reported    No   Warm-up and Cool-down Performed on first and last piece of equipment   Resistance Training Performed Yes   VAD Patient? No   Pain Assessment   Currently in Pain? No/denies   Multiple Pain Sites No         Goals Met:  Independence with exercise equipment Exercise tolerated well No report of cardiac concerns or symptoms Strength training completed today  Goals Unmet:  Not Applicable  Goals Comments: Patient completed exercise prescription and all exercise goals during rehab session. The exercise was tolerated well and the patient is progressing in the program.     Dr. Emily Filbert is Medical Director for [place].

## 2016-01-16 ENCOUNTER — Encounter: Payer: Medicare Other | Admitting: *Deleted

## 2016-01-16 DIAGNOSIS — Z951 Presence of aortocoronary bypass graft: Secondary | ICD-10-CM

## 2016-01-16 NOTE — Progress Notes (Signed)
Daily Session Note  Patient Details  Name: Willie Burton MRN: [id number] Date of Birth: May 04, 1946 Referring Provider:  Teodoro Spray, MD  Encounter Date: 01/16/2016  Check In:     Session Check In - 01/16/16 0906    Check-In   Location [facility]   Staff Present Candiss Norse, MS, ACSM CEP, Exercise Physiologist;Laureen Owens Shark, BS, RRT, Respiratory Therapist;Susanne Bice, RN, BSN, CCRP   Supervising physician immediately available to respond to emergencies See telemetry face sheet for immediately available ER MD   Medication changes reported     No   Fall or balance concerns reported    No   Warm-up and Cool-down Performed on first and last piece of equipment   Resistance Training Performed Yes   VAD Patient? No   Pain Assessment   Currently in Pain? No/denies   Multiple Pain Sites No           Exercise Prescription Changes - 01/16/16 0900    Exercise Review   Progression Yes   Response to Exercise   Symptoms None   Comments Increased Yu's resistive weights   Frequency Add 2 additional days to program exercise sessions.  Sanay continues his home ex. routine   Duration Progress to 50 minutes of aerobic without signs/symptoms of physical distress   Intensity Other (comment)  40-85% HRR 110-149   Progression Continue progressive overload as per policy without signs/symptoms or physical distress.   Resistance Training   Training Prescription Yes   Weight 7   Reps 10-15   Treadmill   MPH 3.6   Grade 2   Minutes 30   Elliptical   Level 1   Speed 3   Minutes 10   Biostep-RELP   Level 8   Watts 60   Minutes 30      Goals Met:  Independence with exercise equipment Exercise tolerated well Personal goals reviewed No report of cardiac concerns or symptoms Strength training completed today  Goals Unmet:  Not Applicable  Goals Comments: Patient completed exercise prescription and all exercise goals during rehab session. The exercise  was tolerated well and the patient is progressing in the program.    Dr. Emily Filbert is Medical Director for [facility].

## 2016-01-18 ENCOUNTER — Encounter: Payer: Medicare Other | Admitting: *Deleted

## 2016-01-18 DIAGNOSIS — Z951 Presence of aortocoronary bypass graft: Secondary | ICD-10-CM

## 2016-01-18 NOTE — Progress Notes (Signed)
Daily Session Note  Patient Details  Name: Willie Burton MRN: [id number] Date of Birth: [birth year] Referring Provider:  Teodoro Spray, MD  Encounter Date: 01/18/2016  Check In:     Session Check In - 01/18/16 0843    Check-In   Location [facility]   Staff Present Heath Lark, RN, BSN, CCRP;Macyn Shropshire, RN, Drusilla Kanner, MS, ACSM CEP, Exercise Physiologist   Supervising physician immediately available to respond to emergencies See telemetry face sheet for immediately available ER MD   Medication changes reported     No   Fall or balance concerns reported    No   Warm-up and Cool-down Performed on first and last piece of equipment   Resistance Training Performed Yes   VAD Patient? No   Pain Assessment   Currently in Pain? No/denies         Goals Met:  Proper associated with RPD/PD & O2 Sat Exercise tolerated well  Goals Unmet:  Not Applicable  Goals Comments:    Dr. Emily Filbert is Medical Director for [place] [facility].

## 2016-01-21 ENCOUNTER — Encounter: Payer: Medicare Other | Admitting: *Deleted

## 2016-01-21 DIAGNOSIS — Z951 Presence of aortocoronary bypass graft: Secondary | ICD-10-CM | POA: Diagnosis not present

## 2016-01-21 NOTE — Progress Notes (Signed)
Daily Session Note  Patient Details  Name: Willie Burton MRN: [id number] Date of Birth: 1946/06/08 Referring Provider:  Teodoro Spray, MD  Encounter Date: 01/21/2016  Check In:     Session Check In - 01/21/16 0834    Check-In   Location [facility]   Staff Present Candiss Norse, MS, ACSM CEP, Exercise Physiologist;Susanne Bice, RN, BSN, Laveda Norman, BS, ACSM CEP, Exercise Physiologist   Supervising physician immediately available to respond to emergencies See telemetry face sheet for immediately available ER MD   Medication changes reported     No   Fall or balance concerns reported    No   Warm-up and Cool-down Performed on first and last piece of equipment   Resistance Training Performed Yes   VAD Patient? No   Pain Assessment   Currently in Pain? No/denies   Multiple Pain Sites No         Goals Met:  Independence with exercise equipment Exercise tolerated well No report of cardiac concerns or symptoms Strength training completed today  Goals Unmet:  Not Applicable  Goals Comments: Patient completed exercise prescription and all exercise goals during rehab session. The exercise was tolerated well and the patient is progressing in the program.     Dr. Emily Filbert is Medical Director for [place] [facility].

## 2016-01-22 NOTE — Progress Notes (Signed)
Cardiac Individual Treatment Plan  Patient Details  Name: Willie Burton MRN: [id number] Date of Birth: 05/11/1946 Referring Provider:  Teodoro Spray, MD  Initial Encounter Date:    Visit Diagnosis: S/P CABG x 3  Patient's Home Medications on Admission:  Current outpatient prescriptions:  .  aspirin EC 81 MG tablet, Take by mouth daily. , Disp: , Rfl:  .  atorvastatin (LIPITOR) 40 MG tablet, Take 40 mg by mouth daily., Disp: , Rfl:  .  cyclobenzaprine (FLEXERIL) 10 MG tablet, Take by mouth at bedtime. Reported on 12/31/2015, Disp: , Rfl:  .  gabapentin (NEURONTIN) 300 MG capsule, Take 300 mg by mouth 3 (three) times daily., Disp: , Rfl:  .  ibuprofen (ADVIL,MOTRIN) 600 MG tablet, 600 mg every 4 (four) hours as needed for mild pain. Reported on 12/31/2015, Disp: , Rfl:  .  metoprolol succinate (TOPROL-XL) 50 MG 24 hr tablet, Take 50 mg by mouth daily. Reported on 12/31/2015, Disp: , Rfl:  .  metoprolol tartrate (LOPRESSOR) 25 MG tablet, Take by mouth., Disp: , Rfl:  .  pravastatin (PRAVACHOL) 20 MG tablet, Take by mouth daily. Reported on 12/31/2015, Disp: , Rfl:  .  vitamin B-12 (CYANOCOBALAMIN) 1000 MCG tablet, Take 1,000 mcg by mouth daily. Reported on 12/31/2015, Disp: , Rfl:   Past Medical History: Past Medical History  Diagnosis Date  . High blood pressure   . Prostate cancer ([employer])   . High cholesterol   . 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 Arthritis   . Hyperlipemia     Tobacco Use: History  Smoking status  . Former Smoker -- 1.50 packs/day for 5 years  . Types: Cigarettes  Smokeless tobacco  . Never Used    Comment: Quit 40+ years ago    Labs: Recent Review Flowsheet Data    There is no flowsheet data to display.       Exercise Target Goals:    Exercise Program Goal: Individual exercise prescription set with THRR, safety & activity barriers. Participant demonstrates ability to understand and report RPE using BORG scale, to self-measure pulse  accurately, and to acknowledge the importance of the exercise prescription.  Exercise Prescription Goal: Starting with aerobic activity 30 plus minutes a day, 3 days per week for initial exercise prescription. Provide home exercise prescription and guidelines that participant acknowledges understanding prior to discharge.  Activity Barriers & Risk Stratification:     Activity Barriers & Cardiac Risk Stratification - 12/31/15 1336    Activity Barriers & Cardiac Risk Stratification   Activity Barriers Back Problems;Neck/Spine Problems  Lumbar disc degeneration: can flare up with exercise   Cardiac Risk Stratification High      6 Minute Walk:     6 Minute Walk      12/31/15 1413       6 Minute Walk   Phase Initial     Distance 1477 feet     Walk Time 6 minutes     RPE 9     Symptoms No     Resting HR 67 bpm     Resting BP 126/72 mmHg     Max Ex. HR 91 bpm     Max Ex. BP 140/80 mmHg        Initial Exercise Prescription:     Initial Exercise Prescription - 12/31/15 1400    Date of Initial Exercise Prescription   Date 12/31/15   Treadmill   MPH 2.8   Grade 0  Minutes 15   Bike   Level 1.5   Minutes 15   Recumbant Bike   Level 3   RPM 40   Minutes 15   NuStep   Level 4   Watts 50   Minutes 15   Arm Ergometer   Level 2   Watts 10   Minutes 15   Arm/Foot Ergometer   Level 2   Watts 15   Minutes 15   Cybex   Level 3   RPM 40   Minutes 15   Recumbant Elliptical   Level 2   Watts 20   Minutes 15   Elliptical   Level 1   Speed 3   Minutes 5   REL-XR   Level 4   Watts 50   Minutes 15   T5 Nustep   Level 3   Watts 30   Minutes 15   Biostep-RELP   Level 4   Watts 50   Minutes 15   Prescription Details   Frequency (times per week) 3   Duration Progress to 30 minutes of continuous aerobic without signs/symptoms of physical distress   Intensity   THRR REST +  30   Ratings of Perceived Exertion 11-15   Perceived Dyspnea 2-4   Progression  Continue progressive overload as per policy without signs/symptoms or physical distress.   Resistance Training   Training Prescription Yes   Weight 2   Reps 10-15      Exercise Prescription Changes:     Exercise Prescription Changes      12/31/15 1400 01/04/16 0900 01/08/16 0900 01/11/16 0900 01/14/16 1500   Exercise Review   Progression   Yes Yes Yes   Response to Exercise   Blood Pressure (Admit) 124/72 mmHg    120/72 mmHg   Blood Pressure (Exercise) 140/80 mmHg    154/70 mmHg   Blood Pressure (Exit) 142/78 mmHg    132/74 mmHg   Heart Rate (Admit) 64 bpm    78 bpm   Heart Rate (Exercise) 93 bpm    118 bpm   Heart Rate (Exit) 68 bpm    90 bpm   Perceived Dyspnea (Exercise) 9       Symptoms  None None None None   Comments  First day of exercise! Patient was oriented to the gym and the equipment functions and settings. Procedures and policies of the gym were outlined and explained. The patient's individual exercise prescription and treatment plan were reviewed with them. All starting workloads were established based on the results of the functional testing  done at the initial intake visit. The plan for exercise progression was also introduced and progression will be customized based on the patient's performance and goals.  A new target heart rate range for exercise was calculated based on the patient's ability to exercise with increased intensity. The range is 40-85% of HRR and encompasses moderate-vigorous exercise. The target heart rate range is equivalent to an RPE of 12-17. HHR = 110-149 (assuming a resting HR of 75) . Reviewed individualized exercise prescription and made increases per departmental policy. Exercise increases were discussed with the patient and they were able to perform the new work loads without issue (no signs or symptoms).  Reviewed individualized exercise prescription and made increases per departmental policy. Exercise increases were discussed with the patient and  they were able to perform the new work loads without issue (no signs or symptoms).  [name] has been progressing through exercise mode and work level. He  has now added the elliptical for 10 minutes in additon to his treadmill walking   Frequency    Add 2 additional days to program exercise sessions. Add 2 additional days to program exercise sessions.  Clanton continues his home ex. routine   Duration  Progress to 30 minutes of continuous aerobic without signs/symptoms of physical distress Progress to 30 minutes of continuous aerobic without signs/symptoms of physical distress Progress to 50 minutes of aerobic without signs/symptoms of physical distress Progress to 50 minutes of aerobic without signs/symptoms of physical distress   Intensity  Rest + 30 Other (comment)  40-85% HRR 110-149 Other (comment)  40-85% HRR 110-149 Other (comment)  40-85% HRR 110-149   Progression  Continue progressive overload as per policy without signs/symptoms or physical distress. Continue progressive overload as per policy without signs/symptoms or physical distress. Continue progressive overload as per policy without signs/symptoms or physical distress. Continue progressive overload as per policy without signs/symptoms or physical distress.   Resistance Training   Training Prescription  Yes Yes Yes Yes   Weight  _0 Reps  10-15 10-15 10-15 10-15   Treadmill   MPH   3.2 3.6 3.6   Grade   _1 Minutes   _2 Elliptical   Level    1 1   Speed    3 3   Minutes    10 10   Biostep-RELP   Level   _3 Watts   50 60 60   Minutes   _4 01/16/16 0900           Exercise Review   Progression Yes       Response to Exercise   Symptoms None       Comments Increased Ladarrius's resistive weights       Frequency Add 2 additional days to program exercise sessions.  Dartanian continues his home ex. routine       Duration Progress to 50 minutes of aerobic without signs/symptoms of physical distress        Intensity Other (comment)  40-85% HRR 110-149       Progression Continue progressive overload as per policy without signs/symptoms or physical distress.       Resistance Training   Training Prescription Yes       Weight 7       Reps 10-15       Treadmill   MPH 3.6       Grade 2       Minutes 30       Elliptical   Level 1       Speed 3       Minutes 10       Biostep-RELP   Level 8       Watts 60       Minutes 30          Discharge Exercise Prescription (Final Exercise Prescription Changes):     Exercise Prescription Changes - 01/16/16 0900    Exercise Review   Progression Yes   Response to Exercise   Symptoms None   Comments Increased Liron's resistive weights   Frequency Add 2 additional days to program exercise sessions.  Jaikob continues his home ex. routine   Duration Progress to 50 minutes of aerobic without signs/symptoms of physical distress   Intensity Other (comment)  40-85% HRR 110-149   Progression Continue  progressive overload as per policy without signs/symptoms or physical distress.   Resistance Training   Training Prescription Yes   Weight 7   Reps 10-15   Treadmill   MPH 3.6   Grade 2   Minutes 30   Elliptical   Level 1   Speed 3   Minutes 10   Biostep-RELP   Level 8   Watts 60   Minutes 30      Nutrition:  Target Goals: Understanding of nutrition guidelines, daily intake of sodium <1561m, cholesterol <2059m calories 30% from fat and 7% or less from saturated fats, daily to have 5 or more servings of fruits and vegetables.  Biometrics:     Pre Biometrics - 12/31/15 1417    Pre Biometrics   Height 5' 9.5" (1.765 m)   Weight 215 lb 1.6 oz (97.569 kg)   Waist Circumference 42 inches   Hip Circumference 44 inches   Waist to Hip Ratio 0.95 %   BMI (Calculated) 31.4       Nutrition Therapy Plan and Nutrition Goals:     Nutrition Therapy & Goals - 01/14/16 1404    Nutrition Therapy   Diet Instructed on a heart healthy meal  plan based on 1900 calories including DASH diet principles,    Drug/Food Interactions Statins/Certain Fruits   Fiber 20 grams   Whole Grain Foods 3 servings   Protein 8 ounces/day   Saturated Fats 13 max. grams   Fruits and Vegetables 5 servings/day   Personal Nutrition Goals   Personal Goal #1 Try thin sliced cheese to help decrease intake of saturated fat.   Personal Goal #2 Increase whole grain servings with goal of at least 3 servings per day.   Personal Goal #3 Read labels for saturated fat, trans fat and sodium.   Comments Patient has made significant positive changes in his diet since his CABG surgery       Nutrition Discharge: Rate Your Plate Scores:     Rate Your Plate - 0216/10/9640454  Rate Your Plate Scores   Pre Score 78   Pre Score % 87 %      Nutrition Goals Re-Evaluation:   Psychosocial: Target Goals: Acknowledge presence or absence of depression, maximize coping skills, provide positive support system. Participant is able to verbalize types and ability to use techniques and skills needed for reducing stress and depression.  Initial Review & Psychosocial Screening:     Initial Psych Review & Screening - 12/31/15 1342    Initial Review   Current issues with Current Sleep Concerns  SInce surgery ,waking up and not going back to sleep.  Is doing better since discharge.   Family Dynamics   Good Support System? Yes   Barriers   Psychosocial barriers to participate in program There are no identifiable barriers or psychosocial needs.   Screening Interventions   Interventions Encouraged to exercise      Quality of Life Scores:     Quality of Life - 12/31/15 1433    Quality of Life Scores   Health/Function Pre 28.36 %   Socioeconomic Pre 28 %   Psych/Spiritual Pre 29.64 %   Family Pre 30 %   GLOBAL Pre 28.85 %      PHQ-9:     Recent Review Flowsheet Data    [facility] 12/31/2015   Decreased Interest 0   Down, Depressed, Hopeless 0    PHQ - 2 Score 0   Altered sleeping 1  Tired, decreased energy 0   Change in appetite 0   Feeling bad or failure about yourself  0   Trouble concentrating 0   Moving slowly or fidgety/restless 0   Suicidal thoughts 0   PHQ-9 Score 1   Difficult doing work/chores Not difficult at all      Psychosocial Evaluation and Intervention:     Psychosocial Evaluation - 01/14/16 0955    Psychosocial Evaluation & Interventions   Interventions Encouraged to exercise with the program and follow exercise prescription   Comments Counselor met with Mr. Gonzales today for initial psychosocial evaluation.  He is a 70 year old retired gentleman who had bypass surgery on 11/20/15.  He has a strong support system with a spouse of 75 years and active involvement in his local church community.  Mr. Genova reports sleeping well and has a good appetite.  He denies a history of depression or anxiety or any current symptoms.  He is typically in a positive mood and has been working out consistently in [employer] class for quite some time along with his spouse.  He has goals to increase his stamina and strength while in this program & be able to follow up with [place] 3x/week.        Psychosocial Re-Evaluation:   Vocational Rehabilitation: Provide vocational rehab assistance to qualifying candidates.   Vocational Rehab Evaluation & Intervention:     Vocational Rehab - 12/31/15 1337    Initial Vocational Rehab Evaluation & Intervention   Assessment shows need for Vocational Rehabilitation No      Education: Education Goals: Education classes will be provided on a weekly basis, covering required topics. Participant will state understanding/return demonstration of topics presented.  Learning Barriers/Preferences:     Learning Barriers/Preferences - 12/31/15 1337    Learning Barriers/Preferences   Learning Barriers None   Learning Preferences None      Education Topics: General Nutrition  Guidelines/Fats and Fiber: -Group instruction provided by verbal, written material, models and posters to present the general guidelines for heart healthy nutrition. Gives an explanation and review of dietary fats and fiber.          Cardiac Rehab from 01/21/2016 in [facility] Cardiac and Pulmonary Rehab   Date  01/08/16   Educator  CR   Instruction Review Code  2- meets goals/outcomes      Controlling Sodium/Reading Food Labels: -Group verbal and written material supporting the discussion of sodium use in heart healthy nutrition. Review and explanation with models, verbal and written materials for utilization of the food label.      Cardiac Rehab from 01/21/2016 in [facility] Cardiac and Pulmonary Rehab   Date  01/14/16   Educator  CR   Instruction Review Code  2- meets goals/outcomes      Exercise Physiology & Risk Factors: - Group verbal and written instruction with models to review the exercise physiology of the cardiovascular system and associated critical values. Details cardiovascular disease risk factors and the goals associated with each risk factor.   Aerobic Exercise & Resistance Training: - Gives group verbal and written discussion on the health impact of inactivity. On the components of aerobic and resistive training programs and the benefits of this training and how to safely progress through these programs.   Flexibility, Balance, General Exercise Guidelines: - Provides group verbal and written instruction on the benefits of flexibility and balance training programs. Provides general exercise guidelines with specific guidelines to those with heart or lung disease. Demonstration  and skill practice provided.   Stress Management: - Provides group verbal and written instruction about the health risks of elevated stress, cause of high stress, and healthy ways to reduce stress.   Depression: - Provides group verbal and written instruction on the correlation between heart/lung disease  and depressed mood, treatment options, and the stigmas associated with seeking treatment.   Anatomy & Physiology of the Heart: - Group verbal and written instruction and models provide basic cardiac anatomy and physiology, with the coronary electrical and arterial systems. Review of: AMI, Angina, Valve disease, Heart Failure, Cardiac Arrhythmia, Pacemakers, and the ICD.   Cardiac Procedures: - Group verbal and written instruction and models to describe the testing methods done to diagnose heart disease. Reviews the outcomes of the test results. Describes the treatment choices: Medical Management, Angioplasty, or Coronary Bypass Surgery.      Cardiac Rehab from 01/21/2016 in [facility] Cardiac and Pulmonary Rehab   Date  01/21/16   Educator  SB   Instruction Review Code  2- meets goals/outcomes      Cardiac Medications: - Group verbal and written instruction to review commonly prescribed medications for heart disease. Reviews the medication, class of the drug, and side effects. Includes the steps to properly store meds and maintain the prescription regimen.      Cardiac Rehab from 01/21/2016 in [facility] Cardiac and Pulmonary Rehab   Date  01/09/16   Educator  SB   Instruction Review Code  2- meets goals/outcomes      Go Sex-Intimacy & Heart Disease, Get SMART - Goal Setting: - Group verbal and written instruction through game format to discuss heart disease and the return to sexual intimacy. Provides group verbal and written material to discuss and apply goal setting through the application of the S.M.A.R.T. Method.      Cardiac Rehab from 01/21/2016 in [facility] Cardiac and Pulmonary Rehab   Date  01/21/16   Educator  SB   Instruction Review Code  2- meets goals/outcomes      Other Matters of the Heart: - Provides group verbal, written materials and models to describe Heart Failure, Angina, Valve Disease, and Diabetes in the realm of heart disease. Includes description of the disease process and  treatment options available to the cardiac patient.   Exercise & Equipment Safety: - Individual verbal instruction and demonstration of equipment use and safety with use of the equipment.      Cardiac Rehab from 01/21/2016 in [facility] Cardiac and Pulmonary Rehab   Date  12/31/15   Educator  SB   Instruction Review Code  2- meets goals/outcomes      Infection Prevention: - Provides verbal and written material to individual with discussion of infection control including proper hand washing and proper equipment cleaning during exercise session.      Cardiac Rehab from 01/21/2016 in [facility] Cardiac and Pulmonary Rehab   Date  12/31/15   Educator  SB   Instruction Review Code  2- meets goals/outcomes      Falls Prevention: - Provides verbal and written material to individual with discussion of falls prevention and safety.      Cardiac Rehab from 01/21/2016 in [facility] Cardiac and Pulmonary Rehab   Date  12/31/15   Educator  SB    Instruction Review Code  2- meets goals/outcomes      Diabetes: - Individual verbal and written instruction to review signs/symptoms of diabetes, desired ranges of glucose level fasting, after meals and with exercise. Advice that pre and  post exercise glucose checks will be done for 3 sessions at entry of program.    Knowledge Questionnaire Score:     Knowledge Questionnaire Score - 12/31/15 1434    Knowledge Questionnaire Score   Pre Score 26/28      Personal Goals and Risk Factors at Admission:     Personal Goals and Risk Factors at Admission - 12/31/15 1338    Personal Goals and Risk Factors on Admission    Weight Management Yes   Intervention Learn and follow the exercise and diet guidelines while in the program. Utilize the nutrition and education classes to help gain knowledge of the diet and exercise expectations in the program   Admit Weight 215 lb (97.523 kg)   Goal Weight 200 lb (90.719 kg)   Increase Aerobic Exercise and Physical Activity No;Yes    Intervention While in program, learn and follow the exercise prescription taught. Start at a low level workload and increase workload after able to maintain previous level for 30 minutes. Increase time before increasing intensity.   Hypertension Yes   Goal Participant will see blood pressure controlled within the values of 140/50m/Hg or within value directed by their physician.   Intervention Provide nutrition & aerobic exercise along with prescribed medications to achieve BP 140/90 or less.   Lipids Yes   Goal Cholesterol controlled with medications as prescribed, with individualized exercise RX and with personalized nutrition plan. Value goals: LDL < 780m HDL > 4040mParticipant states understanding of desired cholesterol values and following prescriptions.   Intervention Provide nutrition & aerobic exercise along with prescribed medications to achieve LDL <50m58mDL >40mg60m   Personal Goals and Risk Factors Review:      Goals and Risk Factor Review      01/14/16 1505           Increase Aerobic Exercise and Physical Activity   Goals Progress/Improvement seen  Yes       Comments WayneCoraprogressed up to the full aerobic exercise time offered in class and requires no breaks. Efforts in progression are now focused on intensity through increases in worload. WayneDakes that he wants to be challenged during the program and reallly wants to exert himself. His TM and BioStep workloads have been increased and he has recently added the elliptical which is very challenging for him. We will slowly add time to the elliptical as this is the hardest machine for him currently. We will followup with him in the coming sessions to provide increases to the ellipitical .           Personal Goals Discharge (Final Personal Goals and Risk Factors Review):      Goals and Risk Factor Review - 01/14/16 1505    Increase Aerobic Exercise and Physical Activity   Goals Progress/Improvement seen  Yes    Comments WayneJerimyprogressed up to the full aerobic exercise time offered in class and requires no breaks. Efforts in progression are now focused on intensity through increases in worload. WayneRishanes that he wants to be challenged during the program and reallly wants to exert himself. His TM and BioStep workloads have been increased and he has recently added the elliptical which is very challenging for him. We will slowly add time to the elliptical as this is the hardest machine for him currently. We will followup with him in the coming sessions to provide increases to the ellipitical .       ITP Comments:  ITP Comments      12/31/15 1436 01/22/16 0842         ITP Comments Initial ITP  Continue with ITP 30 Day Review.  Continue with ITP.         Comments:

## 2016-01-23 DIAGNOSIS — Z951 Presence of aortocoronary bypass graft: Secondary | ICD-10-CM | POA: Diagnosis not present

## 2016-01-23 NOTE — Progress Notes (Signed)
Daily Session Note  Patient Details  Name: Willie Burton MRN: [id number] Date of Birth: 01/28/1946 Referring Provider:  Teodoro Spray, MD  Encounter Date: 01/23/2016  Check In:     Session Check In - 01/23/16 0855    Check-In   Location [facility]-Cardiac & Pulmonary Rehab   Staff Present Heath Lark, RN, BSN, CCRP;Renee Dillard Essex, MS, ACSM CEP, Exercise Physiologist;Robley Matassa, BS, ACSM EP-C, Exercise Physiologist   Supervising physician immediately available to respond to emergencies See telemetry face sheet for immediately available ER MD   Medication changes reported     No   Fall or balance concerns reported    No   Warm-up and Cool-down Performed on first and last piece of equipment   Resistance Training Performed No   VAD Patient? No   Pain Assessment   Currently in Pain? No/denies         Goals Met:  Proper associated with RPD/PD & O2 Sat Exercise tolerated well No report of cardiac concerns or symptoms Strength training completed today  Goals Unmet:  Not Applicable  Goals Comments:    Dr. Emily Filbert is Medical Director for [facility].

## 2016-01-28 ENCOUNTER — Encounter: Payer: Medicare Other | Admitting: *Deleted

## 2016-01-28 DIAGNOSIS — Z951 Presence of aortocoronary bypass graft: Secondary | ICD-10-CM | POA: Diagnosis not present

## 2016-01-28 NOTE — Progress Notes (Signed)
Daily Session Note  Patient Details  Name: Willie Burton MRN: [id number] Date of Birth: 1946/04/13 Referring Provider:  Teodoro Spray, MD  Encounter Date: 01/28/2016  Check In:     Session Check In - 01/28/16 0825    Check-In   Location [facility]   Staff Present Candiss Norse, MS, ACSM CEP, Exercise Physiologist;Makyle Eslick Amedeo Plenty, BS, ACSM CEP, Exercise Physiologist;Carroll Enterkin, RN, BSN   Supervising physician immediately available to respond to emergencies See telemetry face sheet for immediately available ER MD   Medication changes reported     No   Fall or balance concerns reported    No   Warm-up and Cool-down Performed on first and last piece of equipment   Resistance Training Performed Yes   VAD Patient? No   Pain Assessment   Currently in Pain? No/denies   Multiple Pain Sites No           Exercise Prescription Changes - 01/28/16 0800    Exercise Review   Progression Yes   Response to Exercise   Symptoms None   Comments [name] reports that he is walking at home on most days when he is not in class. He reports that he has tolerated this home exercise well and has had no signs or symptoms.    Frequency Add 2 additional days to program exercise sessions.  [name] continues his home ex. routine   Duration Progress to 50 minutes of aerobic without signs/symptoms of physical distress   Intensity Other (comment)  40-85% HRR 110-149   Progression Continue progressive overload as per policy without signs/symptoms or physical distress.   Resistance Training   Training Prescription Yes   Weight 7   Reps 10-15   Treadmill   MPH 3.6   Grade 2   Minutes 30   Elliptical   Level 1   Speed 3   Minutes 10   Biostep-RELP   Level 8   Watts 60   Minutes 30      Goals Met:  Independence with exercise equipment Exercise tolerated well Personal goals reviewed No report of cardiac concerns or symptoms Strength training completed today  Goals  Unmet:  Not Applicable  Goals Comments: Patient completed exercise prescription and all exercise goals during rehab session. The exercise was tolerated well and the patient is progressing in the program.     Dr. Emily Filbert is Medical Director for [place] and [facility].

## 2016-01-30 ENCOUNTER — Encounter: Payer: Medicare Other | Attending: Cardiology | Admitting: *Deleted

## 2016-01-30 DIAGNOSIS — Z951 Presence of aortocoronary bypass graft: Secondary | ICD-10-CM | POA: Diagnosis not present

## 2016-01-30 NOTE — Progress Notes (Signed)
Daily Session Note  Patient Details  Name: GEOFFERY AULTMAN MRN: [id number] Date of Birth: [birth year] Referring Provider:  Teodoro Spray, MD  Encounter Date: 01/30/2016  Check In:     Session Check In - 01/30/16 0846    Check-In   Staff Present Nyoka Cowden, RN;Susanne Bice, RN, BSN, CCRP;Steven Way, BS, ACSM EP-C, Exercise Physiologist   Supervising physician immediately available to respond to emergencies See telemetry face sheet for immediately available ER MD   Medication changes reported     No   Fall or balance concerns reported    No   Warm-up and Cool-down Performed on first and last piece of equipment   Resistance Training Performed No   VAD Patient? No   Pain Assessment   Currently in Pain? No/denies         Goals Met:  Independence with exercise equipment Exercise tolerated well No report of cardiac concerns or symptoms  Goals Unmet:  Not Applicable Doing well with exercise prescription progression.      Dr. Emily Filbert is Medical Director for [place] and [facility].

## 2016-01-30 NOTE — Addendum Note (Signed)
Addended by: Lynford Humphrey on: 01/30/2016 07:02 AM   Modules accepted: Orders

## 2016-02-04 DIAGNOSIS — Z951 Presence of aortocoronary bypass graft: Secondary | ICD-10-CM

## 2016-02-04 NOTE — Progress Notes (Signed)
Daily Session Note  Patient Details  Name: Willie Burton MRN: [id number] Date of Birth: December 24, 1945 Referring Provider:  Teodoro Spray, MD  Encounter Date: 02/04/2016  Check In:     Session Check In - 02/04/16 0839    Check-In   Location [facility]   Staff Present Earlean Shawl, BS, ACSM CEP, Exercise Physiologist;Susanne Bice, RN, BSN, CCRP;Shuayb Schepers, PT   Supervising physician immediately available to respond to emergencies See telemetry face sheet for immediately available ER MD   Medication changes reported     No   Fall or balance concerns reported    No   Warm-up and Cool-down Performed on first and last piece of equipment   Resistance Training Performed Yes   VAD Patient? No   Pain Assessment   Currently in Pain? No/denies         Goals Met:  Independence with exercise equipment Exercise tolerated well No report of cardiac concerns or symptoms  Goals Unmet:  Not Applicable  Comments: Patient completed exercise prescription and all exercise goals during rehab session. The exercise was tolerated well and the patient is progressing in the program.    Dr. Emily Filbert is Medical Director for [place] and [facility].

## 2016-02-06 ENCOUNTER — Encounter: Payer: Medicare Other | Admitting: *Deleted

## 2016-02-06 DIAGNOSIS — Z951 Presence of aortocoronary bypass graft: Secondary | ICD-10-CM | POA: Diagnosis not present

## 2016-02-06 NOTE — Progress Notes (Signed)
Daily Session Note  Patient Details  Name: Willie Burton MRN: [id number] Date of Birth: [birth year] Referring Provider:  Idelle Crouch, MD  Encounter Date: 02/06/2016  Check In:     Session Check In - 02/06/16 0831    Check-In   Location [facility]   Staff Present Heath Lark, RN, BSN, CCRP;Renee Dillard Essex, MS, ACSM CEP, Exercise Physiologist;Willet Schleifer, BS, ACSM EP-C, Exercise Physiologist   Supervising physician immediately available to respond to emergencies See telemetry face sheet for immediately available ER MD   Medication changes reported     No   Fall or balance concerns reported    No   Warm-up and Cool-down Performed on first and last piece of equipment   Resistance Training Performed No   VAD Patient? No   Pain Assessment   Currently in Pain? No/denies         Goals Met:  Proper associated with RPD/PD & O2 Sat Exercise tolerated well No report of cardiac concerns or symptoms Strength training completed today  Goals Unmet:  Not Applicable  Comments:   Dr. Emily Filbert is Medical Director for [place] and [facility].

## 2016-02-11 ENCOUNTER — Encounter: Payer: Medicare Other | Admitting: *Deleted

## 2016-02-11 DIAGNOSIS — Z951 Presence of aortocoronary bypass graft: Secondary | ICD-10-CM | POA: Diagnosis not present

## 2016-02-11 NOTE — Progress Notes (Signed)
Daily Session Note  Patient Details  Name: Willie Burton MRN: [id number] Date of Birth: 1946/11/21 Referring Provider:  Teodoro Spray, MD  Encounter Date: 02/11/2016  Check In:     Session Check In - 02/11/16 0859    Check-In   Location [facility]   Staff Present Candiss Norse, MS, ACSM CEP, Exercise Physiologist;Susanne Bice, RN, BSN, Laveda Norman, BS, ACSM CEP, Exercise Physiologist   Supervising physician immediately available to respond to emergencies See telemetry face sheet for immediately available ER MD   Medication changes reported     No   Fall or balance concerns reported    No   Warm-up and Cool-down Performed on first and last piece of equipment   Resistance Training Performed Yes   VAD Patient? No   Pain Assessment   Currently in Pain? No/denies   Multiple Pain Sites No         Goals Met:  Independence with exercise equipment Exercise tolerated well No report of cardiac concerns or symptoms Strength training completed today  Goals Unmet:  Not Applicable  Comments: Patient completed exercise prescription and all exercise goals during rehab session. The exercise was tolerated well and the patient is progressing in the program.    Dr. Emily Filbert is Medical Director for [place] and [facility].

## 2016-02-13 DIAGNOSIS — Z951 Presence of aortocoronary bypass graft: Secondary | ICD-10-CM | POA: Diagnosis not present

## 2016-02-13 NOTE — Progress Notes (Signed)
Daily Session Note  Patient Details  Name: Willie Burton MRN: [id number] Date of Birth: 1946-09-01 Referring Provider:  Teodoro Spray, MD  Encounter Date: 02/13/2016  Check In:     Session Check In - 02/13/16 0827    Check-In   Location [facility]   Staff Present Heath Lark, RN, BSN, CCRP;Renee Dillard Essex, MS, ACSM CEP, Exercise Physiologist;Lexington Devine, BS, ACSM EP-C, Exercise Physiologist   Supervising physician immediately available to respond to emergencies See telemetry face sheet for immediately available ER MD   Medication changes reported     No   Fall or balance concerns reported    No   Warm-up and Cool-down Performed on first and last piece of equipment   Resistance Training Performed No   VAD Patient? No   Pain Assessment   Currently in Pain? No/denies         Goals Met:  Proper associated with RPD/PD & O2 Sat Exercise tolerated well No report of cardiac concerns or symptoms Strength training completed today  Goals Unmet:  Not Applicable  Comments:   Dr. Emily Filbert is Medical Director for [place] and [facility].

## 2016-02-15 ENCOUNTER — Encounter: Payer: Medicare Other | Admitting: *Deleted

## 2016-02-15 DIAGNOSIS — Z951 Presence of aortocoronary bypass graft: Secondary | ICD-10-CM

## 2016-02-15 NOTE — Progress Notes (Signed)
Daily Session Note  Patient Details  Name: Willie Burton MRN: [id number] Date of Birth: 1946/06/26 Referring Provider:  Idelle Crouch, MD  Encounter Date: 02/15/2016  Check In:     Session Check In - 02/15/16 0816    Check-In   Location [facility]   Staff Present Candiss Norse, MS, ACSM CEP, Exercise Physiologist;Carroll Enterkin, RN, BSN;Susanne Bice, RN, BSN, CCRP   Supervising physician immediately available to respond to emergencies See telemetry face sheet for immediately available ER MD   Medication changes reported     No   Fall or balance concerns reported    No   Warm-up and Cool-down Performed on first and last piece of equipment   Resistance Training Performed Yes   VAD Patient? No   Pain Assessment   Currently in Pain? No/denies   Multiple Pain Sites No         Goals Met:  Independence with exercise equipment Exercise tolerated well No report of cardiac concerns or symptoms Strength training completed today  Goals Unmet:  Not Applicable  Comments: Patient completed exercise prescription and all exercise goals during rehab session. The exercise was tolerated well and the patient is progressing in the program.    Dr. Emily Filbert is Medical Director for [place] and [facility].

## 2016-02-20 ENCOUNTER — Encounter: Payer: Medicare Other | Admitting: *Deleted

## 2016-02-20 DIAGNOSIS — Z951 Presence of aortocoronary bypass graft: Secondary | ICD-10-CM

## 2016-02-20 NOTE — Progress Notes (Signed)
Daily Session Note  Patient Details  Name: Willie Burton MRN: [id number] Date of Birth: Aug 11, 1946 Referring Provider:  Teodoro Spray, MD  Encounter Date: 02/20/2016  Check In:     Session Check In - 02/20/16 0821    Check-In   Location [facility]   Staff Present Candiss Norse, MS, ACSM CEP, Exercise Physiologist;Steven Way, BS, ACSM EP-C, Exercise Physiologist;Susanne Bice, RN, BSN, CCRP   Supervising physician immediately available to respond to emergencies See telemetry face sheet for immediately available ER MD   Medication changes reported     Yes   Comments See medication list   Fall or balance concerns reported    No   Warm-up and Cool-down Performed on first and last piece of equipment   Resistance Training Performed Yes   VAD Patient? No   Pain Assessment   Currently in Pain? No/denies   Multiple Pain Sites No         Goals Met:  Independence with exercise equipment Exercise tolerated well No report of cardiac concerns or symptoms Strength training completed today  Goals Unmet:  Not Applicable  Comments: Patient completed exercise prescription and all exercise goals during rehab session. The exercise was tolerated well and the patient is progressing in the program.    Dr. Emily Filbert is Medical Director for [place] and [facility].

## 2016-02-21 ENCOUNTER — Encounter: Payer: Self-pay | Admitting: *Deleted

## 2016-02-21 DIAGNOSIS — Z951 Presence of aortocoronary bypass graft: Secondary | ICD-10-CM

## 2016-02-21 NOTE — Progress Notes (Signed)
Cardiac Individual Treatment Plan  Patient Details  Name: Willie Burton MRN: [id number] Date of Birth: February 04, 1946 Referring Provider:  Teodoro Spray, MD  Initial Encounter Date:       Cardiac Rehab from 12/31/2015 in [facility] Cardiac and Pulmonary Rehab   Date  12/31/15      Visit Diagnosis: S/P CABG x 3  Patient's Home Medications on Admission:  Current outpatient prescriptions:  .  aspirin EC 81 MG tablet, Take by mouth daily. , Disp: , Rfl:  .  atorvastatin (LIPITOR) 40 MG tablet, Take 40 mg by mouth daily., Disp: , Rfl:  .  cyclobenzaprine (FLEXERIL) 10 MG tablet, Take by mouth at bedtime. Reported on 12/31/2015, Disp: , Rfl:  .  gabapentin (NEURONTIN) 300 MG capsule, Take 300 mg by mouth 3 (three) times daily., Disp: , Rfl:  .  ibuprofen (ADVIL,MOTRIN) 600 MG tablet, 600 mg every 4 (four) hours as needed for mild pain. Reported on 12/31/2015, Disp: , Rfl:  .  metoprolol succinate (TOPROL-XL) 50 MG 24 hr tablet, Take 50 mg by mouth daily. Reported on 12/31/2015, Disp: , Rfl:  .  metoprolol tartrate (LOPRESSOR) 25 MG tablet, Take by mouth., Disp: , Rfl:  .  pravastatin (PRAVACHOL) 20 MG tablet, Take by mouth daily. Reported on 12/31/2015, Disp: , Rfl:  .  vitamin B-12 (CYANOCOBALAMIN) 1000 MCG tablet, Take 1,000 mcg by mouth daily. Reported on 12/31/2015, Disp: , Rfl:   Past Medical History: Past Medical History  Diagnosis Date  . High blood pressure   . Prostate cancer ([place])   . High cholesterol   . 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 Arthritis   . Hyperlipemia     Tobacco Use: History  Smoking status  . Former Smoker -- 1.50 packs/day for 5 years  . Types: Cigarettes  Smokeless tobacco  . Never Used    Comment: Quit 40+ years ago    Labs: Recent Review Flowsheet Data    There is no flowsheet data to display.       Exercise Target Goals:    Exercise Program Goal: Individual exercise prescription set with THRR, safety & activity barriers.  Participant demonstrates ability to understand and report RPE using BORG scale, to self-measure pulse accurately, and to acknowledge the importance of the exercise prescription.  Exercise Prescription Goal: Starting with aerobic activity 30 plus minutes a day, 3 days per week for initial exercise prescription. Provide home exercise prescription and guidelines that participant acknowledges understanding prior to discharge.  Activity Barriers & Risk Stratification:     Activity Barriers & Cardiac Risk Stratification - 12/31/15 1336    Activity Barriers & Cardiac Risk Stratification   Activity Barriers Back Problems;Neck/Spine Problems  Lumbar disc degeneration: can flare up with exercise   Cardiac Risk Stratification High      6 Minute Walk:     6 Minute Walk      12/31/15 1413       6 Minute Walk   Phase Initial     Distance 1477 feet     Walk Time 6 minutes     RPE 9     Symptoms No     Resting HR 67 bpm     Resting BP 126/72 mmHg     Max Ex. HR 91 bpm     Max Ex. BP 140/80 mmHg        Initial Exercise Prescription:     Initial Exercise Prescription - 12/31/15 1400  Date of Initial Exercise Prescription   Date 12/31/15   Treadmill   MPH 2.8   Grade 0   Minutes 15   Bike   Level 1.5   Minutes 15   Recumbant Bike   Level 3   RPM 40   Minutes 15   NuStep   Level 4   Watts 50   Minutes 15   Arm Ergometer   Level 2   Watts 10   Minutes 15   Arm/Foot Ergometer   Level 2   Watts 15   Minutes 15   Cybex   Level 3   RPM 40   Minutes 15   Recumbant Elliptical   Level 2   Watts 20   Minutes 15   Elliptical   Level 1   Speed 3   Minutes 5   REL-XR   Level 4   Watts 50   Minutes 15   T5 Nustep   Level 3   Watts 30   Minutes 15   Biostep-RELP   Level 4   Watts 50   Minutes 15   Prescription Details   Frequency (times per week) 3   Duration Progress to 30 minutes of continuous aerobic without signs/symptoms of physical distress    Intensity   THRR REST +  30   Ratings of Perceived Exertion 11-15   Perceived Dyspnea 2-4   Progression   Progression Continue progressive overload as per policy without signs/symptoms or physical distress.   Resistance Training   Training Prescription Yes   Weight 2   Reps 10-15      Perform Capillary Blood Glucose checks as needed.  Exercise Prescription Changes:     Exercise Prescription Changes      12/31/15 1400 01/04/16 0900 01/08/16 0900 01/11/16 0900 01/14/16 1500   Exercise Review   Progression   Yes Yes Yes   Response to Exercise   Blood Pressure (Admit) 124/72 mmHg    120/72 mmHg   Blood Pressure (Exercise) 140/80 mmHg    154/70 mmHg   Blood Pressure (Exit) 142/78 mmHg    132/74 mmHg   Heart Rate (Admit) 64 bpm    78 bpm   Heart Rate (Exercise) 93 bpm    118 bpm   Heart Rate (Exit) 68 bpm    90 bpm   Perceived Dyspnea (Exercise) 9       Symptoms  None None None None   Comments  First day of exercise! Patient was oriented to the gym and the equipment functions and settings. Procedures and policies of the gym were outlined and explained. The patient's individual exercise prescription and treatment plan were reviewed with them. All starting workloads were established based on the results of the functional testing  done at the initial intake visit. The plan for exercise progression was also introduced and progression will be customized based on the patient's performance and goals.  A new target heart rate range for exercise was calculated based on the patient's ability to exercise with increased intensity. The range is 40-85% of HRR and encompasses moderate-vigorous exercise. The target heart rate range is equivalent to an RPE of 12-17. HHR = 110-149 (assuming a resting HR of 75) . Reviewed individualized exercise prescription and made increases per departmental policy. Exercise increases were discussed with the patient and they were able to perform the new work loads without  issue (no signs or symptoms).  Reviewed individualized exercise prescription and made increases per departmental policy. Exercise increases  were discussed with the patient and they were able to perform the new work loads without issue (no signs or symptoms).  Constantinos has been progressing through exercise mode and work level. He has now added the elliptical for 10 minutes in additon to his treadmill walking   Frequency    Add 2 additional days to program exercise sessions. Add 2 additional days to program exercise sessions.  Aldahir continues his home ex. routine   Duration  Progress to 30 minutes of continuous aerobic without signs/symptoms of physical distress Progress to 30 minutes of continuous aerobic without signs/symptoms of physical distress Progress to 50 minutes of aerobic without signs/symptoms of physical distress Progress to 50 minutes of aerobic without signs/symptoms of physical distress   Intensity  Rest + 30 Other (comment)  40-85% HRR 110-149 Other (comment)  40-85% HRR 110-149 Other (comment)  40-85% HRR 110-149   Progression   Progression  Continue progressive overload as per policy without signs/symptoms or physical distress. Continue progressive overload as per policy without signs/symptoms or physical distress. Continue progressive overload as per policy without signs/symptoms or physical distress. Continue progressive overload as per policy without signs/symptoms or physical distress.   Resistance Training   Training Prescription (read-only)  Yes Yes Yes Yes   Weight (read-only)  '3 4 4 4   '$ Reps (read-only)  10-15 10-15 10-15 10-15   Treadmill   MPH (read-only)   3.2 3.6 3.6   Grade (read-only)   '2 2 2   '$ Minutes (read-only)   '20 30 30   '$ Elliptical   Level (read-only)    1 1   Speed (read-only)    3 3   Minutes (read-only)    10 10   Biostep-RELP   Level (read-only)   '4 8 8   '$ Watts (read-only)   50 60 60   Minutes (read-only)   '20 30 30     '$ 01/16/16 0900 01/28/16 0800  02/11/16 1100 02/15/16 0800     Exercise Review   Progression Yes Yes Yes Yes    Response to Exercise   Blood Pressure (Admit)   128/80 mmHg     Blood Pressure (Exercise)   166/86 mmHg     Blood Pressure (Exit)   122/64 mmHg     Heart Rate (Admit)   76 bpm     Heart Rate (Exercise)   107 bpm     Heart Rate (Exit)   81 bpm     Rating of Perceived Exertion (Exercise)   13     Symptoms None None None None    Comments Increased Antionio's resistive weights Leiam reports that he is walking at home on most days when he is not in class. He reports that he has tolerated this home exercise well and has had no signs or symptoms.  Carel is maintaining aggressive workloads and doing well with the exercise. Ashutosh's exercise goals are to steadily increase his time and workloads on the elliptical and continue gaining strength and stamina.  Kimsey is maintaining aggressive workloads and doing well with the exercise. Nocholas's exercise goals are to steadily increase his time and workloads on the elliptical and continue gaining strength and stamina.     Frequency Add 2 additional days to program exercise sessions.  Trumaine continues his home ex. routine Add 2 additional days to program exercise sessions.  Stryker continues his home ex. routine Add 2 additional days to program exercise sessions.  Namon continues his home ex. routine Add 2 additional  days to program exercise sessions.  walking 1.5 miles per day, yoga 2d/wk     Duration Progress to 50 minutes of aerobic without signs/symptoms of physical distress Progress to 50 minutes of aerobic without signs/symptoms of physical distress Progress to 50 minutes of aerobic without signs/symptoms of physical distress Progress to 50 minutes of aerobic without signs/symptoms of physical distress    Intensity Other (comment)  40-85% HRR 110-149 Other (comment)  40-85% HRR 110-149 Other (comment)  40-85% HRR 110-149 Other (comment)  40-85% HRR 110-149    Progression    Progression Continue progressive overload as per policy without signs/symptoms or physical distress. Continue progressive overload as per policy without signs/symptoms or physical distress. Continue progressive overload as per policy without signs/symptoms or physical distress. Continue progressive overload as per policy without signs/symptoms or physical distress.    Resistance Training   Training Prescription   Yes Yes    Weight   7 7    Reps   10-15 10-15    Training Prescription (read-only) Yes Yes      Weight (read-only) 7 7      Reps (read-only) 10-15 10-15      Treadmill   MPH   3.6 3.6    Grade   2 2    Minutes   30 30    MPH (read-only) 3.6 3.6      Grade (read-only) 2 2      Minutes (read-only) 30 30      Elliptical   Level   2 2    Speed   4.5 4.5    Minutes   10 10    Level (read-only) 1 1      Speed (read-only) '3 3 3 3    '$ Minutes (read-only) 10 10      Biostep-RELP   Level   8 8    Watts   90 90    Minutes   25 25    Level (read-only) 8 8      Watts (read-only) 60 60      Minutes (read-only) 30 30         Exercise Comments:     Exercise Comments      02/15/16 0841           Exercise Comments Keian is walking everyday for ~1.5 miles at a time. He has also started doing yoga 2d/wk.           Discharge Exercise Prescription (Final Exercise Prescription Changes):     Exercise Prescription Changes - 02/15/16 0800    Exercise Review   Progression Yes   Response to Exercise   Symptoms None   Comments Tayton is maintaining aggressive workloads and doing well with the exercise. Savyon's exercise goals are to steadily increase his time and workloads on the elliptical and continue gaining strength and stamina.    Frequency Add 2 additional days to program exercise sessions.  walking 1.5 miles per day, yoga 2d/wk    Duration Progress to 50 minutes of aerobic without signs/symptoms of physical distress   Intensity Other (comment)  40-85% HRR 110-149    Progression   Progression Continue progressive overload as per policy without signs/symptoms or physical distress.   Resistance Training   Training Prescription Yes   Weight 7   Reps 10-15   Treadmill   MPH 3.6   Grade 2   Minutes 30   Elliptical   Level 2   Speed 4.5   Minutes 10  Speed (read-only) 3   Biostep-RELP   Level 8   Watts 90   Minutes 25      Nutrition:  Target Goals: Understanding of nutrition guidelines, daily intake of sodium '1500mg'$ , cholesterol '200mg'$ , calories 30% from fat and 7% or less from saturated fats, daily to have 5 or more servings of fruits and vegetables.  Biometrics:     Pre Biometrics - 12/31/15 1417    Pre Biometrics   Height 5' 9.5" (1.765 m)   Weight 215 lb 1.6 oz (97.569 kg)   Waist Circumference 42 inches   Hip Circumference 44 inches   Waist to Hip Ratio 0.95 %   BMI (Calculated) 31.4       Nutrition Therapy Plan and Nutrition Goals:     Nutrition Therapy & Goals - 01/14/16 1404    Nutrition Therapy   Diet Instructed on a heart healthy meal plan based on 1900 calories including DASH diet principles,    Drug/Food Interactions Statins/Certain Fruits   Protein (oz) (read-only) 8 ounces/day   Fiber 20 grams   Whole Grain Foods 3 servings   Saturated Fats 13 max. grams   Fruits and Vegetables 5 servings/day   Personal Nutrition Goals   Personal Goal #1 Try thin sliced cheese to help decrease intake of saturated fat.   Personal Goal #2 Increase whole grain servings with goal of at least 3 servings per day.   Personal Goal #3 Read labels for saturated fat, trans fat and sodium.   Comments Patient has made significant positive changes in his diet since his CABG surgery       Nutrition Discharge: Rate Your Plate Scores:     Nutrition Assessments - 01/17/16 1409    Rate Your Plate Scores   Pre Score 78   Pre Score % 87 %      Nutrition Goals Re-Evaluation:   Psychosocial: Target Goals: Acknowledge presence or  absence of depression, maximize coping skills, provide positive support system. Participant is able to verbalize types and ability to use techniques and skills needed for reducing stress and depression.  Initial Review & Psychosocial Screening:     Initial Psych Review & Screening - 12/31/15 1342    Initial Review   Current issues with Current Sleep Concerns  SInce surgery ,waking up and not going back to sleep.  Is doing better since discharge.   Family Dynamics   Good Support System? Yes   Barriers   Psychosocial barriers to participate in program There are no identifiable barriers or psychosocial needs.   Screening Interventions   Interventions Encouraged to exercise      Quality of Life Scores:     Quality of Life - 12/31/15 1433    Quality of Life Scores   Health/Function Pre 28.36 %   Socioeconomic Pre 28 %   Psych/Spiritual Pre 29.64 %   Family Pre 30 %   GLOBAL Pre 28.85 %      PHQ-9:     Recent Review Flowsheet Data    Depression screen [facility] 2/9 12/31/2015   Decreased Interest 0   Down, Depressed, Hopeless 0   PHQ - 2 Score 0   Altered sleeping 1   Tired, decreased energy 0   Change in appetite 0   Feeling bad or failure about yourself  0   Trouble concentrating 0   Moving slowly or fidgety/restless 0   Suicidal thoughts 0   PHQ-9 Score 1   Difficult doing work/chores Not difficult at all  Psychosocial Evaluation and Intervention:     Psychosocial Evaluation - 01/14/16 0955    Psychosocial Evaluation & Interventions   Interventions Encouraged to exercise with the program and follow exercise prescription   Comments Counselor met with Mr. Merriweather today for initial psychosocial evaluation.  He is a 70 year old retired gentleman who had bypass surgery on 11/20/15.  He has a strong support system with a spouse of 30 years and active involvement in his local church community.  Mr. Chait reports sleeping well and has a good appetite.  He denies a history  of depression or anxiety or any current symptoms.  He is typically in a positive mood and has been working out consistently in [employer] class for quite some time along with his spouse.  He has goals to increase his stamina and strength while in this program & be able to follow up with [place] 3x/week.        Psychosocial Re-Evaluation:   Vocational Rehabilitation: Provide vocational rehab assistance to qualifying candidates.   Vocational Rehab Evaluation & Intervention:     Vocational Rehab - 12/31/15 1337    Initial Vocational Rehab Evaluation & Intervention   Assessment shows need for Vocational Rehabilitation No      Education: Education Goals: Education classes will be provided on a weekly basis, covering required topics. Participant will state understanding/return demonstration of topics presented.  Learning Barriers/Preferences:     Learning Barriers/Preferences - 12/31/15 1337    Learning Barriers/Preferences   Learning Barriers None   Learning Preferences None      Education Topics: General Nutrition Guidelines/Fats and Fiber: -Group instruction provided by verbal, written material, models and posters to present the general guidelines for heart healthy nutrition. Gives an explanation and review of dietary fats and fiber.          Cardiac Rehab from 02/20/2016 in [facility] Cardiac and Pulmonary Rehab   Date  02/04/16   Educator  [facility]   Instruction Review Code  2- meets goals/outcomes      Controlling Sodium/Reading Food Labels: -Group verbal and written material supporting the discussion of sodium use in heart healthy nutrition. Review and explanation with models, verbal and written materials for utilization of the food label.      Cardiac Rehab from 02/20/2016 in [facility] Cardiac and Pulmonary Rehab   Date  01/14/16   Educator  CR   Instruction Review Code  2- meets goals/outcomes      Exercise Physiology & Risk Factors: - Group verbal and written  instruction with models to review the exercise physiology of the cardiovascular system and associated critical values. Details cardiovascular disease risk factors and the goals associated with each risk factor.      Cardiac Rehab from 02/20/2016 in [facility] Cardiac and Pulmonary Rehab   Date  01/28/16   Educator  RM   Instruction Review Code  2- meets goals/outcomes      Aerobic Exercise & Resistance Training: - Gives group verbal and written discussion on the health impact of inactivity. On the components of aerobic and resistive training programs and the benefits of this training and how to safely progress through these programs.      Cardiac Rehab from 02/20/2016 in [facility] Cardiac and Pulmonary Rehab   Date  01/30/16   Educator  SW   Instruction Review Code  2- meets goals/outcomes      Flexibility, Balance, General Exercise Guidelines: - Provides group verbal and written instruction on the benefits of flexibility  and balance training programs. Provides general exercise guidelines with specific guidelines to those with heart or lung disease. Demonstration and skill practice provided.      Cardiac Rehab from 02/20/2016 in [facility] Cardiac and Pulmonary Rehab   Date  02/04/16   Educator  [facility]   Instruction Review Code  2- meets goals/outcomes      Stress Management: - Provides group verbal and written instruction about the health risks of elevated stress, cause of high stress, and healthy ways to reduce stress.      Cardiac Rehab from 02/20/2016 in [facility] Cardiac and Pulmonary Rehab   Date  02/06/16   Educator  [facility]   Instruction Review Code  2- meets goals/outcomes      Depression: - Provides group verbal and written instruction on the correlation between heart/lung disease and depressed mood, treatment options, and the stigmas associated with seeking treatment.   Anatomy & Physiology of the Heart: - Group verbal and written instruction and models provide basic cardiac anatomy and physiology,  with the coronary electrical and arterial systems. Review of: AMI, Angina, Valve disease, Heart Failure, Cardiac Arrhythmia, Pacemakers, and the ICD.   Cardiac Procedures: - Group verbal and written instruction and models to describe the testing methods done to diagnose heart disease. Reviews the outcomes of the test results. Describes the treatment choices: Medical Management, Angioplasty, or Coronary Bypass Surgery.      Cardiac Rehab from 02/20/2016 in [facility] Cardiac and Pulmonary Rehab   Date  01/21/16   Educator  SB   Instruction Review Code  2- meets goals/outcomes      Cardiac Medications: - Group verbal and written instruction to review commonly prescribed medications for heart disease. Reviews the medication, class of the drug, and side effects. Includes the steps to properly store meds and maintain the prescription regimen.      Cardiac Rehab from 02/20/2016 in [facility] Cardiac and Pulmonary Rehab   Date  02/20/16 Marcelina Morel attended 01/09/16]   Educator  SB   Instruction Review Code  2- meets goals/outcomes      Go Sex-Intimacy & Heart Disease, Get SMART - Goal Setting: - Group verbal and written instruction through game format to discuss heart disease and the return to sexual intimacy. Provides group verbal and written material to discuss and apply goal setting through the application of the S.M.A.R.T. Method.      Cardiac Rehab from 02/20/2016 in [facility] Cardiac and Pulmonary Rehab   Date  01/21/16   Educator  SB   Instruction Review Code  2- meets goals/outcomes      Other Matters of the Heart: - Provides group verbal, written materials and models to describe Heart Failure, Angina, Valve Disease, and Diabetes in the realm of heart disease. Includes description of the disease process and treatment options available to the cardiac patient.   Exercise & Equipment Safety: - Individual verbal instruction and demonstration of equipment use and safety with use of the equipment.      Cardiac  Rehab from 02/20/2016 in [facility] Cardiac and Pulmonary Rehab   Date  12/31/15   Educator  SB   Instruction Review Code  2- meets goals/outcomes      Infection Prevention: - Provides verbal and written material to individual with discussion of infection control including proper hand washing and proper equipment cleaning during exercise session.      Cardiac Rehab from 02/20/2016 in [facility] Cardiac and Pulmonary Rehab   Date  12/31/15   Educator  SB  Instruction Review Code  2- meets goals/outcomes      Falls Prevention: - Provides verbal and written material to individual with discussion of falls prevention and safety.      Cardiac Rehab from 02/20/2016 in [facility] Cardiac and Pulmonary Rehab   Date  12/31/15   Educator  SB    Instruction Review Code  2- meets goals/outcomes      Diabetes: - Individual verbal and written instruction to review signs/symptoms of diabetes, desired ranges of glucose level fasting, after meals and with exercise. Advice that pre and post exercise glucose checks will be done for 3 sessions at entry of program.    Knowledge Questionnaire Score:     Knowledge Questionnaire Score - 12/31/15 1434    Knowledge Questionnaire Score   Pre Score 26/28      Personal Goals and Risk Factors at Admission:     Personal Goals and Risk Factors at Admission - 12/31/15 1338    Core Components/Risk Factors/Patient Goals on Admission    Weight Management Yes   Intervention (read-only) Learn and follow the exercise and diet guidelines while in the program. Utilize the nutrition and education classes to help gain knowledge of the diet and exercise expectations in the program   Admit Weight 215 lb (97.523 kg)   Goal Weight: Short Term 200 lb (90.719 kg)   Sedentary No;Yes   Intervention (read-only) While in program, learn and follow the exercise prescription taught. Start at a low level workload and increase workload after able to maintain previous level for 30 minutes.  Increase time before increasing intensity.   Hypertension Yes   Goal Participant will see blood pressure controlled within the values of 140/52m/Hg or within value directed by their physician.   Intervention (read-only) Provide nutrition & aerobic exercise along with prescribed medications to achieve BP 140/90 or less.   Lipids Yes   Goal Cholesterol controlled with medications as prescribed, with individualized exercise RX and with personalized nutrition plan. Value goals: LDL < '70mg'$ , HDL > '40mg'$ . Participant states understanding of desired cholesterol values and following prescriptions.   Intervention (read-only) Provide nutrition & aerobic exercise along with prescribed medications to achieve LDL '70mg'$ , HDL >'40mg'$ .      Personal Goals and Risk Factors Review:      Goals and Risk Factor Review      01/14/16 1505 02/15/16 0838         Core Components/Risk Factors/Patient Goals Review   Personal Goals Review Increase Aerobic Exercise and Physical Activity Weight Management/Obesity;Hypertension;Lipids      Review  Blood pressure readings have always been good during class and WZeligcontinues to take BP meds per MD recommendation. WAfnanhas not has blood work completed recently and does not know his current lipid levels. He did stop lipitor due to the muscle pain it was giving him and he felt more energetic and had no pain. However, per his MD he has to go back on it this week. WEulicehas lost 3 lbs since he started in the program and would still like to lose a few more pounds. He has been making more conscious decisions about what he eats and has been walking everyday in order to lose weight. We will follow up with him in the coming weeks to follow up on further weight loss.       Increase Aerobic Exercise and Physical Activity (read-only)   Goals Progress/Improvement seen  Yes       Comments WKlayhas progressed up to  the full aerobic exercise time offered in class and requires no breaks.  Efforts in progression are now focused on intensity through increases in worload. Alam states that he wants to be challenged during the program and reallly wants to exert himself. His TM and BioStep workloads have been increased and he has recently added the elliptical which is very challenging for him. We will slowly add time to the elliptical as this is the hardest machine for him currently. We will followup with him in the coming sessions to provide increases to the ellipitical .           Personal Goals Discharge (Final Personal Goals and Risk Factors Review):      Goals and Risk Factor Review - 02/15/16 0838    Core Components/Risk Factors/Patient Goals Review   Personal Goals Review Weight Management/Obesity;Hypertension;Lipids   Review Blood pressure readings have always been good during class and Shloime continues to take BP meds per MD recommendation. Cleve has not has blood work completed recently and does not know his current lipid levels. He did stop lipitor due to the muscle pain it was giving him and he felt more energetic and had no pain. However, per his MD he has to go back on it this week. Gaurav has lost 3 lbs since he started in the program and would still like to lose a few more pounds. He has been making more conscious decisions about what he eats and has been walking everyday in order to lose weight. We will follow up with him in the coming weeks to follow up on further weight loss.       ITP Comments:     ITP Comments      12/31/15 1436 01/22/16 0842 02/11/16 1014 02/21/16 1309     ITP Comments Initial ITP  Continue with ITP 30 Day Review.  Continue with ITP. Javone was taken off his statin last week to see if it was contributing to his lack of energy. He stated that he has been feeling much better since stopping this medication. He will go back on this medication this coming weekend to see if his symptoms return.  30 Day Review. Continue with the ITP.       Comments:

## 2016-02-22 ENCOUNTER — Encounter: Payer: Medicare Other | Admitting: *Deleted

## 2016-02-22 DIAGNOSIS — Z951 Presence of aortocoronary bypass graft: Secondary | ICD-10-CM | POA: Diagnosis not present

## 2016-02-22 NOTE — Progress Notes (Signed)
Daily Session Note  Patient Details  Name: Willie Burton MRN: [id number] Date of Birth: 04/18/1946 Referring Provider:  Teodoro Spray, MD  Encounter Date: 02/22/2016  Check In:     Session Check In - 02/22/16 0830    Check-In   Location [facility]   Staff Present Heath Lark, RN, BSN, CCRP;Sukhman Martine, RN, Michaela Corner, RRT, RCP, Respiratory Therapist   Supervising physician immediately available to respond to emergencies See telemetry face sheet for immediately available ER MD   Medication changes reported     No   Fall or balance concerns reported    No   Warm-up and Cool-down Performed on first and last piece of equipment   Resistance Training Performed Yes   VAD Patient? No   Pain Assessment   Currently in Pain? No/denies         Goals Met:  Proper associated with RPD/PD & O2 Sat Exercise tolerated well  Goals Unmet:  Not Applicable  Comments: 6 min walk test done today.   Dr. Emily Filbert is Medical Director for [facility] and [facility].

## 2016-02-25 ENCOUNTER — Encounter: Payer: Medicare Other | Admitting: *Deleted

## 2016-02-25 DIAGNOSIS — Z951 Presence of aortocoronary bypass graft: Secondary | ICD-10-CM

## 2016-02-25 NOTE — Progress Notes (Signed)
Daily Session Note  Patient Details  Name: Willie Burton MRN: [id number] Date of Birth: 11/08/1946 Referring Provider:  Teodoro Spray, MD  Encounter Date: 02/25/2016  Check In:     Session Check In - 02/25/16 0949    Check-In   Location [facility]   Staff Present Heath Lark, RN, BSN, CCRP;Kobie Matkins Dillard Essex, MS, ACSM CEP, Exercise Physiologist;Kelly Amedeo Plenty, BS, ACSM CEP, Exercise Physiologist   Supervising physician immediately available to respond to emergencies See telemetry face sheet for immediately available ER MD   Medication changes reported     No   Fall or balance concerns reported    No   Warm-up and Cool-down Performed on first and last piece of equipment   Resistance Training Performed No   VAD Patient? No   Pain Assessment   Currently in Pain? No/denies   Multiple Pain Sites No         Goals Met:  Independence with exercise equipment Exercise tolerated well No report of cardiac concerns or symptoms  Goals Unmet:  Not Applicable  Comments: Patient completed exercise prescription and all exercise goals during rehab session. The exercise was tolerated well and the patient is progressing in the program.    Dr. Emily Filbert is Medical Director for [facility].

## 2016-02-27 DIAGNOSIS — Z951 Presence of aortocoronary bypass graft: Secondary | ICD-10-CM | POA: Diagnosis not present

## 2016-02-27 NOTE — Progress Notes (Signed)
Daily Session Note  Patient Details  Name: KAVAN DEVAN MRN: [id number] Date of Birth: 06-28-1946 Referring Provider:  Teodoro Spray, MD  Encounter Date: 02/27/2016  Check In:     Session Check In - 02/27/16 0832    Check-In   Location [facility]   Staff Present Heath Lark, RN, BSN, CCRP;Avanna Sowder, BS, ACSM EP-C, Exercise Physiologist;Renee Coldwater, MS, ACSM CEP, Exercise Physiologist   Supervising physician immediately available to respond to emergencies See telemetry face sheet for immediately available ER MD   Medication changes reported     No   Fall or balance concerns reported    No   Warm-up and Cool-down Performed on first and last piece of equipment   Resistance Training Performed No   VAD Patient? No   Pain Assessment   Currently in Pain? No/denies         Goals Met:  Proper associated with RPD/PD & O2 Sat Exercise tolerated well No report of cardiac concerns or symptoms Strength training completed today  Goals Unmet:  Not Applicable  Comments:    Dr. Emily Filbert is Medical Director for [facility].

## 2016-02-29 ENCOUNTER — Encounter: Payer: Medicare Other | Admitting: *Deleted

## 2016-02-29 DIAGNOSIS — Z951 Presence of aortocoronary bypass graft: Secondary | ICD-10-CM

## 2016-02-29 NOTE — Progress Notes (Signed)
Daily Session Note  Patient Details  Name: Willie Burton MRN: [id number] Date of Birth: November 05, 1946 Referring Provider:  Teodoro Spray, MD  Encounter Date: 02/29/2016  Check In:     Session Check In - 02/29/16 0835    Check-In   Location [facility]   Staff Present Gerlene Burdock, RN, Drusilla Kanner, MS, ACSM CEP, Exercise Physiologist;Susanne Bice, RN, BSN, CCRP   Supervising physician immediately available to respond to emergencies See telemetry face sheet for immediately available ER MD   Medication changes reported     No   Fall or balance concerns reported    No   Warm-up and Cool-down Performed on first and last piece of equipment   Resistance Training Performed Yes   VAD Patient? No   Pain Assessment   Currently in Pain? No/denies   Multiple Pain Sites No         Goals Met:  Independence with exercise equipment Exercise tolerated well No report of cardiac concerns or symptoms Strength training completed today  Goals Unmet:  Not Applicable  Comments: Patient completed exercise prescription and all exercise goals during rehab session. The exercise was tolerated well and the patient is progressing in the program.    Dr. Emily Filbert is Medical Director for [facility].

## 2016-02-29 NOTE — Patient Instructions (Signed)
Discharge Instructions  Patient Details  Name: Willie Burton MRN: [id number] Date of Birth: [birth year] Referring Provider:  Teodoro Spray, MD   Number of Visits:   Reason for Discharge:  Patient reached a stable level of exercise.  Smoking History:  History  Smoking status  . Former Smoker -- 1.50 packs/day for 5 years  . Types: Cigarettes  Smokeless tobacco  . Never Used    Comment: Quit 40+ years ago    Diagnosis:  S/P CABG x 3  Initial Exercise Prescription:     Initial Exercise Prescription - 12/31/15 1400    Date of Initial Exercise Prescription   Date 12/31/15   Treadmill   MPH 2.8   Grade 0   Minutes 15   Bike   Level 1.5   Minutes 15   Recumbant Bike   Level 3   RPM 40   Minutes 15   NuStep   Level 4   Watts 50   Minutes 15   Arm Ergometer   Level 2   Watts 10   Minutes 15   Arm/Foot Ergometer   Level 2   Watts 15   Minutes 15   Cybex   Level 3   RPM 40   Minutes 15   Recumbant Elliptical   Level 2   Watts 20   Minutes 15   Elliptical   Level 1   Speed 3   Minutes 5   REL-XR   Level 4   Watts 50   Minutes 15   T5 Nustep   Level 3   Watts 30   Minutes 15   Biostep-RELP   Level 4   Watts 50   Minutes 15   Prescription Details   Frequency (times per week) 3   Duration Progress to 30 minutes of continuous aerobic without signs/symptoms of physical distress   Intensity   THRR REST +  30   Ratings of Perceived Exertion 11-15   Perceived Dyspnea 2-4   Progression   Progression Continue progressive overload as per policy without signs/symptoms or physical distress.   Resistance Training   Training Prescription Yes   Weight 2   Reps 10-15      Discharge Exercise Prescription (Final Exercise Prescription Changes):     Exercise Prescription Changes - 02/15/16 0800    Exercise Review   Progression Yes   Response to Exercise   Symptoms None   Comments Sumit is maintaining aggressive workloads and doing well with the  exercise. Shizuo's exercise goals are to steadily increase his time and workloads on the elliptical and continue gaining strength and stamina.    Duration Progress to 50 minutes of aerobic without signs/symptoms of physical distress   Intensity Other (comment)  40-85% HRR 110-149   Progression   Progression Continue progressive overload as per policy without signs/symptoms or physical distress.   Resistance Training   Training Prescription Yes   Weight 7   Reps 10-15   Treadmill   MPH 3.6   Grade 2   Minutes 30   Elliptical   Level 2   Speed 4.5   Minutes 10   Speed (read-only) 3   Biostep-RELP   Level 8   Watts 90   Minutes 25   Home Exercise Plan   Frequency Add 2 additional days to program exercise sessions.  walking 1.5 miles per day, yoga 2d/wk       Functional Capacity:     6 Minute Walk  12/31/15 1413 02/22/16 0829     6 Minute Walk   Phase Initial Discharge    Distance 1477 feet 1600 feet    Walk Time 6 minutes 6 minutes    RPE 9 8    Symptoms No     Resting HR 67 bpm 60 bpm    Resting BP 126/72 mmHg 120/64 mmHg    Max Ex. HR 91 bpm 96 bpm    Max Ex. BP 140/80 mmHg 144/70 mmHg       Quality of Life:     Quality of Life - 02/25/16 1055    Quality of Life Scores   Health/Function Pre 28.36 %   Health/Function Post 29.14 %   Health/Function % Change 2.75 %   Socioeconomic Pre 28 %   Socioeconomic Post 30 %   Socioeconomic % Change  7.14 %   Psych/Spiritual Pre 29.64 %   Psych/Spiritual Post 30 %   Psych/Spiritual % Change 1.21 %   Family Pre 30 %   Family Post 30 %   Family % Change 0 %   GLOBAL Pre 28.85 %   GLOBAL Post 29.63 %   GLOBAL % Change 2.7 %      Personal Goals: Goals established at orientation with interventions provided to work toward goal.     Personal Goals and Risk Factors at Admission - 12/31/15 1338    Core Components/Risk Factors/Patient Goals on Admission    Weight Management Yes   Intervention (read-only) Learn  and follow the exercise and diet guidelines while in the program. Utilize the nutrition and education classes to help gain knowledge of the diet and exercise expectations in the program   Admit Weight 215 lb (97.523 kg)   Goal Weight: Short Term 200 lb (90.719 kg)   Sedentary No;Yes   Intervention (read-only) While in program, learn and follow the exercise prescription taught. Start at a low level workload and increase workload after able to maintain previous level for 30 minutes. Increase time before increasing intensity.   Hypertension Yes   Goal Participant will see blood pressure controlled within the values of 140/63m/Hg or within value directed by their physician.   Intervention (read-only) Provide nutrition & aerobic exercise along with prescribed medications to achieve BP 140/90 or less.   Lipids Yes   Goal Cholesterol controlled with medications as prescribed, with individualized exercise RX and with personalized nutrition plan. Value goals: LDL < '70mg'$ , HDL > '40mg'$ . Participant states understanding of desired cholesterol values and following prescriptions.   Intervention (read-only) Provide nutrition & aerobic exercise along with prescribed medications to achieve LDL '70mg'$ , HDL >'40mg'$ .       Personal Goals Discharge:     Goals and Risk Factor Review - 02/29/16 1045    Core Components/Risk Factors/Patient Goals Review   Review WLearysaid he is feeling better and he is ready to start traveling with his wife when he has completed his 336/36 sessions of Cardiac Rehab.       Nutrition & Weight - Outcomes:     Pre Biometrics - 12/31/15 1417    Pre Biometrics   Height 5' 9.5" (1.765 m)   Weight 215 lb 1.6 oz (97.569 kg)   Waist Circumference 42 inches   Hip Circumference 44 inches   Waist to Hip Ratio 0.95 %   BMI (Calculated) 31.4       Nutrition:     Nutrition Therapy & Goals - 01/14/16 1404    Nutrition Therapy  Diet Instructed on a heart healthy meal plan based on  1900 calories including DASH diet principles,    Drug/Food Interactions Statins/Certain Fruits   Protein (oz) (read-only) 8 ounces/day   Fiber 20 grams   Whole Grain Foods 3 servings   Saturated Fats 13 max. grams   Fruits and Vegetables 5 servings/day   Personal Nutrition Goals   Personal Goal #1 Try thin sliced cheese to help decrease intake of saturated fat.   Personal Goal #2 Increase whole grain servings with goal of at least 3 servings per day.   Personal Goal #3 Read labels for saturated fat, trans fat and sodium.   Comments Patient has made significant positive changes in his diet since his CABG surgery       Nutrition Discharge:     Nutrition Assessments - 02/25/16 1054    Rate Your Plate Scores   Pre Score 78   Pre Score % 87 %   Post Score 77   Post Score % 86 %   % Change -1 %      Education Questionnaire Score:     Knowledge Questionnaire Score - 02/25/16 1057    Knowledge Questionnaire Score   Pre Score 26/28   Post Score 25/28      Goals reviewed with patient; copy given to patient.

## 2016-02-29 NOTE — Progress Notes (Signed)
Cardiac Individual Treatment Plan  Patient Details  Name: Willie Burton MRN: [id number] Date of Birth: [birth year] Referring Provider:  Teodoro Spray, MD  Initial Encounter Date:       Cardiac Rehab from 12/31/2015 in [facility]   Date  12/31/15      Visit Diagnosis: S/P CABG x 3  Patient's Home Medications on Admission:  Current outpatient prescriptions:  .  aspirin EC 81 MG tablet, Take by mouth daily. , Disp: , Rfl:  .  atorvastatin (LIPITOR) 40 MG tablet, Take 40 mg by mouth daily., Disp: , Rfl:  .  cyclobenzaprine (FLEXERIL) 10 MG tablet, Take by mouth at bedtime. Reported on 12/31/2015, Disp: , Rfl:  .  gabapentin (NEURONTIN) 300 MG capsule, Take 300 mg by mouth 3 (three) times daily., Disp: , Rfl:  .  ibuprofen (ADVIL,MOTRIN) 600 MG tablet, 600 mg every 4 (four) hours as needed for mild pain. Reported on 12/31/2015, Disp: , Rfl:  .  metoprolol succinate (TOPROL-XL) 50 MG 24 hr tablet, Take 50 mg by mouth daily. Reported on 12/31/2015, Disp: , Rfl:  .  metoprolol tartrate (LOPRESSOR) 25 MG tablet, Take by mouth., Disp: , Rfl:  .  pravastatin (PRAVACHOL) 20 MG tablet, Take by mouth daily. Reported on 12/31/2015, Disp: , Rfl:  .  rosuvastatin (CRESTOR) 5 MG tablet, Take by mouth., Disp: , Rfl:  .  vitamin B-12 (CYANOCOBALAMIN) 1000 MCG tablet, Take 1,000 mcg by mouth daily. Reported on 12/31/2015, Disp: , Rfl:   Past Medical History: Past Medical History  Diagnosis Date  . High blood pressure   . Prostate cancer ([place])   . High cholesterol   . 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 Arthritis   . Hyperlipemia     Tobacco Use: History  Smoking status  . Former Smoker -- 1.50 packs/day for 5 years  . Types: Cigarettes  Smokeless tobacco  . Never Used    Comment: Quit 68+ years ago    Labs: Recent Review Flowsheet Data    There is no flowsheet data to display.       Exercise Target Goals:    Exercise Program Goal: Individual  exercise prescription set with THRR, safety & activity barriers. Participant demonstrates ability to understand and report RPE using BORG scale, to self-measure pulse accurately, and to acknowledge the importance of the exercise prescription.  Exercise Prescription Goal: Starting with aerobic activity 30 plus minutes a day, 3 days per week for initial exercise prescription. Provide home exercise prescription and guidelines that participant acknowledges understanding prior to discharge.  Activity Barriers & Risk Stratification:     Activity Barriers & Cardiac Risk Stratification - 12/31/15 1336    Activity Barriers & Cardiac Risk Stratification   Activity Barriers Back Problems;Neck/Spine Problems  Lumbar disc degeneration: can flare up with exercise   Cardiac Risk Stratification High      6 Minute Walk:     6 Minute Walk      12/31/15 1413 02/22/16 0829     6 Minute Walk   Phase Initial Discharge    Distance 1477 feet 1600 feet    Walk Time 6 minutes 6 minutes    RPE 9 8    Symptoms No     Resting HR 67 bpm 60 bpm    Resting BP 126/72 mmHg 120/64 mmHg    Max Ex. HR 91 bpm 96 bpm    Max Ex. BP 140/80 mmHg 144/70 mmHg  Initial Exercise Prescription:     Initial Exercise Prescription - 12/31/15 1400    Date of Initial Exercise Prescription   Date 12/31/15   Treadmill   MPH 2.8   Grade 0   Minutes 15   Bike   Level 1.5   Minutes 15   Recumbant Bike   Level 3   RPM 40   Minutes 15   NuStep   Level 4   Watts 50   Minutes 15   Arm Ergometer   Level 2   Watts 10   Minutes 15   Arm/Foot Ergometer   Level 2   Watts 15   Minutes 15   Cybex   Level 3   RPM 40   Minutes 15   Recumbant Elliptical   Level 2   Watts 20   Minutes 15   Elliptical   Level 1   Speed 3   Minutes 5   REL-XR   Level 4   Watts 50   Minutes 15   T5 Nustep   Level 3   Watts 30   Minutes 15   Biostep-RELP   Level 4   Watts 50   Minutes 15   Prescription Details    Frequency (times per week) 3   Duration Progress to 30 minutes of continuous aerobic without signs/symptoms of physical distress   Intensity   THRR REST +  30   Ratings of Perceived Exertion 11-15   Perceived Dyspnea 2-4   Progression   Progression Continue progressive overload as per policy without signs/symptoms or physical distress.   Resistance Training   Training Prescription Yes   Weight 2   Reps 10-15      Perform Capillary Blood Glucose checks as needed.  Exercise Prescription Changes:     Exercise Prescription Changes      12/31/15 1400 01/04/16 0900 01/08/16 0900 01/11/16 0900 01/14/16 1500   Exercise Review   Progression   Yes Yes Yes   Response to Exercise   Blood Pressure (Admit) 124/72 mmHg    120/72 mmHg   Blood Pressure (Exercise) 140/80 mmHg    154/70 mmHg   Blood Pressure (Exit) 142/78 mmHg    132/74 mmHg   Heart Rate (Admit) 64 bpm    78 bpm   Heart Rate (Exercise) 93 bpm    118 bpm   Heart Rate (Exit) 68 bpm    90 bpm   Perceived Dyspnea (Exercise) 9       Symptoms  None None None None   Comments  First day of exercise! Patient was oriented to the gym and the equipment functions and settings. Procedures and policies of the gym were outlined and explained. The patient's individual exercise prescription and treatment plan were reviewed with them. All starting workloads were established based on the results of the functional testing  done at the initial intake visit. The plan for exercise progression was also introduced and progression will be customized based on the patient's performance and goals.  A new target heart rate range for exercise was calculated based on the patient's ability to exercise with increased intensity. The range is 40-85% of HRR and encompasses moderate-vigorous exercise. The target heart rate range is equivalent to an RPE of 12-17. HHR = 110-149 (assuming a resting HR of 75) . Reviewed individualized exercise prescription and made increases  per departmental policy. Exercise increases were discussed with the patient and they were able to perform the new work loads without issue (no  signs or symptoms).  Reviewed individualized exercise prescription and made increases per departmental policy. Exercise increases were discussed with the patient and they were able to perform the new work loads without issue (no signs or symptoms).  Jesusmanuel has been progressing through exercise mode and work level. He has now added the elliptical for 10 minutes in additon to his treadmill walking   Duration  Progress to 30 minutes of continuous aerobic without signs/symptoms of physical distress Progress to 30 minutes of continuous aerobic without signs/symptoms of physical distress Progress to 50 minutes of aerobic without signs/symptoms of physical distress Progress to 50 minutes of aerobic without signs/symptoms of physical distress   Intensity  Rest + 30 Other (comment)  40-85% HRR 110-149 Other (comment)  40-85% HRR 110-149 Other (comment)  40-85% HRR 110-149   Progression   Progression  Continue progressive overload as per policy without signs/symptoms or physical distress. Continue progressive overload as per policy without signs/symptoms or physical distress. Continue progressive overload as per policy without signs/symptoms or physical distress. Continue progressive overload as per policy without signs/symptoms or physical distress.   Resistance Training   Training Prescription (read-only)  Yes Yes Yes Yes   Weight (read-only)  '3 4 4 4   '$ Reps (read-only)  10-15 10-15 10-15 10-15   Treadmill   MPH (read-only)   3.2 3.6 3.6   Grade (read-only)   '2 2 2   '$ Minutes (read-only)   '20 30 30   '$ Elliptical   Level (read-only)    1 1   Speed (read-only)    3 3   Minutes (read-only)    10 10   Biostep-RELP   Level (read-only)   '4 8 8   '$ Watts (read-only)   50 60 60   Minutes (read-only)   '20 30 30   '$ Home Exercise Plan   Frequency    Add 2 additional days to  program exercise sessions. Add 2 additional days to program exercise sessions.  Khayree continues his home ex. routine     01/16/16 0900 01/28/16 0800 02/11/16 1100 02/15/16 0800     Exercise Review   Progression Yes Yes Yes Yes    Response to Exercise   Blood Pressure (Admit)   128/80 mmHg     Blood Pressure (Exercise)   166/86 mmHg     Blood Pressure (Exit)   122/64 mmHg     Heart Rate (Admit)   76 bpm     Heart Rate (Exercise)   107 bpm     Heart Rate (Exit)   81 bpm     Rating of Perceived Exertion (Exercise)   13     Symptoms None None None None    Comments Increased Drezden's resistive weights Yvette reports that he is walking at home on most days when he is not in class. He reports that he has tolerated this home exercise well and has had no signs or symptoms.  Nyheem is maintaining aggressive workloads and doing well with the exercise. Dallas's exercise goals are to steadily increase his time and workloads on the elliptical and continue gaining strength and stamina.  Carle is maintaining aggressive workloads and doing well with the exercise. Axavier's exercise goals are to steadily increase his time and workloads on the elliptical and continue gaining strength and stamina.     Duration Progress to 50 minutes of aerobic without signs/symptoms of physical distress Progress to 50 minutes of aerobic without signs/symptoms of physical distress Progress to 50 minutes of  aerobic without signs/symptoms of physical distress Progress to 50 minutes of aerobic without signs/symptoms of physical distress    Intensity Other (comment)  40-85% HRR 110-149 Other (comment)  40-85% HRR 110-149 Other (comment)  40-85% HRR 110-149 Other (comment)  40-85% HRR 110-149    Progression   Progression Continue progressive overload as per policy without signs/symptoms or physical distress. Continue progressive overload as per policy without signs/symptoms or physical distress. Continue progressive overload as per policy  without signs/symptoms or physical distress. Continue progressive overload as per policy without signs/symptoms or physical distress.    Resistance Training   Training Prescription   Yes Yes    Weight   7 7    Reps   10-15 10-15    Training Prescription (read-only) Yes Yes      Weight (read-only) 7 7      Reps (read-only) 10-15 10-15      Treadmill   MPH   3.6 3.6    Grade   2 2    Minutes   30 30    MPH (read-only) 3.6 3.6      Grade (read-only) 2 2      Minutes (read-only) 30 30      Elliptical   Level   2 2    Speed   4.5 4.5    Minutes   10 10    Level (read-only) 1 1      Speed (read-only) '3 3 3 3    '$ Minutes (read-only) 10 10      Biostep-RELP   Level   8 8    Watts   90 90    Minutes   25 25    Level (read-only) 8 8      Watts (read-only) 60 60      Minutes (read-only) 30 30      Home Exercise Plan   Frequency Add 2 additional days to program exercise sessions.  Kenzo continues his home ex. routine Add 2 additional days to program exercise sessions.  Socrates continues his home ex. routine Add 2 additional days to program exercise sessions.  Reichen continues his home ex. routine Add 2 additional days to program exercise sessions.  walking 1.5 miles per day, yoga 2d/wk        Exercise Comments:     Exercise Comments      02/15/16 0841 02/22/16 0839 02/25/16 1005       Exercise Comments Niv is walking everyday for ~1.5 miles at a time. He has also started doing yoga 2d/wk.  Torsten is going to go back to exercise in the [facility] proogram 3 days a week and try to walk extra. Rage reports that he continues to improve with his energy and exercise levels since stopping Lipitor. He is still waiting and in contact with his doctor as to what new medication he will be put on.         Discharge Exercise Prescription (Final Exercise Prescription Changes):     Exercise Prescription Changes - 02/15/16 0800    Exercise Review   Progression Yes   Response to  Exercise   Symptoms None   Comments Serigne is maintaining aggressive workloads and doing well with the exercise. Treyvion's exercise goals are to steadily increase his time and workloads on the elliptical and continue gaining strength and stamina.    Duration Progress to 50 minutes of aerobic without signs/symptoms of physical distress   Intensity Other (comment)  40-85% HRR 110-149  Progression   Progression Continue progressive overload as per policy without signs/symptoms or physical distress.   Resistance Training   Training Prescription Yes   Weight 7   Reps 10-15   Treadmill   MPH 3.6   Grade 2   Minutes 30   Elliptical   Level 2   Speed 4.5   Minutes 10   Speed (read-only) 3   Biostep-RELP   Level 8   Watts 90   Minutes 25   Home Exercise Plan   Frequency Add 2 additional days to program exercise sessions.  walking 1.5 miles per day, yoga 2d/wk       Nutrition:  Target Goals: Understanding of nutrition guidelines, daily intake of sodium '1500mg'$ , cholesterol '200mg'$ , calories 30% from fat and 7% or less from saturated fats, daily to have 5 or more servings of fruits and vegetables.  Biometrics:     Pre Biometrics - 12/31/15 1417    Pre Biometrics   Height 5' 9.5" (1.765 m)   Weight 215 lb 1.6 oz (97.569 kg)   Waist Circumference 42 inches   Hip Circumference 44 inches   Waist to Hip Ratio 0.95 %   BMI (Calculated) 31.4       Nutrition Therapy Plan and Nutrition Goals:     Nutrition Therapy & Goals - 01/14/16 1404    Nutrition Therapy   Diet Instructed on a heart healthy meal plan based on 1900 calories including DASH diet principles,    Drug/Food Interactions Statins/Certain Fruits   Protein (oz) (read-only) 8 ounces/day   Fiber 20 grams   Whole Grain Foods 3 servings   Saturated Fats 13 max. grams   Fruits and Vegetables 5 servings/day   Personal Nutrition Goals   Personal Goal #1 Try thin sliced cheese to help decrease intake of saturated fat.    Personal Goal #2 Increase whole grain servings with goal of at least 3 servings per day.   Personal Goal #3 Read labels for saturated fat, trans fat and sodium.   Comments Patient has made significant positive changes in his diet since his CABG surgery       Nutrition Discharge: Rate Your Plate Scores:     Nutrition Assessments - 02/25/16 1054    Rate Your Plate Scores   Pre Score 78   Pre Score % 87 %   Post Score 77   Post Score % 86 %   % Change -1 %      Nutrition Goals Re-Evaluation:     Nutrition Goals Re-Evaluation      02/29/16 1011           Personal Goal #1 Re-Evaluation   Goal Progress Seen (p) Yes       Personal Goal #3 Re-Evaluation   Goal Progress Seen (p) Yes          Psychosocial: Target Goals: Acknowledge presence or absence of depression, maximize coping skills, provide positive support system. Participant is able to verbalize types and ability to use techniques and skills needed for reducing stress and depression.  Initial Review & Psychosocial Screening:     Initial Psych Review & Screening - 12/31/15 1342    Initial Review   Current issues with Current Sleep Concerns  SInce surgery ,waking up and not going back to sleep.  Is doing better since discharge.   Family Dynamics   Good Support System? Yes   Barriers   Psychosocial barriers to participate in program There are no identifiable barriers or psychosocial  needs.   Screening Interventions   Interventions Encouraged to exercise      Quality of Life Scores:     Quality of Life - 02/25/16 1055    Quality of Life Scores   Health/Function Pre 28.36 %   Health/Function Post 29.14 %   Health/Function % Change 2.75 %   Socioeconomic Pre 28 %   Socioeconomic Post 30 %   Socioeconomic % Change  7.14 %   Psych/Spiritual Pre 29.64 %   Psych/Spiritual Post 30 %   Psych/Spiritual % Change 1.21 %   Family Pre 30 %   Family Post 30 %   Family % Change 0 %   GLOBAL Pre 28.85 %   GLOBAL  Post 29.63 %   GLOBAL % Change 2.7 %      PHQ-9:     Recent Review Flowsheet Data    Depression screen [facility] 2/9 02/25/2016 12/31/2015   Decreased Interest 0 0   Down, Depressed, Hopeless 0 0   PHQ - 2 Score 0 0   Altered sleeping 1 1   Tired, decreased energy 0 0   Change in appetite 0 0   Feeling bad or failure about yourself  0 0   Trouble concentrating 0 0   Moving slowly or fidgety/restless 0 0   Suicidal thoughts 0 0   PHQ-9 Score 1 1   Difficult doing work/chores Not difficult at all Not difficult at all      Psychosocial Evaluation and Intervention:     Psychosocial Evaluation - 01/14/16 0955    Psychosocial Evaluation & Interventions   Interventions Encouraged to exercise with the program and follow exercise prescription   Comments Counselor met with Mr. Dock today for initial psychosocial evaluation.  He is a 70 year old retired gentleman who had bypass surgery on 11/20/15.  He has a strong support system with a spouse of 12 years and active involvement in his local church community.  Mr. Vanbergen reports sleeping well and has a good appetite.  He denies a history of depression or anxiety or any current symptoms.  He is typically in a positive mood and has been working out consistently in [employer] class for quite some time along with his spouse.  He has goals to increase his stamina and strength while in this program & be able to follow up with [place] 3x/week.        Psychosocial Re-Evaluation:   Vocational Rehabilitation: Provide vocational rehab assistance to qualifying candidates.   Vocational Rehab Evaluation & Intervention:     Vocational Rehab - 12/31/15 1337    Initial Vocational Rehab Evaluation & Intervention   Assessment shows need for Vocational Rehabilitation No      Education: Education Goals: Education classes will be provided on a weekly basis, covering required topics. Participant will state understanding/return demonstration of  topics presented.  Learning Barriers/Preferences:     Learning Barriers/Preferences - 12/31/15 1337    Learning Barriers/Preferences   Learning Barriers None   Learning Preferences None      Education Topics: General Nutrition Guidelines/Fats and Fiber: -Group instruction provided by verbal, written material, models and posters to present the general guidelines for heart healthy nutrition. Gives an explanation and review of dietary fats and fiber.          Cardiac Rehab from 02/22/2016 in [facility] Cardiac and Pulmonary Rehab   Date  02/04/16   Educator  [facility]   Instruction Review Code  2- meets goals/outcomes  Controlling Sodium/Reading Food Labels: -Group verbal and written material supporting the discussion of sodium use in heart healthy nutrition. Review and explanation with models, verbal and written materials for utilization of the food label.      Cardiac Rehab from 02/22/2016 in [facility] Cardiac and Pulmonary Rehab   Date  01/14/16   Educator  CR   Instruction Review Code  2- meets goals/outcomes      Exercise Physiology & Risk Factors: - Group verbal and written instruction with models to review the exercise physiology of the cardiovascular system and associated critical values. Details cardiovascular disease risk factors and the goals associated with each risk factor.      Cardiac Rehab from 02/22/2016 in [facility] Cardiac and Pulmonary Rehab   Date  01/28/16   Educator  RM   Instruction Review Code  2- meets goals/outcomes      Aerobic Exercise & Resistance Training: - Gives group verbal and written discussion on the health impact of inactivity. On the components of aerobic and resistive training programs and the benefits of this training and how to safely progress through these programs.      Cardiac Rehab from 02/22/2016 in [facility] Cardiac and Pulmonary Rehab   Date  01/30/16   Educator  SW   Instruction Review Code  2- meets goals/outcomes      Flexibility, Balance,  General Exercise Guidelines: - Provides group verbal and written instruction on the benefits of flexibility and balance training programs. Provides general exercise guidelines with specific guidelines to those with heart or lung disease. Demonstration and skill practice provided.      Cardiac Rehab from 02/22/2016 in [facility] Cardiac and Pulmonary Rehab   Date  02/04/16   Educator  [facility]   Instruction Review Code  2- meets goals/outcomes      Stress Management: - Provides group verbal and written instruction about the health risks of elevated stress, cause of high stress, and healthy ways to reduce stress.      Cardiac Rehab from 02/22/2016 in [facility] Cardiac and Pulmonary Rehab   Date  02/06/16   Educator  [facility]   Instruction Review Code  2- meets goals/outcomes      Depression: - Provides group verbal and written instruction on the correlation between heart/lung disease and depressed mood, treatment options, and the stigmas associated with seeking treatment.   Anatomy & Physiology of the Heart: - Group verbal and written instruction and models provide basic cardiac anatomy and physiology, with the coronary electrical and arterial systems. Review of: AMI, Angina, Valve disease, Heart Failure, Cardiac Arrhythmia, Pacemakers, and the ICD.   Cardiac Procedures: - Group verbal and written instruction and models to describe the testing methods done to diagnose heart disease. Reviews the outcomes of the test results. Describes the treatment choices: Medical Management, Angioplasty, or Coronary Bypass Surgery.      Cardiac Rehab from 02/22/2016 in [facility] Cardiac and Pulmonary Rehab   Date  01/21/16   Educator  SB   Instruction Review Code  2- meets goals/outcomes      Cardiac Medications: - Group verbal and written instruction to review commonly prescribed medications for heart disease. Reviews the medication, class of the drug, and side effects. Includes the steps to properly store meds and maintain the  prescription regimen.      Cardiac Rehab from 02/22/2016 in [facility] Cardiac and Pulmonary Rehab   Date  02/20/16 Marcelina Morel attended 01/09/16]   Educator  SB   Instruction Review Code  2- meets goals/outcomes  Go Sex-Intimacy & Heart Disease, Get SMART - Goal Setting: - Group verbal and written instruction through game format to discuss heart disease and the return to sexual intimacy. Provides group verbal and written material to discuss and apply goal setting through the application of the S.M.A.R.T. Method.      Cardiac Rehab from 02/22/2016 in [facility] Cardiac and Pulmonary Rehab   Date  01/21/16   Educator  SB   Instruction Review Code  2- meets goals/outcomes      Other Matters of the Heart: - Provides group verbal, written materials and models to describe Heart Failure, Angina, Valve Disease, and Diabetes in the realm of heart disease. Includes description of the disease process and treatment options available to the cardiac patient.   Exercise & Equipment Safety: - Individual verbal instruction and demonstration of equipment use and safety with use of the equipment.      Cardiac Rehab from 02/22/2016 in [facility] Cardiac and Pulmonary Rehab   Date  02/22/16   Educator  C. Ilhan Debenedetto [6 min walk post]   Instruction Review Code  2- meets goals/outcomes      Infection Prevention: - Provides verbal and written material to individual with discussion of infection control including proper hand washing and proper equipment cleaning during exercise session.      Cardiac Rehab from 02/22/2016 in [facility] Cardiac and Pulmonary Rehab   Date  12/31/15   Educator  SB   Instruction Review Code  2- meets goals/outcomes      Falls Prevention: - Provides verbal and written material to individual with discussion of falls prevention and safety.      Cardiac Rehab from 02/22/2016 in [facility] Cardiac and Pulmonary Rehab   Date  12/31/15   Educator  SB    Instruction Review Code  2- meets goals/outcomes       Diabetes: - Individual verbal and written instruction to review signs/symptoms of diabetes, desired ranges of glucose level fasting, after meals and with exercise. Advice that pre and post exercise glucose checks will be done for 3 sessions at entry of program.    Knowledge Questionnaire Score:     Knowledge Questionnaire Score - 02/25/16 1057    Knowledge Questionnaire Score   Pre Score 26/28   Post Score 25/28      Core Components/Risk Factors/Patient Goals at Admission:     Personal Goals and Risk Factors at Admission - 12/31/15 1338    Core Components/Risk Factors/Patient Goals on Admission    Weight Management Yes   Intervention (read-only) Learn and follow the exercise and diet guidelines while in the program. Utilize the nutrition and education classes to help gain knowledge of the diet and exercise expectations in the program   Admit Weight 215 lb (97.523 kg)   Goal Weight: Short Term 200 lb (90.719 kg)   Sedentary No;Yes   Intervention (read-only) While in program, learn and follow the exercise prescription taught. Start at a low level workload and increase workload after able to maintain previous level for 30 minutes. Increase time before increasing intensity.   Hypertension Yes   Goal Participant will see blood pressure controlled within the values of 140/75m/Hg or within value directed by their physician.   Intervention (read-only) Provide nutrition & aerobic exercise along with prescribed medications to achieve BP 140/90 or less.   Lipids Yes   Goal Cholesterol controlled with medications as prescribed, with individualized exercise RX and with personalized nutrition plan. Value goals: LDL < '70mg'$ , HDL > '40mg'$ . Participant  states understanding of desired cholesterol values and following prescriptions.   Intervention (read-only) Provide nutrition & aerobic exercise along with prescribed medications to achieve LDL '70mg'$ , HDL >'40mg'$ .      Core Components/Risk  Factors/Patient Goals Review:      Goals and Risk Factor Review      01/14/16 1505 02/15/16 0838 02/29/16 1045       Core Components/Risk Factors/Patient Goals Review   Personal Goals Review Increase Aerobic Exercise and Physical Activity Weight Management/Obesity;Hypertension;Lipids      Review  Blood pressure readings have always been good during class and Heraclio continues to take BP meds per MD recommendation. Shadd has not has blood work completed recently and does not know his current lipid levels. He did stop lipitor due to the muscle pain it was giving him and he felt more energetic and had no pain. However, per his MD he has to go back on it this week. Caidin has lost 3 lbs since he started in the program and would still like to lose a few more pounds. He has been making more conscious decisions about what he eats and has been walking everyday in order to lose weight. We will follow up with him in the coming weeks to follow up on further weight loss.  Davian said he is feeling better and he is ready to start traveling with his wife when he has completed his 46 /36 sessions of Cardiac Rehab.      Increase Aerobic Exercise and Physical Activity (read-only)   Goals Progress/Improvement seen  Yes       Comments Sou has progressed up to the full aerobic exercise time offered in class and requires no breaks. Efforts in progression are now focused on intensity through increases in worload. Yacoub states that he wants to be challenged during the program and reallly wants to exert himself. His TM and BioStep workloads have been increased and he has recently added the elliptical which is very challenging for him. We will slowly add time to the elliptical as this is the hardest machine for him currently. We will followup with him in the coming sessions to provide increases to the ellipitical .           Core Components/Risk Factors/Patient Goals at Discharge (Final Review):      Goals and Risk Factor  Review - 02/29/16 1045    Core Components/Risk Factors/Patient Goals Review   Review Townes said he is feeling better and he is ready to start traveling with his wife when he has completed his 57 /36 sessions of Cardiac Rehab.       ITP Comments:     ITP Comments      12/31/15 1436 01/22/16 0842 02/11/16 1014 02/21/16 1309 02/22/16 0852   ITP Comments Initial ITP  Continue with ITP 30 Day Review.  Continue with ITP. Wilian was taken off his statin last week to see if it was contributing to his lack of energy. He stated that he has been feeling much better since stopping this medication. He will go back on this medication this coming weekend to see if his symptoms return.  30 Day Review. Continue with the ITP. Zane is going to go back to exercise in the [facility] proogram 3 days a week and try to walk extra.     02/29/16 1010           ITP Comments Gentry said he feels so much better and he wants to start traveling  alot with his wife when he completes his Cardiac Rehab.           Comments:

## 2016-03-07 ENCOUNTER — Encounter: Payer: Medicare Other | Attending: Cardiology

## 2016-03-07 DIAGNOSIS — Z951 Presence of aortocoronary bypass graft: Secondary | ICD-10-CM | POA: Insufficient documentation

## 2016-03-10 ENCOUNTER — Encounter: Payer: Medicare Other | Admitting: *Deleted

## 2016-03-10 DIAGNOSIS — Z951 Presence of aortocoronary bypass graft: Secondary | ICD-10-CM | POA: Diagnosis present

## 2016-03-10 NOTE — Progress Notes (Signed)
Daily Session Note  Patient Details  Name: Willie Burton MRN: [id number] Date of Birth: 05-Oct-1946 Referring Provider:  Idelle Crouch, MD  Encounter Date: 03/10/2016  Check In:     Session Check In - 03/10/16 0854    Check-In   Location [facility]   Staff Present Nyoka Cowden, RN;Morey Andonian, RN, Moises Blood, BS, ACSM CEP, Exercise Physiologist   Supervising physician immediately available to respond to emergencies See telemetry face sheet for immediately available ER MD   Medication changes reported     No   Fall or balance concerns reported    No   Warm-up and Cool-down Performed on first and last piece of equipment   Resistance Training Performed Yes   VAD Patient? No   Pain Assessment   Currently in Pain? No/denies         Goals Met:  Proper associated with RPD/PD & O2 Sat Exercise tolerated well  Goals Unmet:  Not Applicable  Comments:    Dr. Emily Filbert is Medical Director for [facility] and [facility].

## 2016-03-12 ENCOUNTER — Encounter: Payer: Medicare Other | Admitting: *Deleted

## 2016-03-12 DIAGNOSIS — Z951 Presence of aortocoronary bypass graft: Secondary | ICD-10-CM

## 2016-03-12 NOTE — Progress Notes (Signed)
Daily Session Note  Patient Details  Name: Willie Burton MRN: [id number] Date of Birth: [birth year] Referring Provider:  Teodoro Spray, MD  Encounter Date: 03/12/2016  Check In:     Session Check In - 03/12/16 0843    Check-In   Location [facility]   Staff Present Nyoka Cowden, RN;Ophie Burrowes, RN, Alex Gardener, DPT, CEEA   Supervising physician immediately available to respond to emergencies See telemetry face sheet for immediately available ER MD   Medication changes reported     No   Fall or balance concerns reported    No   Warm-up and Cool-down Performed on first and last piece of equipment   Resistance Training Performed No   VAD Patient? No         Goals Met:  Proper associated with RPD/PD & O2 Sat Exercise tolerated well  Goals Unmet:  Not Applicable  Comments:    Dr. Emily Filbert is Medical Director for [place] [facility].

## 2016-03-14 ENCOUNTER — Encounter: Payer: Medicare Other | Admitting: *Deleted

## 2016-03-14 DIAGNOSIS — Z951 Presence of aortocoronary bypass graft: Secondary | ICD-10-CM | POA: Diagnosis not present

## 2016-03-14 NOTE — Progress Notes (Signed)
Daily Session Note  Patient Details  Name: Willie Burton MRN: [id number] Date of Birth: 1946/01/31 Referring Provider:    Encounter Date: 03/14/2016  Check In:     Session Check In - 03/14/16 0907    Check-In   Staff Present Heath Lark, RN, BSN, CCRP;Carroll Enterkin, RN, Alex Gardener, DPT, CEEA   Supervising physician immediately available to respond to emergencies See telemetry face sheet for immediately available ER MD   Medication changes reported     Yes   Comments see itp comments   Fall or balance concerns reported    No   Warm-up and Cool-down Performed on first and last piece of equipment   VAD Patient? No   Pain Assessment   Currently in Pain? No/denies         Goals Met:  Independence with exercise equipment Exercise tolerated well No report of cardiac concerns or symptoms Strength training completed today  Goals Unmet:  Not Applicable  Comments: Doing well with exercise prescription progression.    Dr. Emily Filbert is Medical Director for [place] and [facility].

## 2016-03-17 ENCOUNTER — Encounter: Payer: Medicare Other | Admitting: *Deleted

## 2016-03-17 DIAGNOSIS — Z951 Presence of aortocoronary bypass graft: Secondary | ICD-10-CM | POA: Diagnosis not present

## 2016-03-17 NOTE — Progress Notes (Signed)
Cardiac Individual Treatment Plan  Patient Details  Name: Willie Burton MRN: [id number] Date of Birth: 1946-10-02 Referring Provider:    Initial Encounter Date:       Cardiac Rehab from 12/31/2015 in [facility]iac [facility] Pulmonary Re[facility]   Date  12/31/15      Visit Diagnosis: S/P CABG x 3  Patient's Home Medications on Admission:  Current outpatient prescriptions:  .  aspirin EC 81 MG tablet, Take by mouth daily. , Disp: , Rfl:  .  atorvastatin (LIPITOR) 40 MG tablet, Take 40 mg by mouth daily., Disp: , Rfl:  .  cyclobenzaprine (FLEXERIL) 10 MG tablet, Take by mouth at bedtime. Reported on 12/31/2015, Disp: , Rfl:  .  gabapentin (NEURONTIN) 300 MG capsule, Take 300 mg by mouth 3 (three) times daily., Disp: , Rfl:  .  ibuprofen (ADVIL,MOTRIN) 600 MG tablet, 600 mg every 4 (four) hours as needed for mild pain. Reported on 12/31/2015, Disp: , Rfl:  .  metoprolol succinate (TOPROL-XL) 50 MG 24 hr tablet, Take 50 mg by mouth daily. Reported on 12/31/2015, Disp: , Rfl:  .  metoprolol tartrate (LOPRESSOR) 25 MG tablet, Take by mouth., Disp: , Rfl:  .  pravastatin (PRAVACHOL) 20 MG tablet, Take by mouth daily. Reported on 12/31/2015, Disp: , Rfl:  .  rosuvastatin (CRESTOR) 5 MG tablet, Take by mouth., Disp: , Rfl:  .  vitamin B-12 (CYANOCOBALAMIN) 1000 MCG tablet, Take 1,000 mcg by mouth daily. Reported on 12/31/2015, Disp: , Rfl:   Past Medical History: Past Medical History  Diagnosis Date  . High blood pressure   . Prostate cancer ([place])   . High cholesterol   . 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 Arthritis   . Hyperlipemia     Tobacco Use: History  Smoking status  . Former Smoker -- 1.50 packs/day for 5 years  . Types: Cigarettes  Smokeless tobacco  . Never Used    Comment: Quit 40+ years ago    Labs: Recent Review Flowsheet Data    There is no flowsheet data to display.       Exercise Target Goals:    Exercise Program Goal: Individual exercise prescription  set with THRR, safety & activity barriers. Participant demonstrates ability to understand and report RPE using BORG scale, to self-measure pulse accurately, and to acknowledge the importance of the exercise prescription.  Exercise Prescription Goal: Starting with aerobic activity 30 plus minutes a day, 3 days per week for initial exercise prescription. Provide home exercise prescription and guidelines that participant acknowledges understanding prior to discharge.  Activity Barriers & Risk Stratification:     Activity Barriers & Cardiac Risk Stratification - 12/31/15 1336    Activity Barriers & Cardiac Risk Stratification   Activity Barriers Back Problems;Neck/Spine Problems  Lumbar disc degeneration: can flare up with exercise   Cardiac Risk Stratification High      6 Minute Walk:     6 Minute Walk      12/31/15 1413 02/22/16 0829     6 Minute Walk   Phase Initial Discharge    Distance 1477 feet 1600 feet    Walk Time 6 minutes 6 minutes    RPE 9 8    Symptoms No     Resting HR 67 bpm 60 bpm    Resting BP 126/72 mmHg 120/64 mmHg    Max Ex. HR 91 bpm 96 bpm    Max Ex. BP 140/80 mmHg 144/70 mmHg  Initial Exercise Prescription:     Initial Exercise Prescription - 12/31/15 1400    Date of Initial Exercise RX and Referring Provider   Date 12/31/15   Treadmill   MPH 2.8   Grade 0   Minutes 15   Bike   Level 1.5   Minutes 15   Recumbant Bike   Level 3   RPM 40   Minutes 15   NuStep   Level 4   Watts 50   Minutes 15   Arm Ergometer   Level 2   Watts 10   Minutes 15   Arm/Foot Ergometer   Level 2   Watts 15   Minutes 15   Cybex   Level 3   RPM 40   Minutes 15   Recumbant Elliptical   Level 2   Watts 20   Minutes 15   Elliptical   Level 1   Speed 3   Minutes 5   REL-XR   Level 4   Watts 50   Minutes 15   T5 Nustep   Level 3   Watts 30   Minutes 15   Biostep-RELP   Level 4   Watts 50   Minutes 15   Prescription Details   Frequency  (times per week) 3   Duration Progress to 30 minutes of continuous aerobic without signs/symptoms of physical distress   Intensity   THRR REST +  30   Ratings of Perceived Exertion 11-15   Perceived Dyspnea 2-4   Progression   Progression Continue progressive overload as per policy without signs/symptoms or physical distress.   Resistance Training   Training Prescription Yes   Weight 2   Reps 10-15      Perform Capillary Blood Glucose checks as needed.  Exercise Prescription Changes:     Exercise Prescription Changes      12/31/15 1400 01/04/16 0900 01/08/16 0900 01/11/16 0900 01/14/16 1500   Exercise Review   Progression   Yes Yes Yes   Response to Exercise   Blood Pressure (Admit) 124/72 mmHg    120/72 mmHg   Blood Pressure (Exercise) 140/80 mmHg    154/70 mmHg   Blood Pressure (Exit) 142/78 mmHg    132/74 mmHg   Heart Rate (Admit) 64 bpm    78 bpm   Heart Rate (Exercise) 93 bpm    118 bpm   Heart Rate (Exit) 68 bpm    90 bpm   Perceived Dyspnea (Exercise) 9       Symptoms  None None None None   Comments  First day of exercise! Patient was oriented to the gym and the equipment functions and settings. Procedures and policies of the gym were outlined and explained. The patient's individual exercise prescription and treatment plan were reviewed with them. All starting workloads were established based on the results of the functional testing  done at the initial intake visit. The plan for exercise progression was also introduced and progression will be customized based on the patient's performance and goals.  A new target heart rate range for exercise was calculated based on the patient's ability to exercise with increased intensity. The range is 40-85% of HRR and encompasses moderate-vigorous exercise. The target heart rate range is equivalent to an RPE of 12-17. HHR = 110-149 (assuming a resting HR of 75) . Reviewed individualized exercise prescription and made increases per  departmental policy. Exercise increases were discussed with the patient and they were able to perform the new work loads  without issue (no signs or symptoms).  Reviewed individualized exercise prescription and made increases per departmental policy. Exercise increases were discussed with the patient and they were able to perform the new work loads without issue (no signs or symptoms).  Willie Burton has been progressing through exercise mode and work level. He has now added the elliptical for 10 minutes in additon to his treadmill walking   Duration  Progress to 30 minutes of continuous aerobic without signs/symptoms of physical distress Progress to 30 minutes of continuous aerobic without signs/symptoms of physical distress Progress to 50 minutes of aerobic without signs/symptoms of physical distress Progress to 50 minutes of aerobic without signs/symptoms of physical distress   Intensity  Rest + 30 Other (comment)  40-85% HRR 110-149 Other (comment)  40-85% HRR 110-149 Other (comment)  40-85% HRR 110-149   Progression   Progression  Continue progressive overload as per policy without signs/symptoms or physical distress. Continue progressive overload as per policy without signs/symptoms or physical distress. Continue progressive overload as per policy without signs/symptoms or physical distress. Continue progressive overload as per policy without signs/symptoms or physical distress.   Resistance Training   Training Prescription (read-only)  Yes Yes Yes Yes   Weight (read-only)  _0 Reps (read-only)  10-15 10-15 10-15 10-15   Treadmill   MPH (read-only)   3.2 3.6 3.6   Grade (read-only)   _1 Minutes (read-only)   _2 Elliptical   Level (read-only)    1 1   Speed (read-only)    3 3   Minutes (read-only)    10 10   Biostep-RELP   Level (read-only)   _3 Watts (read-only)   50 60 60   Minutes (read-only)   _4 Home Exercise Plan   Frequency    Add 2 additional days to  program exercise sessions. Add 2 additional days to program exercise sessions.  Willie Burton continues his home ex. routine     01/16/16 0900 01/28/16 0800 02/11/16 1100 02/15/16 0800 03/10/16 0800   Exercise Review   Progression _5    Response to Exercise   Blood Pressure (Admit)   128/80 mmHg  132/72 mmHg   Blood Pressure (Exercise)   166/86 mmHg  152/76 mmHg   Blood Pressure (Exit)   122/64 mmHg  110/62 mmHg   Heart Rate (Admit)   76 bpm  62 bpm   Heart Rate (Exercise)   107 bpm  112 bpm   Heart Rate (Exit)   81 bpm  72 bpm   Rating of Perceived Exertion (Exercise)   13  13   Symptoms _6    Comments Increased Willie Burton's resistive weights Willie Burton reports that he is walking at home on most days when he is not in class. He reports that he has tolerated this home exercise well and has had no signs or symptoms.  Willie Burton is maintaining aggressive workloads and doing well with the exercise. Willie Burton's exercise goals are to steadily increase his time and workloads on the elliptical and continue gaining strength and stamina.  Willie Burton is maintaining aggressive workloads and doing well with the exercise. Willie Burton's exercise goals are to steadily increase his time and workloads on the elliptical and continue gaining strength and stamina.     Duration Progress to 50 minutes of aerobic without signs/symptoms of physical distress Progress to 50 minutes of aerobic without  signs/symptoms of physical distress Progress to 50 minutes of aerobic without signs/symptoms of physical distress Progress to 50 minutes of aerobic without signs/symptoms of physical distress Progress to 50 minutes of aerobic without signs/symptoms of physical distress   Intensity Other (comment)  40-85% HRR 110-149 Other (comment)  40-85% HRR 110-149 Other (comment)  40-85% HRR 110-149 Other (comment)  40-85% HRR 110-149 Other (comment)  110-149 bpm   Progression   Progression Continue progressive overload as per policy  without signs/symptoms or physical distress. Continue progressive overload as per policy without signs/symptoms or physical distress. Continue progressive overload as per policy without signs/symptoms or physical distress. Continue progressive overload as per policy without signs/symptoms or physical distress. Continue progressive overload as per policy without signs/symptoms or physical distress.   Resistance Training   Training Prescription   Yes Yes Yes   Weight   _0 Reps   10-15 10-15 10-15   Training Prescription (read-only) Yes Yes      Weight (read-only) 7 7      Reps (read-only) 10-15 10-15      Interval Training   Interval Training     Yes   Equipment     Biostep-RELP   Comments     Intervals recommended for patient on BioStep ranging from L5-8 with a max RPE of 15 during interval. (2 minX30 second interval)   Treadmill   MPH   3.6 3.6 3.6   Grade   _1 Minutes   _2 20-30 min pending duration on other machines   MPH (read-only) 3.6 3.6      Grade (read-only) 2 2      Minutes (read-only) 30 30      Elliptical   Level   _3 Speed   4.5 4.5 4.6   Minutes   _4 Level (read-only) 1 1      Speed (read-only) _5 Minutes (read-only) 10 10      Biostep-RELP   Level   _6 Intervals from level 5-8 (max 15 RPE)   Watts   90 90 90   Minutes   _7 Level (read-only) 8 8      Watts (read-only) 60 60      Minutes (read-only) 30 30      Home Exercise Plan   Frequency Add 2 additional days to program exercise sessions.  Willie Burton continues his home ex. routine Add 2 additional days to program exercise sessions.  Willie Burton continues his home ex. routine Add 2 additional days to program exercise sessions.  Willie Burton continues his home ex. routine Add 2 additional days to program exercise sessions.  walking 1.5 miles per day, yoga 2d/wk        Exercise Comments:     Exercise Comments      02/15/16 0841 02/22/16 0839 02/25/16 1005 03/10/16 1259      Exercise Comments Willie Burton is walking everyday for ~1.5 miles at a time. He has also started doing yoga 2d/wk.  Willie Burton is going to go back to exercise in the [facility] proogram 3 days a week and try to walk extra. Willie Burton reports that he continues to improve with his energy and exercise levels since stopping Lipitor. He is still waiting and in contact with his doctor as to what new medication he will be put on.  Willie Burton is now  taking crestor to replace the Lipitor and stated that he has a little aching in his legs but still feels much better than before. He stated that he felt really good today and appeared to have a lot of enery. He was able to travel last week to [place] and feels very relaxed from his vacation. He stated he is now starting to feel better during exercise and that the exercise is benefitting his energy levels. He has been able to do some yard work this week with no symptoms.        Discharge Exercise Prescription (Final Exercise Prescription Changes):     Exercise Prescription Changes - 03/10/16 0800    Exercise Review   Progression Yes   Response to Exercise   Blood Pressure (Admit) 132/72 mmHg   Blood Pressure (Exercise) 152/76 mmHg   Blood Pressure (Exit) 110/62 mmHg   Heart Rate (Admit) 62 bpm   Heart Rate (Exercise) 112 bpm   Heart Rate (Exit) 72 bpm   Rating of Perceived Exertion (Exercise) 13   Symptoms none   Duration Progress to 50 minutes of aerobic without signs/symptoms of physical distress   Intensity Other (comment)  110-149 bpm   Progression   Progression Continue progressive overload as per policy without signs/symptoms or physical distress.   Resistance Training   Training Prescription Yes   Weight 7   Reps 10-15   Interval Training   Interval Training Yes   Equipment Biostep-RELP   Comments Intervals recommended for patient on BioStep ranging from L5-8 with a max RPE of 15 during interval. (2 minX30 second interval)   Treadmill   MPH 3.6   Grade 2    Minutes 30  20-30 min pending duration on other machines   Elliptical   Level 6   Speed 4.6   Minutes 15   Biostep-RELP   Level 8  Intervals from level 5-8 (max 15 RPE)   Watts 90   Minutes 20      Nutrition:  Target Goals: Understanding of nutrition guidelines, daily intake of sodium <1575m, cholesterol <2010m calories 30% from fat and 7% or less from saturated fats, daily to have 5 or more servings of fruits and vegetables.  Biometrics:     Pre Biometrics - 12/31/15 1417    Pre Biometrics   Height 5' 9.5" (1.765 m)   Weight 215 lb 1.6 oz (97.569 kg)   Waist Circumference 42 inches   Hip Circumference 44 inches   Waist to Hip Ratio 0.95 %   BMI (Calculated) 31.4       Nutrition Therapy Plan and Nutrition Goals:     Nutrition Therapy & Goals - 01/14/16 1404    Nutrition Therapy   Diet Instructed on a heart healthy meal plan based on 1900 calories including DASH diet principles,    Drug/Food Interactions Statins/Certain Fruits   Protein (oz) (read-only) 8 ounces/day   Fiber 20 grams   Whole Grain Foods 3 servings   Saturated Fats 13 max. grams   Fruits and Vegetables 5 servings/day   Personal Nutrition Goals   Personal Goal #1 Try thin sliced cheese to help decrease intake of saturated fat.   Personal Goal #2 Increase whole grain servings with goal of at least 3 servings per day.   Personal Goal #3 Read labels for saturated fat, trans fat and sodium.   Comments Patient has made significant positive changes in his diet since his CABG surgery       Nutrition Discharge: Rate  Your Plate Scores:     Nutrition Assessments - 02/25/16 1054    Rate Your Plate Scores   Pre Score 78   Pre Score % 87 %   Post Score 77   Post Score % 86 %   % Change -1 %      Nutrition Goals Re-Evaluation:     Nutrition Goals Re-Evaluation      02/29/16 1011 03/10/16 0943         Personal Goal #1 Re-Evaluation   Personal Goal #1  Willie Burton is eating healthier and has lost  5 lbs.      Goal Progress Seen (p) Yes Yes      Personal Goal #3 Re-Evaluation   Goal Progress Seen (p) Yes          Psychosocial: Target Goals: Acknowledge presence or absence of depression, maximize coping skills, provide positive support system. Participant is able to verbalize types and ability to use techniques and skills needed for reducing stress and depression.  Initial Review & Psychosocial Screening:     Initial Psych Review & Screening - 12/31/15 1342    Initial Review   Current issues with Current Sleep Concerns  SInce surgery ,waking up and not going back to sleep.  Is doing better since discharge.   Family Dynamics   Good Support System? Yes   Barriers   Psychosocial barriers to participate in program There are no identifiable barriers or psychosocial needs.   Screening Interventions   Interventions Encouraged to exercise      Quality of Life Scores:     Quality of Life - 02/25/16 1055    Quality of Life Scores   Health/Function Pre 28.36 %   Health/Function Post 29.14 %   Health/Function % Change 2.75 %   Socioeconomic Pre 28 %   Socioeconomic Post 30 %   Socioeconomic % Change  7.14 %   Psych/Spiritual Pre 29.64 %   Psych/Spiritual Post 30 %   Psych/Spiritual % Change 1.21 %   Family Pre 30 %   Family Post 30 %   Family % Change 0 %   GLOBAL Pre 28.85 %   GLOBAL Post 29.63 %   GLOBAL % Change 2.7 %      PHQ-9:     Recent Review Flowsheet Data    Depression screen [facility] 2/9 02/25/2016 12/31/2015   Decreased Interest 0 0   Down, Depressed, Hopeless 0 0   PHQ - 2 Score 0 0   Altered sleeping 1 1   Tired, decreased energy 0 0   Change in appetite 0 0   Feeling bad or failure about yourself  0 0   Trouble concentrating 0 0   Moving slowly or fidgety/restless 0 0   Suicidal thoughts 0 0   PHQ-9 Score 1 1   Difficult doing work/chores Not difficult at all Not difficult at all      Psychosocial Evaluation and Intervention:     Psychosocial  Evaluation - 01/14/16 0955    Psychosocial Evaluation & Interventions   Interventions Encouraged to exercise with the program and follow exercise prescription   Comments Counselor met with Mr. Senn today for initial psychosocial evaluation.  He is a 70 year old retired gentleman who had bypass surgery on 11/20/15.  He has a strong support system with a spouse of 3 years and active involvement in his local church community.  Mr. Pierron reports sleeping well and has a good appetite.  He denies a history of  depression or anxiety or any current symptoms.  He is typically in a positive mood and has been working out consistently in [employer] class for quite some time along with his spouse.  He has goals to increase his stamina and strength while in this program & be able to follow up with [facility] 3x/week.        Psychosocial Re-Evaluation:   Vocational Rehabilitation: Provide vocational rehab assistance to qualifying candidates.   Vocational Rehab Evaluation & Intervention:     Vocational Rehab - 12/31/15 1337    Initial Vocational Rehab Evaluation & Intervention   Assessment shows need for Vocational Rehabilitation No      Education: Education Goals: Education classes will be provided on a weekly basis, covering required topics. Participant will state understanding/return demonstration of topics presented.  Learning Barriers/Preferences:     Learning Barriers/Preferences - 12/31/15 1337    Learning Barriers/Preferences   Learning Barriers None   Learning Preferences None      Education Topics: General Nutrition Guidelines/Fats and Fiber: -Group instruction provided by verbal, written material, models and posters to present the general guidelines for heart healthy nutrition. Gives an explanation and review of dietary fats and fiber.          Cardiac Rehab from 03/17/2016 in [facility] Cardiac and Pulmonary Rehab   Date  02/04/16   Educator  [facility]   Instruction Review Code  2-  meets goals/outcomes      Controlling Sodium/Reading Food Labels: -Group verbal and written material supporting the discussion of sodium use in heart healthy nutrition. Review and explanation with models, verbal and written materials for utilization of the food label.      Cardiac Rehab from 03/17/2016 in [facility] Cardiac and Pulmonary Rehab   Date  01/14/16   Educator  CR   Instruction Review Code  2- meets goals/outcomes      Exercise Physiology & Risk Factors: - Group verbal and written instruction with models to review the exercise physiology of the cardiovascular system and associated critical values. Details cardiovascular disease risk factors and the goals associated with each risk factor.      Cardiac Rehab from 03/17/2016 in [facility] Cardiac and Pulmonary Rehab   Date  01/28/16   Educator  RM   Instruction Review Code  2- meets goals/outcomes      Aerobic Exercise & Resistance Training: - Gives group verbal and written discussion on the health impact of inactivity. On the components of aerobic and resistive training programs and the benefits of this training and how to safely progress through these programs.      Cardiac Rehab from 03/17/2016 in [facility] Cardiac and Pulmonary Rehab   Date  01/30/16   Educator  SW   Instruction Review Code  2- meets goals/outcomes      Flexibility, Balance, General Exercise Guidelines: - Provides group verbal and written instruction on the benefits of flexibility and balance training programs. Provides general exercise guidelines with specific guidelines to those with heart or lung disease. Demonstration and skill practice provided.      Cardiac Rehab from 03/17/2016 in [facility] Cardiac and Pulmonary Rehab   Date  02/04/16   Educator  [facility]   Instruction Review Code  2- meets goals/outcomes      Stress Management: - Provides group verbal and written instruction about the health risks of elevated stress, cause of high stress, and healthy ways to reduce  stress.      Cardiac Rehab from 03/17/2016 in [facility]  Cardiac and Pulmonary Rehab   Date  02/06/16   Educator  [facility]   Instruction Review Code  2- meets goals/outcomes      Depression: - Provides group verbal and written instruction on the correlation between heart/lung disease and depressed mood, treatment options, and the stigmas associated with seeking treatment.   Anatomy & Physiology of the Heart: - Group verbal and written instruction and models provide basic cardiac anatomy and physiology, with the coronary electrical and arterial systems. Review of: AMI, Angina, Valve disease, Heart Failure, Cardiac Arrhythmia, Pacemakers, and the ICD.   Cardiac Procedures: - Group verbal and written instruction and models to describe the testing methods done to diagnose heart disease. Reviews the outcomes of the test results. Describes the treatment choices: Medical Management, Angioplasty, or Coronary Bypass Surgery.      Cardiac Rehab from 03/17/2016 in [facility] Cardiac and Pulmonary Rehab   Date  01/21/16   Educator  SB   Instruction Review Code  2- meets goals/outcomes      Cardiac Medications: - Group verbal and written instruction to review commonly prescribed medications for heart disease. Reviews the medication, class of the drug, and side effects. Includes the steps to properly store meds and maintain the prescription regimen.      Cardiac Rehab from 03/17/2016 in [facility] Cardiac and Pulmonary Rehab   Date  02/20/16 Marcelina Morel attended 01/09/16]   Educator  SB   Instruction Review Code  2- meets goals/outcomes      Go Sex-Intimacy & Heart Disease, Get SMART - Goal Setting: - Group verbal and written instruction through game format to discuss heart disease and the return to sexual intimacy. Provides group verbal and written material to discuss and apply goal setting through the application of the S.M.A.R.T. Method.      Cardiac Rehab from 03/17/2016 in [facility] Cardiac and Pulmonary Rehab   Date  01/21/16    Educator  SB   Instruction Review Code  2- meets goals/outcomes      Other Matters of the Heart: - Provides group verbal, written materials and models to describe Heart Failure, Angina, Valve Disease, and Diabetes in the realm of heart disease. Includes description of the disease process and treatment options available to the cardiac patient.      Cardiac Rehab from 03/17/2016 in [facility] Cardiac and Pulmonary Rehab   Date  03/17/16   Educator  SB   Instruction Review Code  2- meets goals/outcomes      Exercise & Equipment Safety: - Individual verbal instruction and demonstration of equipment use and safety with use of the equipment.      Cardiac Rehab from 03/17/2016 in [facility] Cardiac and Pulmonary Rehab   Date  02/22/16   Educator  C. Enterkin [6 min walk post]   Instruction Review Code  2- meets goals/outcomes      Infection Prevention: - Provides verbal and written material to individual with discussion of infection control including proper hand washing and proper equipment cleaning during exercise session.      Cardiac Rehab from 03/17/2016 in [facility] Cardiac and Pulmonary Rehab   Date  12/31/15   Educator  SB   Instruction Review Code  2- meets goals/outcomes      Falls Prevention: - Provides verbal and written material to individual with discussion of falls prevention and safety.      Cardiac Rehab from 03/17/2016 in [facility] Cardiac and Pulmonary Rehab   Date  12/31/15   Educator  SB    Instruction  Review Code  2- meets goals/outcomes      Diabetes: - Individual verbal and written instruction to review signs/symptoms of diabetes, desired ranges of glucose level fasting, after meals and with exercise. Advice that pre and post exercise glucose checks will be done for 3 sessions at entry of program.    Knowledge Questionnaire Score:     Knowledge Questionnaire Score - 02/25/16 1057    Knowledge Questionnaire Score   Pre Score 26/28   Post Score 25/28      Core  Components/Risk Factors/Patient Goals at Admission:     Personal Goals and Risk Factors at Admission - 12/31/15 1338    Core Components/Risk Factors/Patient Goals on Admission    Weight Management Yes   Intervention (read-only) Learn and follow the exercise and diet guidelines while in the program. Utilize the nutrition and education classes to help gain knowledge of the diet and exercise expectations in the program   Admit Weight 215 lb (97.523 kg)   Goal Weight: Short Term 200 lb (90.719 kg)   Sedentary No;Yes   Intervention (read-only) While in program, learn and follow the exercise prescription taught. Start at a low level workload and increase workload after able to maintain previous level for 30 minutes. Increase time before increasing intensity.   Hypertension Yes   Goal Participant will see blood pressure controlled within the values of 140/14m/Hg or within value directed by their physician.   Intervention (read-only) Provide nutrition & aerobic exercise along with prescribed medications to achieve BP 140/90 or less.   Lipids Yes   Goal Cholesterol controlled with medications as prescribed, with individualized exercise RX and with personalized nutrition plan. Value goals: LDL < 741m HDL > 4012mParticipant states understanding of desired cholesterol values and following prescriptions.   Intervention (read-only) Provide nutrition & aerobic exercise along with prescribed medications to achieve LDL <20m42mDL >40mg53m   Core Components/Risk Factors/Patient Goals Review:      Goals and Risk Factor Review      01/14/16 1505 02/15/16 0838 02/29/16 1045 03/10/16 0943     Core Components/Risk Factors/Patient Goals Review   Personal Goals Review Increase Aerobic Exercise and Physical Activity Weight Management/Obesity;Hypertension;Lipids  Weight Management/Obesity;Lipids;Hypertension    Review  Blood pressure readings have always been good during class and Willie Burton to take BP  meds per MD recommendation. Willie Burton has blood work completed recently and does not know his current lipid levels. He did stop lipitor due to the muscle pain it was giving him and he felt more energetic and had no pain. However, per his MD he has to go back on it this week. Willie Burton 3 lbs since he started in the program and would still like to lose a few more pounds. He has been making more conscious decisions about what he eats and has been walking everyday in order to lose weight. We will follow up with him in the coming weeks to follow up on further weight loss.  Willie Burton he is feeling better and he is ready to start traveling with his wife when he has completed his 36 /345sessions of Cardiac Rehab.  Willie Burton 5lbs but he wants to lose more. Willie Burton he is doing better with muscle aches since his cholestrol lowering mechine was changed to Crestor. Ajdin's blood pressure is very stable.     Increase Aerobic Exercise and Physical Activity (read-only)   Goals Progress/Improvement seen  Yes  Comments Arien has progressed up to the full aerobic exercise time offered in class and requires no breaks. Efforts in progression are now focused on intensity through increases in worload. Izekiel states that he wants to be challenged during the program and reallly wants to exert himself. His TM and BioStep workloads have been increased and he has recently added the elliptical which is very challenging for him. We will slowly add time to the elliptical as this is the hardest machine for him currently. We will followup with him in the coming sessions to provide increases to the ellipitical .           Core Components/Risk Factors/Patient Goals at Discharge (Final Review):      Goals and Risk Factor Review - 03/10/16 0943    Core Components/Risk Factors/Patient Goals Review   Personal Goals Review Weight Management/Obesity;Lipids;Hypertension   Review Deidrick has lost 5lbs but he wants to lose  more. Zayveon reports he is doing better with muscle aches since his cholestrol lowering mechine was changed to Crestor. Khani's blood pressure is very stable.       ITP Comments:     ITP Comments      12/31/15 1436 01/22/16 0842 02/11/16 1014 02/21/16 1309 02/22/16 0852   ITP Comments Initial ITP  Continue with ITP 30 Day Review.  Continue with ITP. Demir was taken off his statin last week to see if it was contributing to his lack of energy. He stated that he has been feeling much better since stopping this medication. He will go back on this medication this coming weekend to see if his symptoms return.  30 Day Review. Continue with the ITP. Duward is going to go back to exercise in the [facility] 3 days a week and try to walk extra.     02/29/16 1010 03/14/16 0908 03/17/16 0949       ITP Comments [place] said he feels so much better and he wants to start traveling alot with his wife when he completes his Cardiac Rehab.  Cortez is taking a short run of prednisone for buring pain running along is ribs back to front of chest.  discharged today        Comments:

## 2016-03-17 NOTE — Progress Notes (Signed)
Daily Session Note  Patient Details  Name: Willie Burton MRN: [id number] Date of Birth: May 10, 1946 Referring Provider:    Encounter Date: 03/17/2016  Check In:     Session Check In - 03/17/16 0839    Check-In   Location [facility]-Cardiac & Pulmonary Rehab   Staff Present Gerlene Burdock, RN, BSN;Susanne Bice, RN, BSN, Laveda Norman, BS, ACSM CEP, Exercise Physiologist   Supervising physician immediately available to respond to emergencies See telemetry face sheet for immediately available ER MD   Medication changes reported     No   Fall or balance concerns reported    No   Warm-up and Cool-down Performed on first and last piece of equipment   Resistance Training Performed Yes   VAD Patient? No   Pain Assessment   Currently in Pain? No/denies   Multiple Pain Sites No         Goals Met:  Independence with exercise equipment Exercise tolerated well No report of cardiac concerns or symptoms Strength training completed today  Goals Unmet:  Not Applicable  Comments: Patient completed exercise prescription and all exercise goals during rehab session. The exercise was tolerated well and the patient is progressing in the program.     Dr. Emily Filbert is Medical Director for [facility] and [facility].

## 2016-03-17 NOTE — Progress Notes (Signed)
Discharge Summary  Patient Details  Name: Willie Burton MRN: [id number] Date of Birth: [birth year] Referring Provider:     Number of Visits: 36 Reason for Discharge:  Patient reached a stable level of exercise. Patient independent in their exercise.  Smoking History:  History  Smoking status  . Former Smoker -- 1.50 packs/day for 5 years  . Types: Cigarettes  Smokeless tobacco  . Never Used    Comment: Quit 40+ years ago    Diagnosis:  S/P CABG x 3  ADL UCSD:   Initial Exercise Prescription:     Initial Exercise Prescription - 12/31/15 1400    Date of Initial Exercise RX and Referring Provider   Date 12/31/15   Treadmill   MPH 2.8   Grade 0   Minutes 15   Bike   Level 1.5   Minutes 15   Recumbant Bike   Level 3   RPM 40   Minutes 15   NuStep   Level 4   Watts 50   Minutes 15   Arm Ergometer   Level 2   Watts 10   Minutes 15   Arm/Foot Ergometer   Level 2   Watts 15   Minutes 15   Cybex   Level 3   RPM 40   Minutes 15   Recumbant Elliptical   Level 2   Watts 20   Minutes 15   Elliptical   Level 1   Speed 3   Minutes 5   REL-XR   Level 4   Watts 50   Minutes 15   T5 Nustep   Level 3   Watts 30   Minutes 15   Biostep-RELP   Level 4   Watts 50   Minutes 15   Prescription Details   Frequency (times per week) 3   Duration Progress to 30 minutes of continuous aerobic without signs/symptoms of physical distress   Intensity   THRR REST +  30   Ratings of Perceived Exertion 11-15   Perceived Dyspnea 2-4   Progression   Progression Continue progressive overload as per policy without signs/symptoms or physical distress.   Resistance Training   Training Prescription Yes   Weight 2   Reps 10-15      Discharge Exercise Prescription (Final Exercise Prescription Changes):     Exercise Prescription Changes - 03/10/16 0800    Exercise Review   Progression Yes   Response to Exercise   Blood Pressure (Admit) 132/72 mmHg   Blood  Pressure (Exercise) 152/76 mmHg   Blood Pressure (Exit) 110/62 mmHg   Heart Rate (Admit) 62 bpm   Heart Rate (Exercise) 112 bpm   Heart Rate (Exit) 72 bpm   Rating of Perceived Exertion (Exercise) 13   Symptoms none   Duration Progress to 50 minutes of aerobic without signs/symptoms of physical distress   Intensity Other (comment)  110-149 bpm   Progression   Progression Continue progressive overload as per policy without signs/symptoms or physical distress.   Resistance Training   Training Prescription Yes   Weight 7   Reps 10-15   Interval Training   Interval Training Yes   Equipment Biostep-RELP   Comments Intervals recommended for patient on BioStep ranging from L5-8 with a max RPE of 15 during interval. (2 minX30 second interval)   Treadmill   MPH 3.6   Grade 2   Minutes 30  20-30 min pending duration on other machines   Elliptical   Level 6  Speed 4.6   Minutes 15   Biostep-RELP   Level 8  Intervals from level 5-8 (max 15 RPE)   Watts 90   Minutes 20      Functional Capacity:     6 Minute Walk      12/31/15 1413 02/22/16 0829     6 Minute Walk   Phase Initial Discharge    Distance 1477 feet 1600 feet    Walk Time 6 minutes 6 minutes    RPE 9 8    Symptoms No     Resting HR 67 bpm 60 bpm    Resting BP 126/72 mmHg 120/64 mmHg    Max Ex. HR 91 bpm 96 bpm    Max Ex. BP 140/80 mmHg 144/70 mmHg       Psychological, QOL, Others - Outcomes: PHQ 2/9: Depression screen [facility] 2/9 02/25/2016 12/31/2015  Decreased Interest 0 0  Down, Depressed, Hopeless 0 0  PHQ - 2 Score 0 0  Altered sleeping 1 1  Tired, decreased energy 0 0  Change in appetite 0 0  Feeling bad or failure about yourself  0 0  Trouble concentrating 0 0  Moving slowly or fidgety/restless 0 0  Suicidal thoughts 0 0  PHQ-9 Score 1 1  Difficult doing work/chores Not difficult at all Not difficult at all    Quality of Life:     Quality of Life - 02/25/16 1055    Quality of Life Scores    Health/Function Pre 28.36 %   Health/Function Post 29.14 %   Health/Function % Change 2.75 %   Socioeconomic Pre 28 %   Socioeconomic Post 30 %   Socioeconomic % Change  7.14 %   Psych/Spiritual Pre 29.64 %   Psych/Spiritual Post 30 %   Psych/Spiritual % Change 1.21 %   Family Pre 30 %   Family Post 30 %   Family % Change 0 %   GLOBAL Pre 28.85 %   GLOBAL Post 29.63 %   GLOBAL % Change 2.7 %      Personal Goals: Goals established at orientation with interventions provided to work toward goal.     Personal Goals and Risk Factors at Admission - 12/31/15 1338    Core Components/Risk Factors/Patient Goals on Admission    Weight Management Yes   Intervention (read-only) Learn and follow the exercise and diet guidelines while in the program. Utilize the nutrition and education classes to help gain knowledge of the diet and exercise expectations in the program   Admit Weight 215 lb (97.523 kg)   Goal Weight: Short Term 200 lb (90.719 kg)   Sedentary No;Yes   Intervention (read-only) While in program, learn and follow the exercise prescription taught. Start at a low level workload and increase workload after able to maintain previous level for 30 minutes. Increase time before increasing intensity.   Hypertension Yes   Goal Participant will see blood pressure controlled within the values of 140/36mm/Hg or within value directed by their physician.   Intervention (read-only) Provide nutrition & aerobic exercise along with prescribed medications to achieve BP 140/90 or less.   Lipids Yes   Goal Cholesterol controlled with medications as prescribed, with individualized exercise RX and with personalized nutrition plan. Value goals: LDL < 70mg , HDL > 40mg . Participant states understanding of desired cholesterol values and following prescriptions.   Intervention (read-only) Provide nutrition & aerobic exercise along with prescribed medications to achieve LDL 70mg , HDL >40mg .       Personal  Goals Discharge:     Goals and Risk Factor Review      01/14/16 1505 02/15/16 [id number] 02/29/16 1045 03/10/16 0943     Core Components/Risk Factors/Patient Goals Review   Personal Goals Review Increase Aerobic Exercise and Physical Activity Weight Management/Obesity;Hypertension;Lipids  Weight Management/Obesity;Lipids;Hypertension    Review  Blood pressure readings have always been good during class and Tage continues to take BP meds per MD recommendation. Jorrell has not has blood work completed recently and does not know his current lipid levels. He did stop lipitor due to the muscle pain it was giving him and he felt more energetic and had no pain. However, per his MD he has to go back on it this week. Geno has lost 3 lbs since he started in the program and would still like to lose a few more pounds. He has been making more conscious decisions about what he eats and has been walking everyday in order to lose weight. We will follow up with him in the coming weeks to follow up on further weight loss.  Callon said he is feeling better and he is ready to start traveling with his wife when he has completed his 4 /36 sessions of Cardiac Rehab.  Kazumi has lost 5lbs but he wants to lose more. Yehya reports he is doing better with muscle aches since his cholestrol lowering mechine was changed to Crestor. Franciscojavier's blood pressure is very stable.     Increase Aerobic Exercise and Physical Activity (read-only)   Goals Progress/Improvement seen  Yes       Comments Lloyde has progressed up to the full aerobic exercise time offered in class and requires no breaks. Efforts in progression are now focused on intensity through increases in worload. Aric states that he wants to be challenged during the program and reallly wants to exert himself. His TM and BioStep workloads have been increased and he has recently added the elliptical which is very challenging for him. We will slowly add time to the elliptical as this is the  hardest machine for him currently. We will followup with him in the coming sessions to provide increases to the ellipitical .           Nutrition & Weight - Outcomes:     Pre Biometrics - 12/31/15 1417    Pre Biometrics   Height 5' 9.5" (1.765 m)   Weight 215 lb 1.6 oz (97.569 kg)   Waist Circumference 42 inches   Hip Circumference 44 inches   Waist to Hip Ratio 0.95 %   BMI (Calculated) 31.4       Nutrition:     Nutrition Therapy & Goals - 01/14/16 1404    Nutrition Therapy   Diet Instructed on a heart healthy meal plan based on 1900 calories including DASH diet principles,    Drug/Food Interactions Statins/Certain Fruits   Protein (oz) (read-only) 8 ounces/day   Fiber 20 grams   Whole Grain Foods 3 servings   Saturated Fats 13 max. grams   Fruits and Vegetables 5 servings/day   Personal Nutrition Goals   Personal Goal #1 Try thin sliced cheese to help decrease intake of saturated fat.   Personal Goal #2 Increase whole grain servings with goal of at least 3 servings per day.   Personal Goal #3 Read labels for saturated fat, trans fat and sodium.   Comments Patient has made significant positive changes in his diet since his CABG surgery       Nutrition Discharge:  Nutrition Assessments - 02/25/16 1054    Rate Your Plate Scores   Pre Score 78   Pre Score % 87 %   Post Score 77   Post Score % 86 %   % Change -1 %      Education Questionnaire Score:     Knowledge Questionnaire Score - 02/25/16 1057    Knowledge Questionnaire Score   Pre Score 26/28   Post Score 25/28      Goals reviewed with patient; copy given to patient. Discharged 03/17/2016

## 2016-05-02 ENCOUNTER — Other Ambulatory Visit: Payer: Self-pay | Admitting: Internal Medicine

## 2016-05-02 ENCOUNTER — Other Ambulatory Visit (HOSPITAL_COMMUNITY): Payer: Self-pay | Admitting: Internal Medicine

## 2016-05-02 DIAGNOSIS — M79651 Pain in right thigh: Secondary | ICD-10-CM

## 2016-05-07 ENCOUNTER — Ambulatory Visit
Admission: RE | Admit: 2016-05-07 | Discharge: 2016-05-07 | Disposition: A | Discharge status: Home / Self Care | Payer: Medicare Other | Source: Ambulatory Visit | Attending: Internal Medicine | Admitting: Internal Medicine

## 2016-05-07 DIAGNOSIS — M79651 Pain in right thigh: Secondary | ICD-10-CM | POA: Insufficient documentation

## 2016-05-27 ENCOUNTER — Ambulatory Visit: Payer: Medicare Other | Attending: Orthopedic Surgery | Admitting: Physical Therapy

## 2016-05-27 DIAGNOSIS — M6281 Muscle weakness (generalized): Secondary | ICD-10-CM

## 2016-05-27 DIAGNOSIS — M25551 Pain in right hip: Secondary | ICD-10-CM | POA: Diagnosis not present

## 2016-05-28 NOTE — Therapy (Signed)
[facility] [address] [place], [address] Phone: [phone]   Fax:  [phone]  Physical Therapy Evaluation  Patient Details  Name: Willie Burton MRN: [id number] Date of Birth: 1946-02-16 Referring Provider: Arvella Nigh  Encounter Date: 05/27/2016      PT End of Session - 05/27/16 1057    Visit Number 1   Number of Visits 9   Date for PT Re-Evaluation 06/24/16   Authorization Type g code   PT Start Time 0945   PT Stop Time [id number]   PT Time Calculation (min) 50 min   Activity Tolerance Patient tolerated treatment well      Past Medical History  Diagnosis Date  . High blood pressure   . Prostate cancer ([place])   . High cholesterol   . 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 Arthritis   . Hyperlipemia     Past Surgical History  Procedure Laterality Date  . Ulnar nerve transposition Left January 2015  . Cataract extraction    . Appendectomy    . Colon surgery    . Lipoma excision Right     thigh  . Metacarpophalangeal joint arthroplasty    . Cardiac catheterization N/A 11/13/2015    Procedure: Left Heart Cath and Coronary Angiography;  Surgeon: Teodoro Spray, MD;  Location: [facility];  Service: Cardiovascular;  Laterality: N/A;    There were no vitals filed for this visit.       Subjective Assessment - 05/27/16 1003    Subjective Pt reports he has R hip pain which began after he had a CABG surgery and began working out. working out worsens pain, pain is tied solely to activity. He is running daily and is doing a fitness routine which may be aggravating pain. Pt reports there is no day where he lacks pain bu the is able to manage it with activity modification. Due to recent cardiac issues pt feels strongly about the need to continue with exercise but is currently unable to.   Pertinent History History of chronic back pain, some R leg symptoms in the past.    Limitations  Sitting;Walking;Lifting   How long can you sit comfortably? 1 hr   How long can you stand comfortably? irritating but pt is able to stand as needed   How long can you walk comfortably? walking is irritating   Diagnostic tests imaging - negative for arthritis   Patient Stated Goals return to exercise.   Currently in Pain? Yes   Pain Score 2    Pain Location Buttocks   Pain Orientation Right            [facility] PT Assessment - 05/27/16 0001    Assessment   Medical Diagnosis primary osteoarthritis of right hip, IT band syndrome   Referring Provider [place]   Hand Dominance Right   Prior Therapy none   Precautions   Precautions None   Restrictions   Weight Bearing Restrictions No   Balance Screen   Has the patient fallen in the past 6 months No   Has the patient had a decrease in activity level because of a fear of falling?  No   Is the patient reluctant to leave their home because of a fear of falling?  No   Prior Function   Level of Independence Independent   Vocation Retired   Leisure walking, exercising, running.   Observation/Other Assessments   Observations  scar noted on R thigh whch pt reports is due to surgery 25 yrs ago.   ROM / Strength   AROM / PROM / Strength AROM;Strength   AROM   Overall AROM Comments R hip rotation limited with ER and IR, pain at end range with both. R SLR to 30 deg, R to 60. pain on R. deferred lumbar ROM. Knee ROM is WNL. ankle ROM is WNL except DF limited on R.   Strength   Overall Strength Comments pain with assessment of R hip, 3/ 5 strength for hip flexion, abduction, adduction, ER. IR not tested. Knee extension 4/5 vs 5/5 on L. All L hip tested 5/5, unable to test abduction due to pain with positioning.    Palpation   Palpation comment reproduction of pain with palpation of IT band, glute med, glute min, greater trochanter   Transfers   Comments pain with sit<>stand   Ambulation/Gait   Gait Comments antalgic gait. limited and painful SLS  to 10 sec. pain with ascending stairs and notably poor femoral control          Objective: Educated pt in strategies to avoid compression of IT band/greater trochanter including avoiding sleeping on R side, crossing legs, performing certain stretches.  Seated isometric for glute activation, seated ER press into chair. Extensive cuing for no greater than 30% MVC, 8 sec. Holds, how to monitor symptoms based on this.  Ascending stairs, cuing for improved femoral control/avoiding knee valgus with performance. Pt reported decr. Pain with this cuing. PT encouraged pt to take one week to practice this but to overall avoid stairs.  Educated to avoid irritating hip seated exercise which he is performing at home.  SLS practice.  Pt required cuing and education on performance of exercises, how to monitor for pain, and things to avoid due to apparent reactive tendinopathy                 PT Education - 05/27/16 1055    Education provided Yes   Education Details educated pt on avoiding irritating tension on IT band/greater trochanter, isometrics   Person(s) Educated Patient   Methods Explanation   Comprehension Verbalized understanding             PT Long Term Goals - 05/27/16 1103    PT LONG TERM GOAL #1   Title Pt will report 0/10 pain with performance of cardiac rehab/forever fit exercises   Baseline 4/10   Time 4   Period Weeks   Status New   PT LONG TERM GOAL #2   Title Pt will improve R hip ROM to = L indicating improvement in functional capacity   Baseline significantly limited   Time 4   Period Weeks   Status New   PT LONG TERM GOAL #3   Title Pt will be able to ascend stairs pain free   Baseline pain with stairs   Time 4   Period Weeks   Status New               Plan - 05/27/16 1058    Clinical Impression Statement Pt is a pleasant 70 y/o male with c/o 4 month history of R hip pain. Pain began after pt began working out regularly s/p CABG and  pain has worsened over time. Pt has pain with crossing legs and stairs. in addition to running, exercising. Pt curently presents with pain, muscle weakness, tenderness to palpation, and difficulty walking. Pt would benefit from skilled PT to address  these issues and allow return to PLOF.   Rehab Potential Good   Clinical Impairments Affecting Rehab Potential subacute on chronic, active lifestyle   PT Frequency 2x / week   PT Duration 4 weeks   PT Treatment/Interventions ADLs/Self Care Home Management;Patient/family education;Dry needling;Therapeutic activities;Therapeutic exercise   Consulted and Agree with Plan of Care Patient      Patient will benefit from skilled therapeutic intervention in order to improve the following deficits and impairments:  Pain, Difficulty walking, Improper body mechanics, Decreased range of motion, Decreased strength  Visit Diagnosis: Pain in right hip - Plan: PT plan of care cert/re-cert  Muscle weakness (generalized) - Plan: PT plan of care cert/re-cert      G-Codes - [id number]    Functional Assessment Tool Used NPRS, stairs   Functional Limitation Mobility: Walking and moving around   Mobility: Walking and Moving Around Current Status [phone]) At least 20 percent but less than 40 percent impaired, limited or restricted   Mobility: Walking and Moving Around Goal Status [phone]) At least 1 percent but less than 20 percent impaired, limited or restricted   Mobility: Walking and Moving Around Discharge Status [phone]) At least 1 percent but less than 20 percent impaired, limited or restricted       Problem List Patient Active Problem List   Diagnosis Date Noted  . H/O coronary artery bypass surgery 11/20/2015  . 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 Ulnar neuropathy of left upper extremity 10/13/2014  . BP (high blood pressure) 04/26/2014  . HLD (hyperlipidemia) 04/26/2014  . AC  (acromioclavicular) arthritis 04/04/2013    Iana Buzan PT DPT 05/28/2016, 7:10 AM  [facility] [address], [place], [address] Phone: [phone]   Fax:  [phone]  Name: KENYADA HOMEIER MRN: [id number] Date of Birth: September 29, 1946

## 2016-06-05 ENCOUNTER — Ambulatory Visit: Payer: Medicare Other | Attending: Orthopedic Surgery | Admitting: Physical Therapy

## 2016-06-05 DIAGNOSIS — M25551 Pain in right hip: Secondary | ICD-10-CM | POA: Diagnosis present

## 2016-06-05 DIAGNOSIS — M6281 Muscle weakness (generalized): Secondary | ICD-10-CM | POA: Insufficient documentation

## 2016-06-05 NOTE — Therapy (Signed)
[facility] [address], [place], [address] Phone: [phone]   Fax:  [phone]  Physical Therapy Treatment  Patient Details  Name: Willie Burton MRN: [id number] Date of Birth: [birth year] Referring Provider: Arvella Nigh  Encounter Date: 06/05/2016      PT End of Session - 06/05/16 1119    Visit Number 2   Number of Visits 9   Date for PT Re-Evaluation 06/24/16   Authorization Type g code   PT Start Time 1030   PT Stop Time 1108   PT Time Calculation (min) 38 min   Activity Tolerance Patient tolerated treatment well      Past Medical History  Diagnosis Date  . High blood pressure   . Prostate cancer ([place])   . High cholesterol   . 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 Arthritis   . Hyperlipemia     Past Surgical History  Procedure Laterality Date  . Ulnar nerve transposition Left January 2015  . Cataract extraction    . Appendectomy    . Colon surgery    . Lipoma excision Right     thigh  . Metacarpophalangeal joint arthroplasty    . Cardiac catheterization N/A 11/13/2015    Procedure: Left Heart Cath and Coronary Angiography;  Surgeon: Teodoro Spray, MD;  Location: [facility];  Service: Cardiovascular;  Laterality: N/A;    There were no vitals filed for this visit.      Subjective Assessment - 06/05/16 1108    Subjective Pt reports he is doing significantly better today, he has been resting but is interested in getting back to running. He has had no significant pain since previous session.   Pertinent History History of chronic back pain, some R leg symptoms in the past.    Limitations Sitting;Walking;Lifting   How long can you sit comfortably? 1 hr   How long can you stand comfortably? irritating but pt is able to stand as needed   How long can you walk comfortably? walking is irritating   Diagnostic tests imaging - negative for arthritis   Patient Stated Goals  return to exercise.   Currently in Pain? No/denies   Pain Score 0-No pain              Objective: SL with pillow between knees performed STM on vastus lateralis, IT band, gluteal and piriformis region. Utilized petrissage, compression, cross friction, and oscillatory techniques.  Following this pt reported decr. Irritation when performing SLS.  Performed SLS, noted improvement in pelvic control with this, 4 min total.  Lateral steps/heel taps 3x10 performed B for control and glute activation. Noted decr. Femoral "wobble" compared with stepping at eval.  SL hip abduction practice with cuing for decr. TFL activation. Instructed pt in performance of this at home.  Discussed pt desire to return to running. Educated pt on return to running program starting with 5 min warmup, 1 min run, 3 min walk repeated 5 times, then 5 min cool down. To be performed every other day for 1 week, if pain free then progressing to 2 min run 3 min walk from there.  Pt expressed concern regarding pain when he leans on R side - pt again reminded pt to avoid compression activities including leaning on this side.                   PT Education - 06/05/16 1108  Education provided Yes   Education Details educated pt on return to running program.   Person(s) Educated Patient   Methods Explanation   Comprehension Verbalized understanding             PT Long Term Goals - 05/27/16 1103    PT LONG TERM GOAL #1   Title Pt will report 0/10 pain with performance of cardiac rehab/forever fit exercises   Baseline 4/10   Time 4   Period Weeks   Status New   PT LONG TERM GOAL #2   Title Pt will improve R hip ROM to = L indicating improvement in functional capacity   Baseline significantly limited   Time 4   Period Weeks   Status New   PT LONG TERM GOAL #3   Title Pt will be able to ascend stairs pain free   Baseline pain with stairs   Time 4   Period Weeks   Status New                Plan - 06/05/16 1122    Clinical Impression Statement Pt has made considerable progress in terms of pain. Able to confidently stand on single leg, able to perform stairs with no pain, gait with dminished but still present antalgia. PT issued return to running program for use on elliptical. He is to continue to avoid irritation for one additional week including compression and tensioning positions/activities, as well as previous chair hip exercise. Pt is continuing to have some activity limitation and is highly painful to palpation so will continue to treat this.   Rehab Potential Good   Clinical Impairments Affecting Rehab Potential subacute on chronic, active lifestyle   PT Frequency 2x / week   PT Duration 4 weeks   PT Treatment/Interventions ADLs/Self Care Home Management;Patient/family education;Dry needling;Therapeutic activities;Therapeutic exercise   Consulted and Agree with Plan of Care Patient      Patient will benefit from skilled therapeutic intervention in order to improve the following deficits and impairments:  Pain, Difficulty walking, Improper body mechanics, Decreased range of motion, Decreased strength  Visit Diagnosis: Pain in right hip  Muscle weakness (generalized)     Problem List Patient Active Problem List   Diagnosis Date Noted  . H/O coronary artery bypass surgery 11/20/2015  . 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 Ulnar neuropathy of left upper extremity 10/13/2014  . BP (high blood pressure) 04/26/2014  . HLD (hyperlipidemia) 04/26/2014  . AC (acromioclavicular) arthritis 04/04/2013    Fisher,Benjamin PT DPT 06/05/2016, 11:49 AM  [facility] [address] [place], [address] Phone: [phone]   Fax:  [phone]  Name: Willie Burton MRN: [id number] Date of Birth: 09/07/1946

## 2016-06-10 ENCOUNTER — Ambulatory Visit: Payer: Medicare Other | Admitting: Physical Therapy

## 2016-06-10 DIAGNOSIS — M6281 Muscle weakness (generalized): Secondary | ICD-10-CM

## 2016-06-10 DIAGNOSIS — M25551 Pain in right hip: Secondary | ICD-10-CM | POA: Diagnosis not present

## 2016-06-10 NOTE — Therapy (Signed)
[facility] [address], [place], [address] Phone: [phone]   Fax:  [phone]  Physical Therapy Treatment  Patient Details  Name: Willie Burton MRN: [id number] Date of Birth: 12/06/1945 Referring Provider: Arvella Nigh  Encounter Date: 06/10/2016      PT End of Session - 06/10/16 0905    Visit Number 3   Number of Visits 9   Date for PT Re-Evaluation 06/24/16   Authorization Type g code   PT Start Time 0900   PT Stop Time 0940   PT Time Calculation (min) 40 min   Activity Tolerance Patient tolerated treatment well      Past Medical History  Diagnosis Date  . High blood pressure   . Prostate cancer ([place])   . High cholesterol   . 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 Arthritis   . Hyperlipemia     Past Surgical History  Procedure Laterality Date  . Ulnar nerve transposition Left January 2015  . Cataract extraction    . Appendectomy    . Colon surgery    . Lipoma excision Right     thigh  . Metacarpophalangeal joint arthroplasty    . Cardiac catheterization N/A 11/13/2015    Procedure: Left Heart Cath and Coronary Angiography;  Surgeon: Teodoro Spray, MD;  Location: [facility];  Service: Cardiovascular;  Laterality: N/A;    There were no vitals filed for this visit.      Subjective Assessment - 06/10/16 0904    Subjective Pt reports he has been running, no difficulty with this. He did roll over on it yesterday and had incr. pain.   Pertinent History History of chronic back pain, some R leg symptoms in the past.    Limitations Sitting;Walking;Lifting   How long can you sit comfortably? 1 hr   How long can you stand comfortably? irritating but pt is able to stand as needed   How long can you walk comfortably? walking is irritating   Diagnostic tests imaging - negative for arthritis   Patient Stated Goals return to exercise.   Currently in Pain? No/denies   Pain  Score 0-No pain             Objective: Medial hip glides 3x1 min grade II with hip positioned in abduction. Following this pain incr. Improved with amb.  STM extensively performed on peri-hip region. Following this pain fully improved back to 0/10.  Reviewed SLS, educated pt on performing hip circles once SLS becomes easier, gave pt instruction on how to gauge this if it happens while he is on vacation.  Issued and had pt perform limited forward lunge with cuing to notice the difference between one side and the other. Pt lands heavier and with more difficulty with RLE moving forward so focused on improving this.  Pt required cuing and demonstration of exercise, would benefit from continued pratice of this at home. Look to reassess at next session.                    PT Education - 06/10/16 0905    Education provided Yes   Education Details Joint mobs   Person(s) Educated Patient   Methods Explanation   Comprehension Verbalized understanding             PT Long Term Goals - 05/27/16 1103    PT LONG TERM GOAL #1   Title Pt will  report 0/10 pain with performance of cardiac rehab/forever fit exercises   Baseline 4/10   Time 4   Period Weeks   Status New   PT LONG TERM GOAL #2   Title Pt will improve R hip ROM to = L indicating improvement in functional capacity   Baseline significantly limited   Time 4   Period Weeks   Status New   PT LONG TERM GOAL #3   Title Pt will be able to ascend stairs pain free   Baseline pain with stairs   Time 4   Period Weeks   Status New               Plan - 06/10/16 [id number]    Clinical Impression Statement Pt appears to be continuing to make improvement in terms of functional ability. He does report continued pain with crossing legs and with rolling onto hip, but every day experience of pain is much better. Attempted joint mobs today which incr. pt pain, so gentle STM performed following this to return pain to  lower level.. Issued new HEP to address poor balance/femoral control when using RLE.   Rehab Potential Good   Clinical Impairments Affecting Rehab Potential subacute on chronic, active lifestyle   PT Frequency 2x / week   PT Duration 4 weeks   PT Treatment/Interventions ADLs/Self Care Home Management;Patient/family education;Dry needling;Therapeutic activities;Therapeutic exercise   Consulted and Agree with Plan of Care Patient      Patient will benefit from skilled therapeutic intervention in order to improve the following deficits and impairments:  Pain, Difficulty walking, Improper body mechanics, Decreased range of motion, Decreased strength  Visit Diagnosis: Pain in right hip  Muscle weakness (generalized)     Problem List Patient Active Problem List   Diagnosis Date Noted  . H/O coronary artery bypass surgery 11/20/2015  . 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 Ulnar neuropathy of left upper extremity 10/13/2014  . BP (high blood pressure) 04/26/2014  . HLD (hyperlipidemia) 04/26/2014  . AC (acromioclavicular) arthritis 04/04/2013    Fisher,Benjamin PT DPT 06/10/2016, 9:51 AM  [facility] [address], [place], [address] Phone: [phone]   Fax:  [phone]  Name: Willie Burton MRN: [id number] Date of Birth: July 23, 1946

## 2016-06-12 ENCOUNTER — Encounter: Payer: Medicare Other | Admitting: Physical Therapy

## 2016-06-17 ENCOUNTER — Encounter: Payer: Medicare Other | Admitting: Physical Therapy

## 2016-06-19 ENCOUNTER — Ambulatory Visit: Payer: Medicare Other | Admitting: Physical Therapy

## 2016-06-19 DIAGNOSIS — M25551 Pain in right hip: Secondary | ICD-10-CM | POA: Diagnosis not present

## 2016-06-19 DIAGNOSIS — M6281 Muscle weakness (generalized): Secondary | ICD-10-CM

## 2016-06-19 NOTE — Therapy (Signed)
[facility] [address], [place], [address] Phone: [phone]   Fax:  [phone]  Physical Therapy Treatment  Patient Details  Name: Willie Burton MRN: [id number] Date of Birth: 1946/09/02 Referring Provider: Arvella Nigh  Encounter Date: 06/19/2016      PT End of Session - 06/19/16 0948    Visit Number 4   Number of Visits 9   Date for PT Re-Evaluation 06/24/16   Authorization Type g code   PT Start Time 0945   PT Stop Time 1030   PT Time Calculation (min) 45 min   Activity Tolerance Patient tolerated treatment well      Past Medical History  Diagnosis Date  . High blood pressure   . Prostate cancer ([place])   . High cholesterol   . 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 Arthritis   . Hyperlipemia     Past Surgical History  Procedure Laterality Date  . Ulnar nerve transposition Left January 2015  . Cataract extraction    . Appendectomy    . Colon surgery    . Lipoma excision Right     thigh  . Metacarpophalangeal joint arthroplasty    . Cardiac catheterization N/A 11/13/2015    Procedure: Left Heart Cath and Coronary Angiography;  Surgeon: Teodoro Spray, MD;  Location: [facility];  Service: Cardiovascular;  Laterality: N/A;    There were no vitals filed for this visit.      Subjective Assessment - 06/19/16 0946    Subjective Pt reports he is continuing to have anterior thigh, lateral thigh pain, and is continuing to limp.    Pertinent History History of chronic back pain, some R leg symptoms in the past.    Limitations Sitting;Walking;Lifting   How long can you sit comfortably? 1 hr   How long can you stand comfortably? irritating but pt is able to stand as needed   How long can you walk comfortably? walking is irritating   Diagnostic tests imaging - negative for arthritis   Patient Stated Goals return to exercise.   Currently in Pain? Yes   Pain Score 4    Pain  Location Leg   Pain Orientation Right              Objective: Trigger point dry needling - pt consented to tx, informed of precautions and side effects. Performed on rectus femoris, vastus lateralis, following this pt had improvement in pain with gait.  amb in clinic 5x50' with cuing for improving knee flexion.  Distraction for residual pain in anterior thigh, performed for 5 min total. Pt reported no pain following this.  Performed hip flexor standing stretch 3x1 min.  Performed standing quad stretch 3x1 min.   Pt required cuing for performance of stretches to be appropriate.                   PT Education - 06/19/16 [phone]    Education provided Yes   Education Details pattern recognition with pain   Person(s) Educated Patient   Methods Explanation   Comprehension Verbalized understanding             PT Long Term Goals - 05/27/16 1103    PT LONG TERM GOAL #1   Title Pt will report 0/10 pain with performance of cardiac rehab/forever fit exercises   Baseline 4/10   Time 4   Period Weeks   Status New  PT LONG TERM GOAL #2   Title Pt will improve R hip ROM to = L indicating improvement in functional capacity   Baseline significantly limited   Time 4   Period Weeks   Status New   PT LONG TERM GOAL #3   Title Pt will be able to ascend stairs pain free   Baseline pain with stairs   Time 4   Period Weeks   Status New               Plan - 06/19/16 1032    Clinical Impression Statement Pt is having incr. anterior thigh pain which he attributes to previous hip medial glides. Performed hip distraction glides which improved pain considerably. pt responded well to trigger point dry needling with improvement in pain. PT encouraged pt to continue with his exercises and to add in femoral nerve/quad stretches.    Rehab Potential Good   Clinical Impairments Affecting Rehab Potential subacute on chronic, active lifestyle   PT Frequency 2x / week    PT Duration 4 weeks   PT Treatment/Interventions ADLs/Self Care Home Management;Patient/family education;Dry needling;Therapeutic activities;Therapeutic exercise   Consulted and Agree with Plan of Care Patient      Patient will benefit from skilled therapeutic intervention in order to improve the following deficits and impairments:  Pain, Difficulty walking, Improper body mechanics, Decreased range of motion, Decreased strength  Visit Diagnosis: Pain in right hip  Muscle weakness (generalized)     Problem List Patient Active Problem List   Diagnosis Date Noted  . H/O coronary artery bypass surgery 11/20/2015  . 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 Ulnar neuropathy of left upper extremity 10/13/2014  . BP (high blood pressure) 04/26/2014  . HLD (hyperlipidemia) 04/26/2014  . AC (acromioclavicular) arthritis 04/04/2013    Keyira Mondesir PT DPT 06/19/2016, 10:49 AM  [facility] [address], [place], [address] Phone: [phone]   Fax:  [phone]  Name: Willie Burton MRN: [id number] Date of Birth: 02/11/1946

## 2016-06-24 ENCOUNTER — Ambulatory Visit: Payer: Medicare Other | Admitting: Physical Therapy

## 2016-06-24 DIAGNOSIS — M25551 Pain in right hip: Secondary | ICD-10-CM

## 2016-06-24 NOTE — Therapy (Signed)
[facility] [address], [place], [address] Phone: [phone]   Fax:  [phone]    Pt not seen for session as he is doing well and requested to go to his forever fit exercise class. Re-scheduled for session in 3 weeks to ensure continued improvement is maintained for hip. All goals met and LEFS is now 77/80. No charges for session   Patient Details  Name: Willie Burton MRN: [id number] Date of Birth: 09-16-1946 Referring Provider:  Duanne Guess, PA-C  Encounter Date: 06/24/2016   Caryn Section PT DPT 06/24/2016, 8:28 AM  [facility] [address] [place], [address] Phone: [phone]   Fax:  [phone]

## 2016-06-26 ENCOUNTER — Encounter: Payer: Medicare Other | Admitting: Physical Therapy

## 2016-07-16 ENCOUNTER — Ambulatory Visit: Payer: Medicare Other | Attending: Orthopedic Surgery | Admitting: Physical Therapy

## 2016-07-16 DIAGNOSIS — M25551 Pain in right hip: Secondary | ICD-10-CM | POA: Diagnosis present

## 2016-07-16 NOTE — Therapy (Signed)
[facility] [address] [place], [address] Phone: [phone]   Fax:  [phone]  Physical Therapy Treatment  Patient Details  Name: LINO WICKLIFF MRN: [id number] Date of Birth: 12-18-1945 Referring Provider: Arvella Nigh  Encounter Date: 07/16/2016      PT End of Session - 07/16/16 0845    Visit Number 6   Number of Visits 9   Date for PT Re-Evaluation 06/24/16   Authorization Type g code   PT Start Time 0815   PT Stop Time 8546   PT Time Calculation (min) 29 min   Activity Tolerance Patient tolerated treatment well      Past Medical History:  Diagnosis Date  . 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 Arthritis   . High blood pressure   . High cholesterol   . Hyperlipemia   . Prostate cancer [facility])     Past Surgical History:  Procedure Laterality Date  . APPENDECTOMY    . CARDIAC CATHETERIZATION N/A 11/13/2015   Procedure: Left Heart Cath and Coronary Angiography;  Surgeon: Teodoro Spray, MD;  Location: [place] [facility];  Service: Cardiovascular;  Laterality: N/A;  . CATARACT EXTRACTION    . COLON SURGERY    . LIPOMA EXCISION Right    thigh  . METACARPOPHALANGEAL JOINT ARTHROPLASTY    . ULNAR NERVE TRANSPOSITION Left January 2015    There were no vitals filed for this visit.      Subjective Assessment - 07/16/16 0816    Subjective Pt reports he is having mild pain and a light limp on R, overall is feeling well.   Pertinent History History of chronic back pain, some R leg symptoms in the past.    Limitations Sitting;Walking;Lifting   How long can you sit comfortably? 1 hr   How long can you stand comfortably? irritating but pt is able to stand as needed   How long can you walk comfortably? walking is irritating   Diagnostic tests imaging - negative for arthritis   Patient Stated Goals return to exercise.   Currently in Pain? No/denies                Objective: Quad stretch in standing with cuing for improving lateral quad stretch.  Following this pt had decr. Pain in quad with walking after sitting.  Supine bridge 3x5, with cuing to push through heels to achieve glute activation.  Standing pelvic tilt, pelvic tilt with wt shift. This to address pain when pt is standing for extended periods of time which is causing his back to spasm and referring pain down the leg.  Pt able to demonstrate exercises, confident that he is able to continue to address his symptoms on his own at this time.                  PT Education - 07/16/16 0845    Education provided Yes   Education Details d/c instructions   Person(s) Educated Patient   Methods Explanation   Comprehension Verbalized understanding             PT Long Term Goals - 06/24/16 0820      PT LONG TERM GOAL #1   Title Pt will report 0/10 pain with performance of cardiac rehab/forever fit exercises   Baseline 4/10   Time 4   Period Weeks   Status Achieved     PT LONG TERM GOAL #2   Title  Pt will improve R hip ROM to = L indicating improvement in functional capacity   Baseline significantly limited   Time 4   Period Weeks   Status Achieved     PT LONG TERM GOAL #3   Title Pt will be able to ascend stairs pain free   Baseline pain with stairs   Time 4   Period Weeks   Status Achieved               Plan - 2016/08/04 0845    Clinical Impression Statement At this time pt is appropriate for d/c from PT. Pt does have continued pain in R LE but he is able to manage it with stretching and activity modification. Goals have been met but pt does have mild limp only when he performs slow walking for extended periods of time.   Rehab Potential Good   Clinical Impairments Affecting Rehab Potential subacute on chronic, active lifestyle   PT Frequency 2x / week   PT Duration 4 weeks   PT Treatment/Interventions ADLs/Self Care Home  Management;Patient/family education;Dry needling;Therapeutic activities;Therapeutic exercise   Consulted and Agree with Plan of Care Patient      Patient will benefit from skilled therapeutic intervention in order to improve the following deficits and impairments:  Pain, Difficulty walking, Improper body mechanics, Decreased range of motion, Decreased strength  Visit Diagnosis: Pain in right hip - Plan: PT plan of care cert/re-cert       G-Codes - August 04, 2016 0847    Functional Assessment Tool Used NPRS, stairs   Functional Limitation Mobility: Walking and moving around   Mobility: Walking and Moving Around Current Status (T9774) At least 1 percent but less than 20 percent impaired, limited or restricted   Mobility: Walking and Moving Around Discharge Status [phone]) At least 1 percent but less than 20 percent impaired, limited or restricted      Problem List Patient Active Problem List   Diagnosis Date Noted  . H/O coronary artery bypass surgery 11/20/2015  . 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 Ulnar neuropathy of left upper extremity 10/13/2014  . BP (high blood pressure) 04/26/2014  . HLD (hyperlipidemia) 04/26/2014  . AC (acromioclavicular) arthritis 04/04/2013    Shigeo Baugh PT DPT 2016/08/04, 8:51 AM  [facility] [address] [place], [address] Phone: [phone]   Fax:  [phone]  Name: TINA TEMME MRN: [id number] Date of Birth: 12/13/1945

## 2017-03-20 DIAGNOSIS — H93A3 Pulsatile tinnitus, bilateral: Secondary | ICD-10-CM | POA: Insufficient documentation

## 2017-03-20 DIAGNOSIS — H833X3 Noise effects on inner ear, bilateral: Secondary | ICD-10-CM | POA: Insufficient documentation

## 2017-03-20 DIAGNOSIS — H9113 Presbycusis, bilateral: Secondary | ICD-10-CM | POA: Insufficient documentation

## 2017-03-31 ENCOUNTER — Other Ambulatory Visit (HOSPITAL_COMMUNITY): Payer: Self-pay | Admitting: Otolaryngology

## 2017-03-31 DIAGNOSIS — H93A9 Pulsatile tinnitus, unspecified ear: Secondary | ICD-10-CM

## 2017-04-03 ENCOUNTER — Ambulatory Visit (HOSPITAL_COMMUNITY): Payer: Medicare Other

## 2017-05-05 ENCOUNTER — Encounter (INDEPENDENT_AMBULATORY_CARE_PROVIDER_SITE_OTHER): Payer: Self-pay | Admitting: Vascular Surgery

## 2017-05-05 ENCOUNTER — Ambulatory Visit (INDEPENDENT_AMBULATORY_CARE_PROVIDER_SITE_OTHER): Payer: Medicare Other | Admitting: Vascular Surgery

## 2017-05-05 VITALS — BP 143/78 | HR 50 | Resp 16 | Ht 69.0 in | Wt 209.0 lb

## 2017-05-05 DIAGNOSIS — E785 Hyperlipidemia, unspecified: Secondary | ICD-10-CM | POA: Diagnosis not present

## 2017-05-05 DIAGNOSIS — I6523 Occlusion and stenosis of bilateral carotid arteries: Secondary | ICD-10-CM

## 2017-05-05 DIAGNOSIS — I10 Essential (primary) hypertension: Secondary | ICD-10-CM | POA: Diagnosis not present

## 2017-05-05 DIAGNOSIS — I6529 Occlusion and stenosis of unspecified carotid artery: Secondary | ICD-10-CM | POA: Insufficient documentation

## 2017-05-05 NOTE — Assessment & Plan Note (Signed)
Although a statin was listed in his medicines, he says he is not taking them as he has tried and is intolerant. He understands that cholesterol management is important to reducing progression of disease.

## 2017-05-05 NOTE — Progress Notes (Signed)
Patient ID: Willie Burton, male   DOB: 15-Oct-1946, 71 y.o.   MRN: [id number]  Chief Complaint  Patient presents with  . New Evaluation    CAS    HPI Willie Burton is a 71 y.o. male.  I am asked to see the patient by Dr. Doy Hutching for evaluation of carotid artery stenosis.  The patient reports . Tinnitus symptoms worse on the right than the left that have actually been present for months to years now. They have become more noticeable over the past couple of years. He has had some vague visual symptoms and has had a procedure on his left eye for neovascularization. He reports no clear amaurosis fugax. He denies any stroke or TIA type symptoms such as arm or leg weakness or numbness, speech or swallowing difficulty, or temporary monocular blindness. He does have pain in his right calf with cramping with activity and has been worked up for PAD which reports are received his ABIs were normal. He does not have ulceration or infection. He has a previous history of tobacco use but not many years. He is treated for high blood pressure. He is intolerant to statins. He is artery had coronary artery interventions. A carotid duplex demonstrated mild plaque in the right carotid artery with velocities showing no greater than 50% stenosis. His left carotid artery demonstrated moderate plaque with velocities that were read in the 50-70% range by duplex criteria.   Past Medical History:  Diagnosis Date  . 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 Arthritis   . High blood pressure   . High cholesterol   . Hyperlipemia   . Prostate cancer [facility])     Past Surgical History:  Procedure Laterality Date  . APPENDECTOMY    . CARDIAC CATHETERIZATION N/A 11/13/2015   Procedure: Left Heart Cath and Coronary Angiography;  Surgeon: Teodoro Spray, MD;  Location: [facility];  Service: Cardiovascular;  Laterality: N/A;  . CATARACT EXTRACTION    . COLON SURGERY    . LIPOMA EXCISION Right    thigh  .  METACARPOPHALANGEAL JOINT ARTHROPLASTY    . ULNAR NERVE TRANSPOSITION Left January 2015    Family History  Problem Relation Age of Onset  . Throat cancer Father        Deceased, 84s  . COPD Mother        Deceased, 34  . Healthy Brother   . Healthy Daughter   . Healthy Son   No bleeding or clotting disorders  Social History Social History  Substance Use Topics  . Smoking status: Former Smoker    Packs/day: 1.50    Years: 5.00    Types: Cigarettes  . Smokeless tobacco: Never Used     Comment: Quit 40+ years ago  . Alcohol use No  No IV drug use  Allergies  Allergen Reactions  . Hepatitis A Antigen   . Typhoid Vaccines   . Amoxicillin Rash    Current Outpatient Prescriptions  Medication Sig Dispense Refill  . aspirin EC 81 MG tablet Take by mouth daily.     Marland Kitchen atorvastatin (LIPITOR) 40 MG tablet Take 40 mg by mouth daily.    Marland Kitchen ibuprofen (ADVIL,MOTRIN) 600 MG tablet 600 mg every 4 (four) hours as needed for mild pain. Reported on 12/31/2015    . metoprolol succinate (TOPROL-XL) 50 MG 24 hr tablet Take 50 mg by mouth daily. Reported on 12/31/2015    . vitamin B-12 (CYANOCOBALAMIN) 1000  MCG tablet Take 1,000 mcg by mouth daily. Reported on 12/31/2015     No current facility-administered medications for this visit.       REVIEW OF SYSTEMS (Negative unless checked)  Constitutional: [] Weight loss  [] Fever  [] Chills Cardiac: [] Chest pain   [] Chest pressure   [] Palpitations   [] Shortness of breath when laying flat   [] Shortness of breath at rest   [] Shortness of breath with exertion. Vascular:  [x] Pain in legs with walking   [] Pain in legs at rest   [] Pain in legs when laying flat   [] Claudication   [] Pain in feet when walking  [] Pain in feet at rest  [] Pain in feet when laying flat   [] History of DVT   [] Phlebitis   [] Swelling in legs   [] Varicose veins   [] Non-healing ulcers Pulmonary:   [] Uses home oxygen   [] Productive cough   [] Hemoptysis   [] Wheeze  [] COPD    [] Asthma Neurologic:  [] Dizziness  [] Blackouts   [] Seizures   [] History of stroke   [] History of TIA  [] Aphasia   [] Temporary blindness   [] Dysphagia   [] Weakness or numbness in arms   [] Weakness or numbness in legs Musculoskeletal:  [] Arthritis   [] Joint swelling   [] Joint pain   [] Low back pain Hematologic:  [] Easy bruising  [] Easy bleeding   [] Hypercoagulable state   [] Anemic  [] Hepatitis Gastrointestinal:  [] Blood in stool   [] Vomiting blood  [] Gastroesophageal reflux/heartburn   [] Abdominal pain Genitourinary:  [] Chronic kidney disease   [] Difficult urination  [] Frequent urination  [] Burning with urination   [] Hematuria Skin:  [] Rashes   [] Ulcers   [] Wounds Psychological:  [] History of anxiety   []  History of major depression.    Physical Exam BP (!) 143/78 (BP Location: Right Arm)   Pulse (!) 50   Resp 16   Ht 5\' 9"  (1.753 m)   Wt 94.8 kg (209 lb)   BMI 30.86 kg/m  Gen:  WD/WN, NAD. Appears younger than stated age Head: [place]/AT, No temporalis wasting.  Ear/Nose/Throat: Hearing grossly intact, nares w/o erythema or drainage, oropharynx w/o Erythema/Exudate Eyes: Conjunctiva clear, sclera non-icteric  Neck: trachea midline.  No bruit or JVD.  Pulmonary:  Good air movement, clear to auscultation bilaterally.  Cardiac: RRR, normal S1, S2, no Murmurs, rubs or gallops. Vascular:  Vessel Right Left  Radial Palpable Palpable  Ulnar Palpable Palpable  Brachial Palpable Palpable  Carotid Palpable, without bruit Palpable, without bruit  Aorta Not palpable N/A  Femoral Palpable Palpable  Popliteal Palpable Palpable  PT Palpable Palpable  DP Palpable Palpable   Gastrointestinal: soft, non-tender/non-distended.  Musculoskeletal: M/S 5/5 throughout.  Extremities without ischemic changes.  No deformity or atrophy.  Neurologic: Sensation grossly intact in extremities.  Symmetrical.  Speech is fluent. Motor exam as listed above. Psychiatric: Judgment intact, Mood & affect appropriate for  pt's clinical situation. Dermatologic: No rashes or ulcers noted.  No cellulitis or open wounds.    Radiology No results found.  Labs No results found for this or any previous visit (from the past 2160 hour(s)).  Assessment/Plan:  HLD (hyperlipidemia) Although a statin was listed in his medicines, he says he is not taking them as he has tried and is intolerant. He understands that cholesterol management is important to reducing progression of disease.  BP (high blood pressure) blood pressure control important in reducing the progression of atherosclerotic disease. On appropriate oral medications.   Carotid stenosis A carotid duplex demonstrated mild plaque in the right carotid artery with  velocities showing no greater than 50% stenosis. His left carotid artery demonstrated moderate plaque with velocities that were read in the 50-70% range by duplex criteria. We had a long discussion today regarding pathophysiology and natural history of carotid artery stenosis. Given these degrees of stenosis, surgery would not yet be indicated. Aspirin and a statin agent would be the treatment regimens of choice with monitoring this in 6 months with duplex. He is having issues with statins and may not be able to take these.      Leotis Pain 05/05/2017, 9:51 AM   This note was created with Dragon medical transcription system.  Any errors from dictation are unintentional.

## 2017-05-05 NOTE — Assessment & Plan Note (Signed)
blood pressure control important in reducing the progression of atherosclerotic disease. On appropriate oral medications.  

## 2017-05-05 NOTE — Patient Instructions (Signed)
Carotid Artery Disease The carotid arteries are the two main arteries on either side of the neck that supply blood to the brain. Carotid artery disease, also called carotid artery stenosis, is the narrowing or blockage of one or both carotid arteries. Carotid artery disease increases your risk for a stroke or a transient ischemic attack (TIA). A TIA is an episode in which a waxy, fatty substance that accumulates within the artery (plaque) blocks blood flow to the brain. A TIA is considered a "warning stroke." What are the causes?  Buildup of plaque inside the carotid arteries (atherosclerosis) (common).  A weakened outpouching in an artery (aneurysm).  Inflammation of the carotid artery (arteritis).  A fibrous growth within the carotid artery (fibromuscular dysplasia).  Tissue death within the carotid artery due to radiation treatment (post-radiation necrosis).  Decreased blood flow due to spasms of the carotid artery (vasospasm).  Separation of the walls of the carotid artery (carotid dissection). What increases the risk?  High cholesterol (dyslipidemia).  High blood pressure (hypertension).  Smoking.  Obesity.  Diabetes.  Family history of cardiovascular disease.  Inactivity or lack of regular exercise.  Being male. Men have an increased risk of developing atherosclerosis earlier in life than women. What are the signs or symptoms? Carotid artery disease does not cause symptoms. How is this diagnosed? Diagnosis of carotid artery disease may include:  A physical exam. Your health care provider may hear an abnormal sound (bruit) when listening to the carotid arteries.  Specific tests that look at the blood flow in the carotid arteries. These tests include: ? Carotid artery ultrasonography. ? Carotid or cerebral angiography. ? Computerized tomographic angiography (CTA). ? Magnetic resonance angiography (MRA).  How is this treated? Treatment of carotid artery disease can  include a combination of treatments. Treatment options include:  Surgery. You may have: ? A carotid endarterectomy. This is a surgery to remove the blockages in the carotid arteries. ? A carotid angioplasty with stenting. This is a nonsurgical interventional procedure. A wire mesh (stent) is used to widen the blocked carotid arteries.  Medicines to control blood pressure, cholesterol, and reduce blood clotting (antiplatelet therapy).  Adjusting your diet.  Lifestyle changes such as: ? Quitting smoking. ? Exercising as tolerated or as directed by your health care provider. ? Controlling and maintaining a good blood pressure. ? Keeping cholesterol levels under control.  Follow these instructions at home:  Take medicines only as directed by your health care provider. Make sure you understand all your medicine instructions. Do not stop your medicines without talking to your health care provider.  Follow your health care provider's diet instructions. It is important to eat a healthy diet that is low in saturated fats and includes plenty of fresh fruits, vegetables, and lean meats. High-fat, high-sodium foods as well as foods that are fried, overly processed, or have poor nutritional value should be avoided.  Maintain a healthy weight.  Stay physically active. It is recommended that you get at least 30 minutes of activity every day.  Do not use any tobacco products including cigarettes, chewing tobacco, or electronic cigarettes. If you need help quitting, ask your health care provider.  Limit alcohol use to: ? No more than 2 drinks per day for men. ? No more than 1 drink per day for nonpregnant women.  Do not use illegal drugs.  Keep all follow-up visits as directed by your health care provider. Get help right away if: You develop TIA or stroke symptoms. These include:  Sudden   weakness or numbness on one side of the body, such as in the face, arm, or leg.  Sudden  confusion.  Trouble speaking (aphasia) or understanding.  Sudden trouble seeing out of one or both eyes.  Sudden trouble walking.  Dizziness or feeling like you might faint.  Loss of balance or coordination.  Sudden severe headache with no known cause.  Sudden trouble swallowing (dysphagia).  If you have any of these symptoms, call your local emergency services (911 in [place] Do not drive yourself to the clinic or hospital. This is a medical emergency. This information is not intended to replace advice given to you by your health care provider. Make sure you discuss any questions you have with your health care provider. Document Released: 02/09/2012 Document Revised: 04/24/2016 Document Reviewed: 05/18/2013 Elsevier Interactive Patient Education  2017 [employer]  

## 2017-05-05 NOTE — Assessment & Plan Note (Signed)
A carotid duplex demonstrated mild plaque in the right carotid artery with velocities showing no greater than 50% stenosis. His left carotid artery demonstrated moderate plaque with velocities that were read in the 50-70% range by duplex criteria. We had a long discussion today regarding pathophysiology and natural history of carotid artery stenosis. Given these degrees of stenosis, surgery would not yet be indicated. Aspirin and a statin agent would be the treatment regimens of choice with monitoring this in 6 months with duplex. He is having issues with statins and may not be able to take these.

## 2017-05-20 DIAGNOSIS — R002 Palpitations: Secondary | ICD-10-CM | POA: Insufficient documentation

## 2017-06-08 ENCOUNTER — Other Ambulatory Visit: Payer: Self-pay | Admitting: Orthopedic Surgery

## 2017-06-08 DIAGNOSIS — M7989 Other specified soft tissue disorders: Secondary | ICD-10-CM

## 2017-06-08 DIAGNOSIS — M79604 Pain in right leg: Secondary | ICD-10-CM

## 2017-06-11 ENCOUNTER — Ambulatory Visit
Admission: RE | Admit: 2017-06-11 | Discharge: 2017-06-11 | Disposition: A | Discharge status: Home / Self Care | Payer: Medicare Other | Source: Ambulatory Visit | Attending: Orthopedic Surgery | Admitting: Orthopedic Surgery

## 2017-06-11 DIAGNOSIS — M7989 Other specified soft tissue disorders: Secondary | ICD-10-CM | POA: Diagnosis not present

## 2017-06-11 DIAGNOSIS — S86111A Strain of other muscle(s) and tendon(s) of posterior muscle group at lower leg level, right leg, initial encounter: Secondary | ICD-10-CM | POA: Diagnosis not present

## 2017-06-11 DIAGNOSIS — M79604 Pain in right leg: Secondary | ICD-10-CM | POA: Diagnosis present

## 2017-06-11 DIAGNOSIS — X58XXXA Exposure to other specified factors, initial encounter: Secondary | ICD-10-CM | POA: Diagnosis not present

## 2017-06-11 DIAGNOSIS — M62561 Muscle wasting and atrophy, not elsewhere classified, right lower leg: Secondary | ICD-10-CM | POA: Diagnosis not present

## 2017-08-11 ENCOUNTER — Encounter: Payer: Self-pay | Admitting: Neurology

## 2017-08-11 ENCOUNTER — Ambulatory Visit (INDEPENDENT_AMBULATORY_CARE_PROVIDER_SITE_OTHER): Payer: Medicare Other | Admitting: Neurology

## 2017-08-11 VITALS — BP 148/77 | HR 58 | Ht 69.0 in | Wt 213.0 lb

## 2017-08-11 DIAGNOSIS — M542 Cervicalgia: Secondary | ICD-10-CM

## 2017-08-11 DIAGNOSIS — I739 Peripheral vascular disease, unspecified: Secondary | ICD-10-CM

## 2017-08-11 DIAGNOSIS — G8929 Other chronic pain: Secondary | ICD-10-CM

## 2017-08-11 DIAGNOSIS — M5441 Lumbago with sciatica, right side: Secondary | ICD-10-CM | POA: Diagnosis not present

## 2017-08-11 MED ORDER — GABAPENTIN 300 MG PO CAPS: 300.0000 mg | ORAL_CAPSULE | Freq: Three times a day (TID) | ORAL | 11 refills | Status: DC

## 2017-08-11 NOTE — Progress Notes (Signed)
PATIENT: Willie Burton DOB: 1946/03/05  Chief Complaint  Patient presents with  . Myopathy    He is here with his wife, Willie Burton.  Reports achy pain/weakness in his upper and lower extremities.  His worse pain is in his right calf made much worse with walking.  Marland Kitchen PCP    Willie Burton, Willie Douglas, MD     [place] this 71 year old male, seen in refer by his primary care doctor Willie Burton for evaluation of weakness in upper and lower extremity, worse in his right calf, initial evaluation is on August 11 2017.  I reviewed and summarized referring note, he had a history of hypertension, peripheral vascular disease, hyperlipidemia, coronary artery disease, status post CABG, prostate cancer, previous history of alcohol abuse, recovered for more than 20 years. He is a retired Chief Financial Officer.  He had a history of chronic low back pain, most recent flareup was in 2015, he described right-sided low back pain, radiating pain to right hip, we were able to personally review MRI of lumbar spine in 2015, multilevel degenerative disc disease, at L3-4, there is 29mm rounded low signal structure posterior to L3 vertebral bodies worse than left, concerning for disc fragment, moderate left foraminal narrowing,  His low back pain has improved with exercise, low back stretching exercise, but since January 2018, he noticed mild recurrent midline low back pain, the most disability taking symptoms is he has intermittent right calf area stretching achy pain after walking one block, he has to stop for a few minutes, the pain will go away, he can continue, but quickly recurred again, this has put significant limitation on his daily activity,  He also complains of chronic neck pain, no bilateral upper extremity radiating pain, no bowel and bladder incontinence.  I have personally reviewed MRI of right lower extremity on July twelfth 2018, there is evidence of left calf muscle fatty atrophy, involving  soleus, lateral gastrocnemius, peroneal muscles, suspected a denervation process,   The major neurovascular bundles appear grossly normal in right lower extremity   Right lower extremity venous Doppler study was normal in June 2017, he presented with right thigh pain for 3 months then.  Laboratory evaluations in June 2018, normal CMP, creatinine 0.9, CBC hemoglobin of 15.2, cholesterol was mildly elevated to 31, LDL was 144,  REVIEW OF SYSTEMS: Full 14 system review of systems performed and notable only for achy muscles  ALLERGIES: Allergies  Allergen Reactions  . Hepatitis A Antigen   . Typhoid Vaccines   . Amoxicillin Rash    HOME MEDICATIONS: Current Outpatient Prescriptions  Medication Sig Dispense Refill  . aspirin EC 81 MG tablet Take by mouth daily.     . magnesium oxide (MAG-OX) 400 MG tablet Take by mouth.    . metoprolol tartrate (LOPRESSOR) 25 MG tablet Take by mouth.    . rosuvastatin (CRESTOR) 5 MG tablet Take by mouth.    . vitamin B-12 (CYANOCOBALAMIN) 1000 MCG tablet Take 1,000 mcg by mouth daily. Reported on 12/31/2015     No current facility-administered medications for this visit.     PAST MEDICAL HISTORY: Past Medical History:  Diagnosis Date  . 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 Arthritis   . Diverticula of colon   . Heart disease   . High blood pressure   . High cholesterol   . Hyperlipemia   . Prostate cancer ([place])     PAST SURGICAL HISTORY: Past Surgical History:  Procedure Laterality Date  . APPENDECTOMY    . CARDIAC CATHETERIZATION N/A 11/13/2015   Procedure: Left Heart Cath and Coronary Angiography;  Surgeon: Teodoro Spray, MD;  Location: [facility];  Service: Cardiovascular;  Laterality: N/A;  . CATARACT EXTRACTION    . COLON SURGERY    . CORONARY ARTERY BYPASS GRAFT     x 3  . LIPOMA EXCISION Right    thigh  . METACARPOPHALANGEAL JOINT ARTHROPLASTY    . ULNAR NERVE TRANSPOSITION Left January 2015    FAMILY  HISTORY: Family History  Problem Relation Age of Onset  . COPD Mother        Deceased, 41  . Throat cancer Father        Deceased, 84s  . Healthy Brother   . Healthy Daughter   . Healthy Son     SOCIAL HISTORY:  Social History   Social History  . Marital status: Married    Spouse name: N/A  . Number of children: 2  . Years of education: Masters   Occupational History  . retired    Social History Main Topics  . Smoking status: Former Smoker    Packs/day: 1.50    Years: 5.00    Types: Cigarettes  . Smokeless tobacco: Never Used     Comment: Quit 40+ years ago  . Alcohol use No  . Drug use: No  . Sexual activity: Not on file   Other Topics Concern  . Not on file   Social History Narrative   He lives with his wife.   Retired Chief Financial Officer.   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   1-2 cups caffeine per day.     PHYSICAL EXAM   Vitals:   08/11/17 0944  BP: (!) 148/77  Pulse: (!) 58  Weight: 213 lb (96.6 kg)  Height: 5\' 9"  (1.753 m)    Not recorded      Body mass index is 31.45 kg/m.  PHYSICAL EXAMNIATION:  Gen: NAD, conversant, well nourised, obese, well groomed                     Cardiovascular: Regular rate rhythm, no peripheral edema, warm, nontender. Eyes: Conjunctivae clear without exudates or hemorrhage Neck: Supple, no carotid bruits. Pulmonary: Clear to auscultation bilaterally   NEUROLOGICAL EXAM:  MENTAL STATUS: Speech:    Speech is normal; fluent and spontaneous with normal comprehension.  Cognition:     Orientation to time, place and person     Normal recent and remote memory     Normal Attention span and concentration     Normal Language, naming, repeating,spontaneous speech     Fund of knowledge   CRANIAL NERVES: CN II: Visual fields are full to confrontation. Fundoscopic exam is normal with sharp discs and no vascular changes. Pupils are round equal and briskly reactive to light. CN III, IV, VI:  extraocular movement are normal. No ptosis. CN V: Facial sensation is intact to pinprick in all 3 divisions bilaterally. Corneal responses are intact.  CN VII: Face is symmetric with normal eye closure and smile. CN VIII: Hearing is normal to rubbing fingers CN IX, X: Palate elevates symmetrically. Phonation is normal. CN XI: Head turning and shoulder shrug are intact CN XII: Tongue is midline with normal movements and no atrophy.  MOTOR: There is no pronator drift of out-stretched arms. Muscle bulk and tone are normal. Muscle strength is normal.  REFLEXES: Reflexes are  2+ and symmetric at the biceps, triceps, knees, and ankles. Plantar responses are flexor.  SENSORY: Intact to light touch, pinprick, positional sensation and vibratory sensation are intact in fingers and toes.  COORDINATION: Rapid alternating movements and fine finger movements are intact. There is no dysmetria on finger-to-nose and heel-knee-shin.    GAIT/STANCE: Posture is normal. Gait is steady with normal steps, base, arm swing, and turning. Heel and toe walking are normal. Tandem gait is normal.  Romberg is absent.   DIAGNOSTIC DATA (LABS, IMAGING, TESTING) - I reviewed patient records, labs, notes, testing and imaging myself where available.   ASSESSMENT AND PLAN  DANNE VASEK is a 71 y.o. male   History of chronic low back pain, now with right calf area deep achy pain with exertion,  Potential right lower extremity neurogenic claudication  Proceed with MRI of lumbar spine, EMG nerve conduction study  Laboratory evaluation to rule out inflammatory process  Chronic neck pain   MRI of cervical spine, to rule out cervical spondylitic myelopathy, he does has brisk reflexes  Marcial Pacas, M.D. Ph.D.  [facility] [address], [place], [place] [address] Ph: [phone] Fax: [phone]  CC: Willie Crouch, MD

## 2017-08-12 LAB — TSH: TSH: 1.34 u[IU]/mL (ref 0.450–4.500)

## 2017-08-12 LAB — CK: Total CK: 266 U/L — ABNORMAL HIGH (ref 24–204)

## 2017-08-12 LAB — C-REACTIVE PROTEIN: CRP: 0.4 mg/L (ref 0.0–4.9)

## 2017-08-12 LAB — VITAMIN D 25 HYDROXY (VIT D DEFICIENCY, FRACTURES): Vit D, 25-Hydroxy: 25.2 ng/mL — ABNORMAL LOW (ref 30.0–100.0)

## 2017-08-12 LAB — ANA W/REFLEX: Anti Nuclear Antibody(ANA): NEGATIVE

## 2017-08-12 LAB — SEDIMENTATION RATE: SED RATE: 2 mm/h (ref 0–30)

## 2017-08-14 ENCOUNTER — Ambulatory Visit
Admission: RE | Admit: 2017-08-14 | Discharge: 2017-08-14 | Disposition: A | Discharge status: Home / Self Care | Payer: Medicare Other | Source: Ambulatory Visit | Attending: Neurology | Admitting: Neurology

## 2017-08-14 DIAGNOSIS — M5441 Lumbago with sciatica, right side: Secondary | ICD-10-CM

## 2017-08-14 DIAGNOSIS — I739 Peripheral vascular disease, unspecified: Secondary | ICD-10-CM

## 2017-08-14 DIAGNOSIS — G8929 Other chronic pain: Secondary | ICD-10-CM

## 2017-08-14 DIAGNOSIS — M542 Cervicalgia: Secondary | ICD-10-CM

## 2017-09-04 ENCOUNTER — Ambulatory Visit (INDEPENDENT_AMBULATORY_CARE_PROVIDER_SITE_OTHER): Payer: Medicare Other | Admitting: Neurology

## 2017-09-04 DIAGNOSIS — R252 Cramp and spasm: Secondary | ICD-10-CM | POA: Insufficient documentation

## 2017-09-04 DIAGNOSIS — I739 Peripheral vascular disease, unspecified: Secondary | ICD-10-CM

## 2017-09-04 DIAGNOSIS — G8929 Other chronic pain: Secondary | ICD-10-CM | POA: Diagnosis not present

## 2017-09-04 DIAGNOSIS — M5441 Lumbago with sciatica, right side: Secondary | ICD-10-CM

## 2017-09-04 DIAGNOSIS — I6523 Occlusion and stenosis of bilateral carotid arteries: Secondary | ICD-10-CM

## 2017-09-04 DIAGNOSIS — M542 Cervicalgia: Secondary | ICD-10-CM

## 2017-09-04 NOTE — Patient Instructions (Signed)
Directions to [facility]: [address] [place], [place] [address]  Telephone: [place] [phone] Fax [phone]

## 2017-09-04 NOTE — Procedures (Signed)
Full Name: Willie Burton Gender: Male MRN #: [id number] Date of Birth: [birth year]    Visit Date: 09/04/17 09:18 Age: 71 Years 74 Months Old Examining Physician: Marcial Pacas, MD  Referring Physician: Krista Blue, MD History: 71 year old male, with few months history of right calf muscle spasm with prolonged walking   Summary of the test:  Nerve conduction study: Bilateral lower extremity sensory and motor nerve conduction studies were normal.  Electromyography: Selected needle examination of bilateral lower extremity muscle and lumbosacral paraspinals were performed.  There is evidence of slight neuropathic changes involving left gastrocnemius lateralis.   Conclusion: This is a slight abnormal study. 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 There is no evidence of active process.   ------------------------------- Marcial Pacas, M.D.  [facility] [place], [place] [address] Tel: [phone] Fax: [phone]        [facility]    Nerve / Sites Muscle Latency Ref. Amplitude Ref. Rel Amp Segments Distance Velocity Ref. Area    ms ms mV mV %  cm m/s m/s mVms  R Peroneal - EDB     Ankle EDB 6.5 ?6.5 3.0 ?2.0 100 Ankle - EDB 9   9.5     Fib head EDB 13.3  3.0  98.4 Fib head - Ankle 30 44 ?44 10.0     Pop fossa EDB 16.0  2.9  97.3 Pop fossa - Fib head 12 44 ?44 9.8         Pop fossa - Ankle      L Peroneal - EDB     Ankle EDB 5.9 ?6.5 3.2 ?2.0 100 Ankle - EDB 9   10.4     Fib head EDB 12.5  2.7  84.8 Fib head - Ankle 30 46 ?44 10.9     Pop fossa EDB 15.2  2.6  94.4 Pop fossa - Fib head 12 45 ?44 10.6         Pop fossa - Ankle      R Tibial - AH     Ankle AH 5.7 ?5.8 11.0 ?4.0 100 Ankle - AH 9   24.6     Pop fossa AH 14.8  9.6  87.1 Pop fossa - Ankle 37 41 ?41 24.6  L Tibial - AH     Ankle AH 5.4 ?5.8 9.4 ?4.0 100 Ankle - AH 9   29.8     Pop fossa AH  14.3  6.8  72.4 Pop fossa - Ankle 37 41 ?41 22.2             SNC    Nerve / Sites Rec. Site Peak Lat Ref.  Amp Ref. Segments Distance    ms ms V V  cm  R Sural - Ankle (Calf)     Calf Ankle 4.2 ?4.4 7 ?6 Calf - Ankle 14  L Sural - Ankle (Calf)     Calf Ankle 4.1 ?4.4 4 ?6 Calf - Ankle 14  R Superficial peroneal - Ankle     Lat leg Ankle 4.3 ?4.4 6 ?6 Lat leg - Ankle 14  L Superficial peroneal - Ankle     Lat leg Ankle 4.2 ?4.4 5 ?6 Lat leg - Ankle 14              F  Wave    Nerve F Lat Ref.   ms ms  R Tibial - AH  53.4 ?56.0  L Tibial - AH 54.9 ?56.0         H Reflex    Nerve H Lat Lat Hmax   ms ms   Left Right Ref. Left Right Ref.  Tibial - [place] 37.1 35.7 ?35.0 33.4 32.6 ?35.0         EMG full       EMG       EMG Summary Table    Spontaneous MUAP Recruitment  Muscle IA Fib PSW Fasc Other Amp Dur. Poly Pattern  R. Tibialis anterior Normal None None None _______ Normal Normal Normal Normal  R. Tibialis posterior Normal None None None _______ Normal Normal Normal Normal  R. Peroneus longus Normal None None None _______ Normal Normal Normal Normal  R. Vastus lateralis Normal None None None _______ Normal Normal Normal Normal  L. Tibialis anterior Normal None None None _______ Normal Normal Normal Normal  L. Tibialis posterior Normal None None None _______ Normal Normal Normal Normal  L. Peroneus longus Normal None None None _______ Normal Normal Normal Normal  L. Vastus lateralis Normal None None None _______ Normal Normal Normal Normal  L. Gastrocnemius (Lateral head) Increased None None None _______ Normal Normal Normal Reduced  L. Lumbar paraspinals (mid) Normal None None None _______ Normal Normal Normal Normal  L. Lumbar paraspinals (low) Normal None None None _______ Normal Normal Normal Normal  R. Lumbar paraspinals (mid) Normal None None None _______ Normal Normal Normal Normal  R. Lumbar paraspinals (low) Normal None None None _______ Normal Normal Normal  Normal

## 2017-09-04 NOTE — Progress Notes (Signed)
PATIENT: Willie Burton DOB: Nov 09, 1946  No chief complaint on file.    HISTORICAL  Willie Burton this 71 year old male, seen in refer by his primary care doctor Idelle Crouch for evaluation of weakness in upper and lower extremity, worse in his right calf, initial evaluation is on August 11 2017.  I reviewed and summarized referring note, he had a history of hypertension, peripheral vascular disease, hyperlipidemia, coronary artery disease, status post CABG, prostate cancer, previous history of alcohol abuse, recovered for more than 20 years. He is a retired Chief Financial Officer.  He had a history of chronic low back pain, most recent flareup was in 2015, he described right-sided low back pain, radiating pain to right hip, we were able to personally review MRI of lumbar spine in 2015, multilevel degenerative disc disease, at L3-4, there is 75mm rounded low signal structure posterior to L3 vertebral bodies worse than left, concerning for disc fragment, moderate left foraminal narrowing,  His low back pain has improved with exercise, low back stretching exercise, but since January 2018, he noticed mild recurrent midline low back pain, the most disability taking symptoms is he has intermittent right calf area stretching achy pain after walking one block, he has to stop for a few minutes, the pain will go away, he can continue, but quickly recurred again, this has put significant limitation on his daily activity,  He also complains of chronic neck pain, no bilateral upper extremity radiating pain, no bowel and bladder incontinence.  I have personally reviewed MRI of right lower extremity on July twelfth 2018, there is evidence of left calf muscle fatty atrophy, involving soleus, lateral gastrocnemius, peroneal muscles, suspected a denervation process,   The major neurovascular bundles appear grossly normal in right lower extremity   Right lower extremity venous Doppler study was normal in June  2017, he presented with right thigh pain for 3 months then.  Laboratory evaluations in June 2018, normal CMP, creatinine 0.9, CBC hemoglobin of 15.2, cholesterol was mildly elevated to 31, LDL was 144,  UPDATE Sep 04 2017: He continue complains of right calf muscle occasionally right thigh muscle cramping with prolonged walking, today he also reported history of left ulnar neuropathy, status post transposition of surgery, in the past few months, he also noticed left abductor pollicis brevis muscle atrophy, mild weakness,  We have personally reviewed MRI of lumbar spine, multilevel degenerative changes, at L4-5, there is disc herniation towards the left, with moderate severe left foraminal narrowing.  MRI of cervical spine multilevel degenerative changes, with mild stenosis at C4-5, C5-6 level, variable degree of foraminal narrowing, spinal cord appears normal   patient reported that he had recent arterial Doppler study of right lower extremity at [facility] clinic, there was no significant abnormality noted.  REVIEW OF SYSTEMS: Full 14 system review of systems performed and notable only for achy muscles  ALLERGIES: Allergies  Allergen Reactions  . Hepatitis A Antigen   . Typhoid Vaccines   . Amoxicillin Rash    HOME MEDICATIONS: Current Outpatient Prescriptions  Medication Sig Dispense Refill  . aspirin EC 81 MG tablet Take by mouth daily.     Marland Kitchen gabapentin (NEURONTIN) 300 MG capsule Take 1 capsule (300 mg total) by mouth 3 (three) times daily. 90 capsule 11  . magnesium oxide (MAG-OX) 400 MG tablet Take by mouth.    . metoprolol tartrate (LOPRESSOR) 25 MG tablet Take by mouth.    . rosuvastatin (CRESTOR) 5 MG tablet Take by mouth.    Marland Kitchen  vitamin B-12 (CYANOCOBALAMIN) 1000 MCG tablet Take 1,000 mcg by mouth daily. Reported on 12/31/2015     No current facility-administered medications for this visit.     PAST MEDICAL HISTORY: Past Medical History:  Diagnosis Date  . 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 Arthritis   . Diverticula of colon   . Heart disease   . High blood pressure   . High cholesterol   . Hyperlipemia   . Prostate cancer ([place])     PAST SURGICAL HISTORY: Past Surgical History:  Procedure Laterality Date  . APPENDECTOMY    . CARDIAC CATHETERIZATION N/A 11/13/2015   Procedure: Left Heart Cath and Coronary Angiography;  Surgeon: Teodoro Spray, MD;  Location: [facility];  Service: Cardiovascular;  Laterality: N/A;  . CATARACT EXTRACTION    . COLON SURGERY    . CORONARY ARTERY BYPASS GRAFT     x 3  . LIPOMA EXCISION Right    thigh  . METACARPOPHALANGEAL JOINT ARTHROPLASTY    . ULNAR NERVE TRANSPOSITION Left January 2015    FAMILY HISTORY: Family History  Problem Relation Age of Onset  . COPD Mother        Deceased, 9  . Throat cancer Father        Deceased, 86s  . Healthy Brother   . Healthy Daughter   . Healthy Son     SOCIAL HISTORY:  Social History   Social History  . Marital status: Married    Spouse name: N/A  . Number of children: 2  . Years of education: Masters   Occupational History  . retired    Social History Main Topics  . Smoking status: Former Smoker    Packs/day: 1.50    Years: 5.00    Types: Cigarettes  . Smokeless tobacco: Never Used     Comment: Quit 40+ years ago  . Alcohol use No  . Drug use: No  . Sexual activity: Not on file   Other Topics Concern  . Not on file   Social History Narrative   He lives with his wife.   Retired Chief Financial Officer.   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   1-2 cups caffeine per day.     PHYSICAL EXAM   There were no vitals filed for this visit.  Not recorded      There is no height or weight on file to calculate BMI.  PHYSICAL EXAMNIATION:  Gen: NAD, conversant, well nourised, obese, well groomed                     Cardiovascular: Regular rate rhythm, no peripheral edema, warm, nontender. Eyes: Conjunctivae clear  without exudates or hemorrhage Neck: Supple, no carotid bruits. Pulmonary: Clear to auscultation bilaterally   NEUROLOGICAL EXAM:  MENTAL STATUS: Speech:    Speech is normal; fluent and spontaneous with normal comprehension.  Cognition:     Orientation to time, place and person     Normal recent and remote memory     Normal Attention span and concentration     Normal Language, naming, repeating,spontaneous speech     Fund of knowledge   CRANIAL NERVES: CN II: Visual fields are full to confrontation. Fundoscopic exam is normal with sharp discs and no vascular changes. Pupils are round equal and briskly reactive to light. CN III, IV, VI: extraocular movement are normal. No ptosis. CN V: Facial sensation is intact to pinprick in  all 3 divisions bilaterally. Corneal responses are intact.  CN VII: Face is symmetric with normal eye closure and smile. CN VIII: Hearing is normal to rubbing fingers CN IX, X: Palate elevates symmetrically. Phonation is normal. CN XI: Head turning and shoulder shrug are intact CN XII: Tongue is midline with normal movements and no atrophy.  MOTOR: He has mild left hand intrinsic muscle atrophy, abductor pollicis brevis atrophy, mild weak grip, mild finger abduction and abductor pollicis brevis weakness    REFLEXES: Reflexes are 2+ and symmetric at the biceps, triceps, knees, and ankles. Plantar responses are flexor.  SENSORY: Intact to light touch, pinprick, positional sensation and vibratory sensation are intact in fingers and toes.  COORDINATION: Rapid alternating movements and fine finger movements are intact. There is no dysmetria on finger-to-nose and heel-knee-shin.    GAIT/STANCE: Posture is normal. Gait is steady with normal steps, base, arm swing, and turning. Heel and toe walking are normal. Tandem gait is normal.  Romberg is absent.   DIAGNOSTIC DATA (LABS, IMAGING, TESTING) - I reviewed patient records, labs, notes, testing and imaging  myself where available.   ASSESSMENT AND PLAN  Willie Burton is a 71 y.o. 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  Refer him to vascular surgeon  Return in 3 months  Marcial Pacas, M.D. Ph.D.  [facility] [address] [place], [place] [address] Ph: [phone] Fax: [phone]  CC: Idelle Crouch, MD

## 2017-10-07 ENCOUNTER — Encounter: Payer: Self-pay | Admitting: Vascular Surgery

## 2017-10-07 ENCOUNTER — Other Ambulatory Visit: Payer: Self-pay

## 2017-10-07 ENCOUNTER — Ambulatory Visit (INDEPENDENT_AMBULATORY_CARE_PROVIDER_SITE_OTHER): Payer: Medicare Other | Admitting: Vascular Surgery

## 2017-10-07 ENCOUNTER — Encounter: Payer: Self-pay | Admitting: *Deleted

## 2017-10-07 VITALS — BP 133/75 | HR 63 | Temp 97.0°F | Resp 18 | Ht 69.0 in | Wt 212.0 lb

## 2017-10-07 DIAGNOSIS — I6523 Occlusion and stenosis of bilateral carotid arteries: Secondary | ICD-10-CM | POA: Diagnosis not present

## 2017-10-07 DIAGNOSIS — M79604 Pain in right leg: Secondary | ICD-10-CM

## 2017-10-07 NOTE — H&P (View-Only) (Signed)
Referring Physician: Dr [name]  Patient name: Willie Burton MRN: [id number] DOB: 17-Oct-1946 Sex: male  REASON FOR CONSULT: Right calf pain with ambulation  HPI: Willie Burton is a 71 y.o. male with a 9 month history of pain in the right calf with walking. He states that prior to 9 months ago he could walk as much as he wished without any problems. Now at about one quarter of a mile he has tight pain in his right calf which radiates to the popliteal area which is relieved by several minutes of rest. The walking distance is very consistent. He denies rest pain. He has no nonhealing wounds. Other medical problems include coronary artery disease with prior vein harvest from both legs, hypertension, elevated cholesterol all of which are stable. He has no symptoms in the left leg. 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 He states this was benign. He is a former smoker but quit about 40 years ago.  He does have a history of carotid occlusive disease and has seen Dr. Cristal Generous in the past. Right carotid that time was 50% left was 50-70% this was in June 2018. He denies any family history of abdominal aortic aneurysm. He is on aspirin and statin. He had a fairly extensive neurologic workup including an MRI of the leg MRI of the cervical and lumbar spine all of which were really unremarkable.  He also had an EMG which was unremarkable.  Past Medical History:  Diagnosis Date  . 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 Arthritis   . Diverticula of colon   . Heart disease   . High blood pressure   . High cholesterol   . Hyperlipemia   . Prostate cancer [facility])    Past Surgical History:  Procedure Laterality Date  . APPENDECTOMY    . CATARACT EXTRACTION    . COLON SURGERY    . CORONARY ARTERY BYPASS GRAFT     x 3  . LIPOMA EXCISION Right    thigh  . METACARPOPHALANGEAL JOINT ARTHROPLASTY    . ULNAR NERVE TRANSPOSITION Left January  2015    Family History  Problem Relation Age of Onset  . COPD Mother        Deceased, 41  . Throat cancer Father        Deceased, 69s  . Healthy Brother   . Healthy Daughter   . Healthy Son     SOCIAL HISTORY: Social History   Socioeconomic History  . Marital status: Married    Spouse name: Not on file  . Number of children: 2  . Years of education: Masters  . Highest education level: Not on file  Social Needs  . Financial resource strain: Not on file  . Food insecurity - worry: Not on file  . Food insecurity - inability: Not on file  . Transportation needs - medical: Not on file  . Transportation needs - non-medical: Not on file  Occupational History  . Occupation: retired  Tobacco Use  . Smoking status: Former Smoker    Packs/day: 1.50    Years: 5.00    Pack years: 7.50    Types: Cigarettes  . Smokeless tobacco: Never Used  . Tobacco comment: Quit 40+ years ago  Substance and Sexual Activity  . Alcohol use: No    Alcohol/week: 0.0 oz  . Drug use: No  . Sexual activity: Not on file  Other Topics Concern  . Not on file  Social History Narrative   He lives with his wife.   Retired Chief Financial Officer.   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   1-2 cups caffeine per day.    Allergies  Allergen Reactions  . Hepatitis A Antigen   . Typhoid Vaccines   . Amoxicillin Rash    Current Outpatient Medications  Medication Sig Dispense Refill  . aspirin EC 81 MG tablet Take by mouth daily.     . magnesium oxide (MAG-OX) 400 MG tablet Take by mouth.    . metoprolol tartrate (LOPRESSOR) 25 MG tablet Take by mouth.    . rosuvastatin (CRESTOR) 5 MG tablet Take by mouth.    . vitamin B-12 (CYANOCOBALAMIN) 1000 MCG tablet Take 1,000 mcg by mouth daily. Reported on 12/31/2015    . gabapentin (NEURONTIN) 300 MG capsule Take 1 capsule (300 mg total) by mouth 3 (three) times daily. (Patient not taking: Reported on 10/07/2017) 90 capsule 11   No current  facility-administered medications for this visit.     ROS:   General:  No weight loss, Fever, chills  HEENT: No recent headaches, no nasal bleeding, no visual changes, no sore throat  Neurologic: No dizziness, blackouts, seizures. No recent symptoms of stroke or mini- stroke. No recent episodes of slurred speech, or temporary blindness.  Cardiac: No recent episodes of chest pain/pressure, no shortness of breath at rest.  No shortness of breath with exertion.  Denies history of atrial fibrillation or irregular heartbeat  Vascular: No history of rest pain in feet.  No history of claudication.  No history of non-healing ulcer, No history of DVT   Pulmonary: No home oxygen, no productive cough, no hemoptysis,  No asthma or wheezing  Musculoskeletal:  [X]  Arthritis, [X]  Low back pain,  [X]  Joint pain  Hematologic:No history of hypercoagulable state.  No history of easy bleeding.  No history of anemia  Gastrointestinal: No hematochezia or melena,  No gastroesophageal reflux, no trouble swallowing  Urinary: [ ]  chronic Kidney disease, [ ]  on HD - [ ]  MWF or [ ]  TTHS, [ ]  Burning with urination, [ ]  Frequent urination, [ ]  Difficulty urinating;   Skin: No rashes  Psychological: No history of anxiety,  No history of depression   Physical Examination  Vitals:   10/07/17 1259  BP: 133/75  Pulse: 63  Resp: 18  Temp: (!) 97 F (36.1 C)  SpO2: 98%  Weight: 212 lb (96.2 kg)  Height: 5\' 9"  (1.753 m)    Body mass index is 31.31 kg/m.  General:  Alert and oriented, no acute distress HEENT: Normal Neck: No bruit or JVD Pulmonary: Clear to auscultation bilaterally Cardiac: Regular Rate and Rhythm without murmur Abdomen: Soft, non-tender, non-distended, no mass Skin: No rash Extremity Pulses:  2+ radial, brachial, femoral, dorsalis pedis, posterior tibial pulses bilaterally Musculoskeletal: No deformity or edema  Neurologic: Upper and lower extremity motor 5/5 and  symmetric  DATA:  I reviewed the patient's recent ABIs from [facility] which were greater than 1 triphasic normal bilaterally. Patient also had a venous duplex exam which showed no evidence of DVT.  ASSESSMENT:  Patient with normal noninvasive arterial and venous exam and fairly benign appearing extensive neurologic workup. He has pain in his calf which by history certainly sounds like claudication but no objective findings to support this. I think the only other possibility that we should rule out would be popliteal entrapment  syndrome.   PLAN:  Abdominal aortogram with lower extremity runoff with provocative maneuvers to rule out popliteal entrapment syndrome. This is scheduled for 10/16/2017. Risks benefits possible complications and procedure details were discussed with the patient and his wife today. These include but are not limited to contrast reaction bleeding infection. He understands and agrees to proceed.   Ruta Hinds, MD [facility] [facility] Office: [phone] Pager: [phone]

## 2017-10-07 NOTE — Progress Notes (Signed)
Referring Physician: Dr [name]  Patient name: Willie Burton MRN: [id number] DOB: Apr 04, 1946 Sex: male  REASON FOR CONSULT: Right calf pain with ambulation  HPI: Willie Burton is a 71 y.o. male with a 9 month history of pain in the right calf with walking. He states that prior to 9 months ago he could walk as much as he wished without any problems. Now at about one quarter of a mile he has tight pain in his right calf which radiates to the popliteal area which is relieved by several minutes of rest. The walking distance is very consistent. He denies rest pain. He has no nonhealing wounds. Other medical problems include coronary artery disease with prior vein harvest from both legs, hypertension, elevated cholesterol all of which are stable. He has no symptoms in the left leg. 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 He states this was benign. He is a former smoker but quit about 40 years ago.  He does have a history of carotid occlusive disease and has seen Dr. Cristal Generous in the past. Right carotid that time was 50% left was 50-70% this was in June 2018. He denies any family history of abdominal aortic aneurysm. He is on aspirin and statin. He had a fairly extensive neurologic workup including an MRI of the leg MRI of the cervical and lumbar spine all of which were really unremarkable.  He also had an EMG which was unremarkable.  Past Medical History:  Diagnosis Date  . 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 Arthritis   . Diverticula of colon   . Heart disease   . High blood pressure   . High cholesterol   . Hyperlipemia   . Prostate cancer [facility])    Past Surgical History:  Procedure Laterality Date  . APPENDECTOMY    . CATARACT EXTRACTION    . COLON SURGERY    . CORONARY ARTERY BYPASS GRAFT     x 3  . LIPOMA EXCISION Right    thigh  . METACARPOPHALANGEAL JOINT ARTHROPLASTY    . ULNAR NERVE TRANSPOSITION Left January  2015    Family History  Problem Relation Age of Onset  . COPD Mother        Deceased, 76  . Throat cancer Father        Deceased, 92s  . Healthy Brother   . Healthy Daughter   . Healthy Son     SOCIAL HISTORY: Social History   Socioeconomic History  . Marital status: Married    Spouse name: Not on file  . Number of children: 2  . Years of education: Masters  . Highest education level: Not on file  Social Needs  . Financial resource strain: Not on file  . Food insecurity - worry: Not on file  . Food insecurity - inability: Not on file  . Transportation needs - medical: Not on file  . Transportation needs - non-medical: Not on file  Occupational History  . Occupation: retired  Tobacco Use  . Smoking status: Former Smoker    Packs/day: 1.50    Years: 5.00    Pack years: 7.50    Types: Cigarettes  . Smokeless tobacco: Never Used  . Tobacco comment: Quit 40+ years ago  Substance and Sexual Activity  . Alcohol use: No    Alcohol/week: 0.0 oz  . Drug use: No  . Sexual activity: Not on file  Other Topics Concern  . Not on file  Social History Narrative   He lives with his wife.   Retired Chief Financial Officer.   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   1-2 cups caffeine per day.    Allergies  Allergen Reactions  . Hepatitis A Antigen   . Typhoid Vaccines   . Amoxicillin Rash    Current Outpatient Medications  Medication Sig Dispense Refill  . aspirin EC 81 MG tablet Take by mouth daily.     . magnesium oxide (MAG-OX) 400 MG tablet Take by mouth.    . metoprolol tartrate (LOPRESSOR) 25 MG tablet Take by mouth.    . rosuvastatin (CRESTOR) 5 MG tablet Take by mouth.    . vitamin B-12 (CYANOCOBALAMIN) 1000 MCG tablet Take 1,000 mcg by mouth daily. Reported on 12/31/2015    . gabapentin (NEURONTIN) 300 MG capsule Take 1 capsule (300 mg total) by mouth 3 (three) times daily. (Patient not taking: Reported on 10/07/2017) 90 capsule 11   No current  facility-administered medications for this visit.     ROS:   General:  No weight loss, Fever, chills  HEENT: No recent headaches, no nasal bleeding, no visual changes, no sore throat  Neurologic: No dizziness, blackouts, seizures. No recent symptoms of stroke or mini- stroke. No recent episodes of slurred speech, or temporary blindness.  Cardiac: No recent episodes of chest pain/pressure, no shortness of breath at rest.  No shortness of breath with exertion.  Denies history of atrial fibrillation or irregular heartbeat  Vascular: No history of rest pain in feet.  No history of claudication.  No history of non-healing ulcer, No history of DVT   Pulmonary: No home oxygen, no productive cough, no hemoptysis,  No asthma or wheezing  Musculoskeletal:  [X]  Arthritis, [X]  Low back pain,  [X]  Joint pain  Hematologic:No history of hypercoagulable state.  No history of easy bleeding.  No history of anemia  Gastrointestinal: No hematochezia or melena,  No gastroesophageal reflux, no trouble swallowing  Urinary: [ ]  chronic Kidney disease, [ ]  on HD - [ ]  MWF or [ ]  TTHS, [ ]  Burning with urination, [ ]  Frequent urination, [ ]  Difficulty urinating;   Skin: No rashes  Psychological: No history of anxiety,  No history of depression   Physical Examination  Vitals:   10/07/17 1259  BP: 133/75  Pulse: 63  Resp: 18  Temp: (!) 97 F (36.1 C)  SpO2: 98%  Weight: 212 lb (96.2 kg)  Height: 5\' 9"  (1.753 m)    Body mass index is 31.31 kg/m.  General:  Alert and oriented, no acute distress HEENT: Normal Neck: No bruit or JVD Pulmonary: Clear to auscultation bilaterally Cardiac: Regular Rate and Rhythm without murmur Abdomen: Soft, non-tender, non-distended, no mass Skin: No rash Extremity Pulses:  2+ radial, brachial, femoral, dorsalis pedis, posterior tibial pulses bilaterally Musculoskeletal: No deformity or edema  Neurologic: Upper and lower extremity motor 5/5 and  symmetric  DATA:  I reviewed the patient's recent ABIs from [facility] which were greater than 1 triphasic normal bilaterally. Patient also had a venous duplex exam which showed no evidence of DVT.  ASSESSMENT:  Patient with normal noninvasive arterial and venous exam and fairly benign appearing extensive neurologic workup. He has pain in his calf which by history certainly sounds like claudication but no objective findings to support this. I think the only other possibility that we should rule out would be popliteal entrapment  syndrome.   PLAN:  Abdominal aortogram with lower extremity runoff with provocative maneuvers to rule out popliteal entrapment syndrome. This is scheduled for 10/16/2017. Risks benefits possible complications and procedure details were discussed with the patient and his wife today. These include but are not limited to contrast reaction bleeding infection. He understands and agrees to proceed.   Ruta Hinds, MD [facility] [place] Office: [phone] Pager: [phone]

## 2017-10-08 ENCOUNTER — Other Ambulatory Visit: Payer: Self-pay | Admitting: *Deleted

## 2017-10-16 ENCOUNTER — Ambulatory Visit (HOSPITAL_COMMUNITY)
Admission: RE | Admit: 2017-10-16 | Discharge: 2017-10-16 | Disposition: A | Discharge status: Home / Self Care | Payer: Medicare Other | Source: Ambulatory Visit | Attending: Vascular Surgery | Admitting: Vascular Surgery

## 2017-10-16 ENCOUNTER — Encounter (HOSPITAL_COMMUNITY): Payer: Self-pay | Admitting: Vascular Surgery

## 2017-10-16 ENCOUNTER — Encounter (HOSPITAL_COMMUNITY)
Admission: RE | Disposition: A | Discharge status: Home / Self Care | Payer: Self-pay | Source: Ambulatory Visit | Attending: Vascular Surgery

## 2017-10-16 DIAGNOSIS — I70211 Atherosclerosis of native arteries of extremities with intermittent claudication, right leg: Secondary | ICD-10-CM

## 2017-10-16 DIAGNOSIS — I739 Peripheral vascular disease, unspecified: Secondary | ICD-10-CM | POA: Diagnosis not present

## 2017-10-16 DIAGNOSIS — I10 Essential (primary) hypertension: Secondary | ICD-10-CM | POA: Diagnosis not present

## 2017-10-16 DIAGNOSIS — C61 Malignant neoplasm of prostate: Secondary | ICD-10-CM | POA: Insufficient documentation

## 2017-10-16 DIAGNOSIS — Z7982 Long term (current) use of aspirin: Secondary | ICD-10-CM | POA: Diagnosis not present

## 2017-10-16 DIAGNOSIS — Z87891 Personal history of nicotine dependence: Secondary | ICD-10-CM | POA: Insufficient documentation

## 2017-10-16 DIAGNOSIS — I2510 Atherosclerotic heart disease of native coronary artery without angina pectoris: Secondary | ICD-10-CM | POA: Insufficient documentation

## 2017-10-16 DIAGNOSIS — Z79899 Other long term (current) drug therapy: Secondary | ICD-10-CM | POA: Diagnosis not present

## 2017-10-16 DIAGNOSIS — F1011 Alcohol abuse, in remission: Secondary | ICD-10-CM | POA: Diagnosis not present

## 2017-10-16 DIAGNOSIS — E7800 Pure hypercholesterolemia, unspecified: Secondary | ICD-10-CM | POA: Insufficient documentation

## 2017-10-16 HISTORY — PX: ABDOMINAL AORTOGRAM W/LOWER EXTREMITY: CATH118223

## 2017-10-16 LAB — POCT I-STAT, CHEM 8
BUN: 17 mg/dL (ref 6–20)
CALCIUM ION: 1.12 mmol/L — AB (ref 1.15–1.40)
CHLORIDE: 107 mmol/L (ref 101–111)
CREATININE: 0.8 mg/dL (ref 0.61–1.24)
GLUCOSE: 96 mg/dL (ref 65–99)
HCT: 44 % (ref 39.0–52.0)
HEMOGLOBIN: 15 g/dL (ref 13.0–17.0)
Potassium: 4.1 mmol/L (ref 3.5–5.1)
Sodium: 139 mmol/L (ref 135–145)
TCO2: 24 mmol/L (ref 22–32)

## 2017-10-16 SURGERY — ABDOMINAL AORTOGRAM W/LOWER EXTREMITY
Anesthesia: LOCAL

## 2017-10-16 MED ORDER — SODIUM CHLORIDE 0.9 % IV SOLN
INTRAVENOUS | Status: DC
  Administered 2017-10-16: 06:00:00 via INTRAVENOUS

## 2017-10-16 MED ORDER — HEPARIN (PORCINE) IN NACL 2-0.9 UNIT/ML-% IJ SOLN
INTRAMUSCULAR | Status: AC
  Filled 2017-10-16: qty 1000

## 2017-10-16 MED ORDER — LABETALOL HCL 5 MG/ML IV SOLN: 10.0000 mg | INTRAVENOUS | Status: DC | PRN

## 2017-10-16 MED ORDER — OXYCODONE HCL 5 MG PO TABS: 5.0000 mg | ORAL_TABLET | ORAL | Status: DC | PRN

## 2017-10-16 MED ORDER — SODIUM CHLORIDE 0.9% FLUSH: 3.0000 mL | INTRAVENOUS | Status: DC | PRN

## 2017-10-16 MED ORDER — LIDOCAINE HCL (PF) 1 % IJ SOLN
INTRAMUSCULAR | Status: DC | PRN
  Administered 2017-10-16: 10 mL

## 2017-10-16 MED ORDER — IODIXANOL 320 MG/ML IV SOLN
INTRAVENOUS | Status: DC | PRN
  Administered 2017-10-16: 97 mL via INTRA_ARTERIAL

## 2017-10-16 MED ORDER — HEPARIN (PORCINE) IN NACL 2-0.9 UNIT/ML-% IJ SOLN
INTRAMUSCULAR | Status: AC | PRN
  Administered 2017-10-16: 1000 mL via INTRA_ARTERIAL

## 2017-10-16 MED ORDER — SODIUM CHLORIDE 0.9 % IV SOLN: 250.0000 mL | INTRAVENOUS | Status: DC | PRN

## 2017-10-16 MED ORDER — HYDRALAZINE HCL 20 MG/ML IJ SOLN: 5.0000 mg | INTRAMUSCULAR | Status: DC | PRN

## 2017-10-16 MED ORDER — SODIUM CHLORIDE 0.9% FLUSH: 3.0000 mL | Freq: Two times a day (BID) | INTRAVENOUS | Status: DC

## 2017-10-16 MED ORDER — SODIUM CHLORIDE 0.9 % IV SOLN: INTRAVENOUS | Status: DC

## 2017-10-16 MED ORDER — MORPHINE SULFATE (PF) 10 MG/ML IV SOLN: 2.0000 mg | INTRAVENOUS | Status: DC | PRN

## 2017-10-16 MED ORDER — LIDOCAINE HCL (PF) 1 % IJ SOLN
INTRAMUSCULAR | Status: AC
  Filled 2017-10-16: qty 30

## 2017-10-16 SURGICAL SUPPLY — 10 items
CATH ANGIO 5F PIGTAIL 65CM (CATHETERS) ×2 IMPLANT
COVER PRB 48X5XTLSCP FOLD TPE (BAG) ×1 IMPLANT
COVER PROBE 5X48 (BAG) ×1
KIT PV (KITS) ×2 IMPLANT
SHEATH PINNACLE 5F 10CM (SHEATH) ×2 IMPLANT
SYR MEDRAD MARK V 150ML (SYRINGE) ×2 IMPLANT
TRANSDUCER W/STOPCOCK (MISCELLANEOUS) ×2 IMPLANT
TRAY PV CATH (CUSTOM PROCEDURE TRAY) ×2 IMPLANT
WIRE HITORQ VERSACORE ST 145CM (WIRE) ×2 IMPLANT
WIRE PT2 LS 185 (WIRE) IMPLANT

## 2017-10-16 NOTE — Discharge Instructions (Signed)
Angiogram, Care After °This sheet gives you information about how to care for yourself after your procedure. Your health care provider may also give you more specific instructions. If you have problems or questions, contact your health care provider. °What can I expect after the procedure? °After the procedure, it is common to have bruising and tenderness at the catheter insertion area. °Follow these instructions at home: °Insertion site care  °· Follow instructions from your health care provider about how to take care of your insertion site. Make sure you: °¨ Wash your hands with soap and water before you change your bandage (dressing). If soap and water are not available, use hand sanitizer. °¨ Change your dressing as told by your health care provider. °¨ Leave stitches (sutures), skin glue, or adhesive strips in place. These skin closures may need to stay in place for 2 weeks or longer. If adhesive strip edges start to loosen and curl up, you may trim the loose edges. Do not remove adhesive strips completely unless your health care provider tells you to do that. °· Do not take baths, swim, or use a hot tub until your health care provider approves. °· You may shower 24-48 hours after the procedure or as told by your health care provider. °¨ Gently wash the site with plain soap and water. °¨ Pat the area dry with a clean towel. °¨ Do not rub the site. This may cause bleeding. °· Do not apply powder or lotion to the site. Keep the site clean and dry. °· Check your insertion site every day for signs of infection. Check for: °¨ Redness, swelling, or pain. °¨ Fluid or blood. °¨ Warmth. °¨ Pus or a bad smell. °Activity  °· Rest as told by your health care provider, usually for 1-2 days. °· Do not lift anything that is heavier than 10 lbs. (4.5 kg) or as told by your health care provider. °· Do not drive for 24 hours if you were given a medicine to help you relax (sedative). °· Do not drive or use heavy machinery while  taking prescription pain medicine. °General instructions  °· Return to your normal activities as told by your health care provider, usually in about a week. Ask your health care provider what activities are safe for you. °· If the catheter site starts bleeding, lie flat and put pressure on the site. If the bleeding does not stop, get help right away. This is a medical emergency. °· Drink enough fluid to keep your urine clear or pale yellow. This helps flush the contrast dye from your body. °· Take over-the-counter and prescription medicines only as told by your health care provider. °· Keep all follow-up visits as told by your health care provider. This is important. °Contact a health care provider if: °· You have a fever or chills. °· You have redness, swelling, or pain around your insertion site. °· You have fluid or blood coming from your insertion site. °· The insertion site feels warm to the touch. °· You have pus or a bad smell coming from your insertion site. °· You have bruising around the insertion site. °· You notice blood collecting in the tissue around the catheter site (hematoma). The hematoma may be painful to the touch. °Get help right away if: °· You have severe pain at the catheter insertion area. °· The catheter insertion area swells very fast. °· The catheter insertion area is bleeding, and the bleeding does not stop when you hold steady pressure on   the area. °· The area near or just beyond the catheter insertion site becomes pale, cool, tingly, or numb. °These symptoms may represent a serious problem that is an emergency. Do not wait to see if the symptoms will go away. Get medical help right away. Call your local emergency services (911 in the [place] Do not drive yourself to the hospital. °Summary °· After the procedure, it is common to have bruising and tenderness at the catheter insertion area. °· After the procedure, it is important to rest and drink plenty of fluids. °· Do not take baths,  swim, or use a hot tub until your health care provider says it is okay to do so. You may shower 24-48 hours after the procedure or as told by your health care provider. °· If the catheter site starts bleeding, lie flat and put pressure on the site. If the bleeding does not stop, get help right away. This is a medical emergency. °This information is not intended to replace advice given to you by your health care provider. Make sure you discuss any questions you have with your health care provider. °Document Released: 06/05/2005 Document Revised: 10/22/2016 Document Reviewed: 10/22/2016 °Elsevier Interactive Patient Education © 2017 [employer] ° °

## 2017-10-16 NOTE — Interval H&P Note (Signed)
History and Physical Interval Note:  10/16/2017 7:11 AM  Willie Burton  has presented today for surgery, with the diagnosis of pvd with claudication  The various methods of treatment have been discussed with the patient and family. After consideration of risks, benefits and other options for treatment, the patient has consented to  Procedure(s): ABDOMINAL AORTOGRAM W/LOWER EXTREMITY (N/A) as a surgical intervention .  The patient's history has been reviewed, patient examined, no change in status, stable for surgery.  I have reviewed the patient's chart and labs.  Questions were answered to the patient's satisfaction.     Ruta Hinds

## 2017-10-16 NOTE — Op Note (Addendum)
Procedure: Abdominal aortogram with bilateral lower extremity runoff  Preoperative diagnosis: Claudication right leg. Postoperative diagnosis: Same  Anesthesia: Local  Operative findings: #1 left femoral 5 French sheath #2 right popliteal artery short segment occlusion #3 bilateral 3 vessel runoff  Operative details: After obtaining informed consent, the patient was taken to the [place] lab. The patient was placed in supine position the Angio table. Both groins were prepped and draped in usual sterile fashion. Local anesthesia was infiltrated over the left common femoral artery. Ultrasound was used to identify the left common femoral artery and femoral bifurcation. An introducer needle was then used to cannulate the left common femoral artery under ultrasound guidance. An 035 versacore wire was threaded up the abdominal aorta under fluoroscopic guidance. A 5 French sheath was placed over the guidewire and the left common femoral artery. This was thoroughly flushed with heparin saline. A 5 French pig catheter was advanced over the guidewire into the abdominal aorta and abdominal aortogram was obtained in AP projection. The left and right renal arteries are widely patent. The infrarenal abdominal aorta is widely patent. Left and right common external and internal iliac arteries are all widely patent. Next the pectoral catheter was pulled down just above the aortic bifurcation and bilateral lower extremity runoff views were obtained.  In the left lower extremity, the left common femoral superficial femoral profunda femoris popliteal anterior tibial fistula tibial and peroneal arteries are all widely patent.  In the right lower extremity, the right common femoral profunda femoris is widely patent. The right superficial femoral artery is patent. However, there is an occlusion at the adductor hiatus at the proximal popliteal distal superficial femoral artery. This extends over about 1 cm. It is a round structure  either consistent with exophytic ruptured plaque versus possible embolus. Just distal to this the popliteal artery and distal runoff are are all normal and widely patent with 3 vessels to the right foot.  At this point the pigtail catheter was removed over a guidewire. The 5 French sheath was left in place to be pulled in the holding area.  Operative management: The patient will be scheduled in the near future for exploration of his right distal superficial femoral proximal popliteal artery with repair of the artery or possible bypass.  Ruta Hinds, MD [facility] [place] Office: [phone] Pager: [phone]

## 2017-10-16 NOTE — Progress Notes (Signed)
Site area: Right groin a 5 french arterial sheath was removed  Site Prior to Removal:  Level 0  Pressure Applied for 20 MINUTES    Bedrest Beginning at 0835a Manual:   Yes.    Patient Status During Pull:  stable  Post Pull Groin Site:  Level 0  Post Pull Instructions Given:  Yes.    Post Pull Pulses Present:  Yes.    Dressing Applied:  Yes.    Comments:  VS remain stable

## 2017-10-19 ENCOUNTER — Other Ambulatory Visit: Payer: Self-pay | Admitting: *Deleted

## 2017-11-06 NOTE — Pre-Procedure Instructions (Signed)
Willie Burton  11/06/2017      [facility] [address] [address], [place] - [place] AT [facility] OF [address] [place] [address] Phone: [phone] Fax: [phone]    Your procedure is scheduled on Wednesday, November 11, 2017  Report to [facility] Admitting Entrance "A" at 6:30AM   Call this number if you have problems the morning of surgery:  [phone]   Remember:  Do not eat food or drink liquids after midnight.  Take these medicines the morning of surgery with A SIP OF WATER: Metoprolol tartrate (LOPRESSOR).  Follow your doctor's instruction regarding Aspirin.   As of today, stop taking all Aspirins, Vitamins, Fish oils, and Herbal medications. Also stop all NSAIDS i.e. Advil, Ibuprofen, Motrin, Aleve, Anaprox, Naproxen, BC and Goody Powders.   Do not wear jewelry.  Do not wear lotions, powders, colognes, or deodorant.  Do not shave 48 hours prior to surgery.  Men may shave face and neck.  Do not bring valuables to the hospital.  [facility] is not responsible for any belongings or valuables.  Contacts, dentures or bridgework may not be worn into surgery.  Leave your suitcase in the car.  After surgery it may be brought to your room.  For patients admitted to the hospital, discharge time will be determined by your treatment team.  Patients discharged the day of surgery will not be allowed to drive home.   Special instructions:  [facility]- Preparing For Surgery  Before surgery, you can play an important role. Because skin is not sterile, your skin needs to be as free of germs as possible. You can reduce the number of germs on your skin by washing with CHG (chlorahexidine gluconate) Soap before surgery.  CHG is an antiseptic cleaner which kills germs and bonds with the skin to continue killing germs even after washing.  Please do not use if you have an allergy to CHG or antibacterial soaps. If your skin  becomes reddened/irritated stop using the CHG.  Do not shave (including legs and underarms) for at least 48 hours prior to first CHG shower. It is OK to shave your face.  Please follow these instructions carefully.   1. Shower the NIGHT BEFORE SURGERY and the MORNING OF SURGERY with CHG.   2. If you chose to wash your hair, wash your hair first as usual with your normal shampoo.  3. After you shampoo, rinse your hair and body thoroughly to remove the shampoo.  4. Use CHG as you would any other liquid soap. You can apply CHG directly to the skin and wash gently with a scrungie or a clean washcloth.   5. Apply the CHG Soap to your body ONLY FROM THE NECK DOWN.  Do not use on open wounds or open sores. Avoid contact with your eyes, ears, mouth and genitals (private parts). Wash Face and genitals (private parts)  with your normal soap.  6. Wash thoroughly, paying special attention to the area where your surgery will be performed.  7. Thoroughly rinse your body with warm water from the neck down.  8. DO NOT shower/wash with your normal soap after using and rinsing off the CHG Soap.  9. Pat yourself dry with a CLEAN TOWEL.  10. Wear CLEAN PAJAMAS to bed the night before surgery, wear comfortable clothes the morning of surgery  11. Place CLEAN SHEETS on your bed the night of your first shower  and DO NOT SLEEP WITH PETS.  Day of Surgery: Do not apply any deodorants/lotions. Please wear clean clothes to the hospital/surgery center.    Please read over the following fact sheets that you were given. Pain Booklet, Coughing and Deep Breathing, Blood Transfusion Information, MRSA Information and Surgical Site Infection Prevention

## 2017-11-09 ENCOUNTER — Encounter (HOSPITAL_COMMUNITY): Payer: Self-pay

## 2017-11-09 ENCOUNTER — Other Ambulatory Visit: Payer: Self-pay

## 2017-11-09 ENCOUNTER — Encounter (HOSPITAL_COMMUNITY)
Admission: RE | Admit: 2017-11-09 | Discharge: 2017-11-09 | Disposition: A | Discharge status: Home / Self Care | Payer: Medicare Other | Source: Ambulatory Visit | Attending: Vascular Surgery | Admitting: Vascular Surgery

## 2017-11-09 DIAGNOSIS — Z79899 Other long term (current) drug therapy: Secondary | ICD-10-CM | POA: Insufficient documentation

## 2017-11-09 DIAGNOSIS — Z7982 Long term (current) use of aspirin: Secondary | ICD-10-CM

## 2017-11-09 DIAGNOSIS — Z01812 Encounter for preprocedural laboratory examination: Secondary | ICD-10-CM | POA: Insufficient documentation

## 2017-11-09 DIAGNOSIS — E7800 Pure hypercholesterolemia, unspecified: Secondary | ICD-10-CM

## 2017-11-09 DIAGNOSIS — Z87891 Personal history of nicotine dependence: Secondary | ICD-10-CM

## 2017-11-09 DIAGNOSIS — I10 Essential (primary) hypertension: Secondary | ICD-10-CM

## 2017-11-09 DIAGNOSIS — I2510 Atherosclerotic heart disease of native coronary artery without angina pectoris: Secondary | ICD-10-CM

## 2017-11-09 DIAGNOSIS — Z951 Presence of aortocoronary bypass graft: Secondary | ICD-10-CM

## 2017-11-09 DIAGNOSIS — I6529 Occlusion and stenosis of unspecified carotid artery: Secondary | ICD-10-CM | POA: Insufficient documentation

## 2017-11-09 HISTORY — DX: Cardiac arrhythmia, unspecified: I49.9

## 2017-11-09 HISTORY — DX: Peripheral vascular disease, unspecified: I73.9

## 2017-11-09 LAB — COMPREHENSIVE METABOLIC PANEL
ALBUMIN: 4 g/dL (ref 3.5–5.0)
ALK PHOS: 62 U/L (ref 38–126)
ALT: 30 U/L (ref 17–63)
AST: 32 U/L (ref 15–41)
Anion gap: 7 (ref 5–15)
BILIRUBIN TOTAL: 1.4 mg/dL — AB (ref 0.3–1.2)
BUN: 14 mg/dL (ref 6–20)
CALCIUM: 9.2 mg/dL (ref 8.9–10.3)
CO2: 25 mmol/L (ref 22–32)
CREATININE: 0.96 mg/dL (ref 0.61–1.24)
Chloride: 104 mmol/L (ref 101–111)
GFR calc Af Amer: >60 mL/min (ref >60)
GFR calc non Af Amer: >60 mL/min (ref >60)
GLUCOSE: 74 mg/dL (ref 65–99)
Potassium: 3.9 mmol/L (ref 3.5–5.1)
SODIUM: 136 mmol/L (ref 135–145)
Total Protein: 7 g/dL (ref 6.5–8.1)

## 2017-11-09 LAB — URINALYSIS, ROUTINE W REFLEX MICROSCOPIC
Bilirubin Urine: NEGATIVE
Glucose, UA: NEGATIVE mg/dL
Hgb urine dipstick: NEGATIVE
Ketones, ur: NEGATIVE mg/dL
LEUKOCYTES UA: NEGATIVE
NITRITE: NEGATIVE
PROTEIN: NEGATIVE mg/dL
Specific Gravity, Urine: 1.02 (ref 1.005–1.030)
pH: 5 (ref 5.0–8.0)

## 2017-11-09 LAB — CBC
HEMATOCRIT: 45.4 % (ref 39.0–52.0)
HEMOGLOBIN: 15.6 g/dL (ref 13.0–17.0)
MCH: 32.2 pg (ref 26.0–34.0)
MCHC: 34.4 g/dL (ref 30.0–36.0)
MCV: 93.8 fL (ref 78.0–100.0)
Platelets: 197 10*3/uL (ref 150–400)
RBC: 4.84 MIL/uL (ref 4.22–5.81)
RDW: 13.4 % (ref 11.5–15.5)
WBC: 6.1 10*3/uL (ref 4.0–10.5)

## 2017-11-09 LAB — PROTIME-INR
INR: 0.94
Prothrombin Time: 12.5 seconds (ref 11.4–15.2)

## 2017-11-09 LAB — APTT: APTT: 29 s (ref 24–36)

## 2017-11-09 LAB — TYPE AND SCREEN
ABO/RH(D): O POS
Antibody Screen: NEGATIVE

## 2017-11-09 LAB — SURGICAL PCR SCREEN
MRSA, PCR: NEGATIVE
STAPHYLOCOCCUS AUREUS: POSITIVE — AB

## 2017-11-09 LAB — ABO/RH: ABO/RH(D): O POS

## 2017-11-09 MED ORDER — CHLORHEXIDINE GLUCONATE CLOTH 2 % EX PADS: 6.0000 | MEDICATED_PAD | Freq: Once | CUTANEOUS | Status: DC

## 2017-11-09 NOTE — Progress Notes (Signed)
PCP: Dr. [name]  Cardiologist: Dr. Ubaldo Glassing  EKG: 10/16/17 in EPIC  Stress test: 06/14/17 in EPIC  ECHO: 11/20/15 in Epic   Cardiac Cath: 11/13/15 in EPIC  Chest x-ray: pt denies past year, no recent respiratory infections/complications

## 2017-11-10 ENCOUNTER — Encounter (HOSPITAL_COMMUNITY): Payer: Self-pay

## 2017-11-10 NOTE — Anesthesia Preprocedure Evaluation (Addendum)
Anesthesia Evaluation  Patient identified by MRN, date of birth, ID band Patient awake    Reviewed: Allergy & Precautions, NPO status , Patient's Chart, lab work & pertinent test results, reviewed documented beta blocker date and time   Airway Mallampati: I  TM Distance: >3 FB Neck ROM: Full    Dental no notable dental hx. (+) Dental Advisory Given, Caps, Teeth Intact   Pulmonary former smoker,    Pulmonary exam normal breath sounds clear to auscultation       Cardiovascular hypertension, Pt. on home beta blockers + CAD, + CABG (11/2015) and + Peripheral Vascular Disease  Normal cardiovascular exam Rhythm:Regular Rate:Normal  ECG: SB, 1st degree AV block. RBBB. Rate 47  Sees cardiologist  Exercise Myocardial Perfusion Imaging 06/04/17 ([place]): LVEF= 57% FINDINGS: Regional wall motion:reveals normal myocardial thickening and wall  motion. The overall quality of the study is good. Artifacts noted: no Left ventricular cavity: normal. Perfusion Analysis:SPECT images demonstrate homogeneous tracer  distribution throughout the myocardium. Negative ETT.LV function normal.No evidence of reversible ischemia. Low risk study.  48 Hour Holter Monitor 05/25/17 ([place]): Summary: 1.Sinus rhythm with PACs and PVCs.No sustained arrhythmias.No pauses.     Neuro/Psych negative neurological ROS  negative psych ROS   GI/Hepatic negative GI ROS, (+)     substance abuse  ,   Endo/Other  negative endocrine ROS  Renal/GU negative Renal ROS     Musculoskeletal negative musculoskeletal ROS (+)   Abdominal (+) + obese,   Peds  Hematology negative hematology ROS (+)   Anesthesia Other Findings HLD  Reproductive/Obstetrics                           Anesthesia Physical Anesthesia Plan  ASA: III  Anesthesia Plan: General   Post-op Pain Management:     Induction: Intravenous  PONV Risk Score and Plan: 2 and Dexamethasone and Ondansetron  Airway Management Planned: Oral ETT  Additional Equipment:   Intra-op Plan:   Post-operative Plan: Extubation in OR  Informed Consent: I have reviewed the patients History and Physical, chart, labs and discussed the procedure including the risks, benefits and alternatives for the proposed anesthesia with the patient or authorized representative who has indicated his/her understanding and acceptance.   Dental advisory given  Plan Discussed with: CRNA  Anesthesia Plan Comments:        Anesthesia Quick Evaluation

## 2017-11-10 NOTE — Progress Notes (Signed)
Anesthesia Chart Review: Patient is a 71 year old male scheduled for exploration right femoral to popliteal artery, possible repair, possible bypass on 11/11/17 by Dr. Ruta Hinds.   History includes former smoker (quit 40+ years ago), CAD s/p CABG (LIMA-LAD, SVG-OM1, SVG-PDA 11/20/15, Dr. 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  - PCP is Dr. [name]. Last visit 10/07/17 (Care Everywhere). - Cardiologist is Dr. Bartholome Bill. Last visit 09/11/17 (Care Everywhere). No new testing recommended. Six month follow-up planned. - Carotid stenosis has been followed by vascular surgeon Dr. Leotis Pain. - Neurologist is Dr. Marcial Pacas.  Meds include aspirin 81 mg, magnesium oxide, Lopressor, Crestor.  BP 131/68   Pulse 61   Temp 36.4 C (Oral)   Resp 16   Ht 5\' 9"  (1.753 m)   Wt 215 lb 6.4 oz (97.7 kg)   SpO2 98%   BMI 31.81 kg/m   EKG 10/16/17: SB at 47 bpm, first degree AV block, incomplete right BBB. HR was 61 bpm at PAT.  Exercise Myocardial Perfusion Imaging 06/04/17 ([place]): LVEF= 57% FINDINGS: Regional wall motion:reveals normal myocardial thickening and wall  motion. The overall quality of the study is good. Artifacts noted: no Left ventricular cavity: normal. Perfusion Analysis:SPECT images demonstrate homogeneous tracer  distribution throughout the myocardium. Negative ETT.LV function normal.No evidence of reversible ischemia. Low risk study.  48 Hour Holter Monitor 05/25/17 ([place]): Summary: 1.Sinus rhythm with PACs and PVCs.No sustained arrhythmias.No pauses.  Last cath was 11/13/15 PRE-CABG and showed 2V CAD with preserved LVF. Abnormal 11/08/15 stress echo ([place]) lead to cardiac cath. There was no  significant valvular disease (trivial MR/AR, mild TR/PR. No valvular stenosis). By CABG op note, post-bypass TEE revealed preserved RV/LV function and no new wall motion abnormalities.   According to Dr. Bunnie Domino 05/05/17 office note, "A carotid duplex demonstrated mild plaque in the right carotid artery with velocities showing no greater than 50% stenosis. His left carotid artery demonstrated moderate plaque with velocities that were read in the 50-70% range by duplex criteria." Continued surveillance recommended. Six month follow-up planned.  Preoperative labs noted. Cr 0.96, glucose 74, total bilirubin 1.4. AST/ALT, CBC, PT/PTT, UA WNL.   Patient with recent PCP and cardiologist follow-up. Non-ischemic stress test early this year. If no acute changes then I anticipate that he can proceed as planned.  George Hugh [facility] Short Stay Center/Anesthesiology Phone [phone] 11/10/2017 10:03 AM

## 2017-11-11 ENCOUNTER — Inpatient Hospital Stay (HOSPITAL_COMMUNITY): Payer: Medicare Other | Admitting: Vascular Surgery

## 2017-11-11 ENCOUNTER — Inpatient Hospital Stay (HOSPITAL_COMMUNITY)
Admission: RE | Admit: 2017-11-11 | Discharge: 2017-11-13 | DRG: 254 | Disposition: A | Discharge status: Home / Self Care | Payer: Medicare Other | Source: Ambulatory Visit | Attending: Vascular Surgery | Admitting: Vascular Surgery

## 2017-11-11 ENCOUNTER — Encounter (HOSPITAL_COMMUNITY)
Admission: RE | Disposition: A | Discharge status: Home / Self Care | Payer: Self-pay | Source: Ambulatory Visit | Attending: Vascular Surgery

## 2017-11-11 ENCOUNTER — Inpatient Hospital Stay (HOSPITAL_COMMUNITY): Payer: Medicare Other | Admitting: Anesthesiology

## 2017-11-11 ENCOUNTER — Encounter (HOSPITAL_COMMUNITY): Payer: Self-pay | Admitting: Certified Registered"

## 2017-11-11 ENCOUNTER — Other Ambulatory Visit: Payer: Self-pay

## 2017-11-11 DIAGNOSIS — E785 Hyperlipidemia, unspecified: Secondary | ICD-10-CM | POA: Diagnosis present

## 2017-11-11 DIAGNOSIS — I4510 Unspecified right bundle-branch block: Secondary | ICD-10-CM | POA: Diagnosis present

## 2017-11-11 DIAGNOSIS — Z9849 Cataract extraction status, unspecified eye: Secondary | ICD-10-CM | POA: Diagnosis not present

## 2017-11-11 DIAGNOSIS — I10 Essential (primary) hypertension: Secondary | ICD-10-CM | POA: Diagnosis present

## 2017-11-11 DIAGNOSIS — Z880 Allergy status to penicillin: Secondary | ICD-10-CM | POA: Diagnosis not present

## 2017-11-11 DIAGNOSIS — Z87891 Personal history of nicotine dependence: Secondary | ICD-10-CM | POA: Diagnosis not present

## 2017-11-11 DIAGNOSIS — Z79899 Other long term (current) drug therapy: Secondary | ICD-10-CM

## 2017-11-11 DIAGNOSIS — Z887 Allergy status to serum and vaccine status: Secondary | ICD-10-CM | POA: Diagnosis not present

## 2017-11-11 DIAGNOSIS — I440 Atrioventricular block, first degree: Secondary | ICD-10-CM | POA: Diagnosis present

## 2017-11-11 DIAGNOSIS — I739 Peripheral vascular disease, unspecified: Secondary | ICD-10-CM | POA: Diagnosis present

## 2017-11-11 DIAGNOSIS — Z951 Presence of aortocoronary bypass graft: Secondary | ICD-10-CM | POA: Diagnosis not present

## 2017-11-11 DIAGNOSIS — Z7982 Long term (current) use of aspirin: Secondary | ICD-10-CM

## 2017-11-11 DIAGNOSIS — I2510 Atherosclerotic heart disease of native coronary artery without angina pectoris: Secondary | ICD-10-CM | POA: Diagnosis present

## 2017-11-11 DIAGNOSIS — I70211 Atherosclerosis of native arteries of extremities with intermittent claudication, right leg: Secondary | ICD-10-CM

## 2017-11-11 HISTORY — PX: PATCH ANGIOPLASTY: SHX6230

## 2017-11-11 HISTORY — PX: FEMORAL-POPLITEAL BYPASS GRAFT: SHX937

## 2017-11-11 HISTORY — PX: ENDARTERECTOMY POPLITEAL: SHX5806

## 2017-11-11 LAB — CBC
HCT: 42.4 % (ref 39.0–52.0)
HEMOGLOBIN: 14.5 g/dL (ref 13.0–17.0)
MCH: 31.9 pg (ref 26.0–34.0)
MCHC: 34.2 g/dL (ref 30.0–36.0)
MCV: 93.2 fL (ref 78.0–100.0)
PLATELETS: 181 10*3/uL (ref 150–400)
RBC: 4.55 MIL/uL (ref 4.22–5.81)
RDW: 13.1 % (ref 11.5–15.5)
WBC: 9.7 10*3/uL (ref 4.0–10.5)

## 2017-11-11 LAB — CREATININE, SERUM
CREATININE: 0.85 mg/dL (ref 0.61–1.24)
GFR calc non Af Amer: >60 mL/min (ref >60)

## 2017-11-11 SURGERY — BYPASS GRAFT FEMORAL-POPLITEAL ARTERY
Anesthesia: General | Site: Leg Upper | Laterality: Right

## 2017-11-11 MED ORDER — ROSUVASTATIN CALCIUM 10 MG PO TABS
5.0000 mg | ORAL_TABLET | Freq: Every day | ORAL | Status: DC
  Administered 2017-11-11 – 2017-11-12 (×2): 5 mg via ORAL
  Filled 2017-11-11 (×2): qty 1

## 2017-11-11 MED ORDER — VITAMIN D 1000 UNITS PO TABS
1000.0000 [IU] | ORAL_TABLET | Freq: Every day | ORAL | Status: DC
  Administered 2017-11-11 – 2017-11-13 (×3): 1000 [IU] via ORAL
  Filled 2017-11-11 (×3): qty 1

## 2017-11-11 MED ORDER — EPHEDRINE SULFATE-NACL 50-0.9 MG/10ML-% IV SOSY
PREFILLED_SYRINGE | INTRAVENOUS | Status: DC | PRN
  Administered 2017-11-11: 15 mg via INTRAVENOUS
  Administered 2017-11-11 (×2): 10 mg via INTRAVENOUS

## 2017-11-11 MED ORDER — PROMETHAZINE HCL 25 MG/ML IJ SOLN
6.2500 mg | INTRAMUSCULAR | Status: AC | PRN
  Administered 2017-11-11: 6.25 mg via INTRAVENOUS

## 2017-11-11 MED ORDER — PROPOFOL 10 MG/ML IV BOLUS
INTRAVENOUS | Status: DC | PRN
  Administered 2017-11-11: 200 mg via INTRAVENOUS

## 2017-11-11 MED ORDER — VITAMIN B-12 1000 MCG PO TABS
1000.0000 ug | ORAL_TABLET | Freq: Every day | ORAL | Status: DC
  Administered 2017-11-11 – 2017-11-13 (×3): 1000 ug via ORAL
  Filled 2017-11-11 (×3): qty 1

## 2017-11-11 MED ORDER — VANCOMYCIN HCL 1000 MG IV SOLR
INTRAVENOUS | Status: DC | PRN
  Administered 2017-11-11: 1000 mg via INTRAVENOUS

## 2017-11-11 MED ORDER — ROCURONIUM BROMIDE 10 MG/ML (PF) SYRINGE
PREFILLED_SYRINGE | INTRAVENOUS | Status: AC
  Filled 2017-11-11: qty 5

## 2017-11-11 MED ORDER — PANTOPRAZOLE SODIUM 40 MG PO TBEC
40.0000 mg | DELAYED_RELEASE_TABLET | Freq: Every day | ORAL | Status: DC
  Administered 2017-11-11 – 2017-11-13 (×3): 40 mg via ORAL
  Filled 2017-11-11 (×3): qty 1

## 2017-11-11 MED ORDER — ONDANSETRON HCL 4 MG/2ML IJ SOLN
INTRAMUSCULAR | Status: AC
  Filled 2017-11-11: qty 2

## 2017-11-11 MED ORDER — HEPARIN SODIUM (PORCINE) 1000 UNIT/ML IJ SOLN
INTRAMUSCULAR | Status: DC | PRN
  Administered 2017-11-11: 10000 [IU] via INTRAVENOUS

## 2017-11-11 MED ORDER — HEPARIN SODIUM (PORCINE) 1000 UNIT/ML IJ SOLN
INTRAMUSCULAR | Status: AC
  Filled 2017-11-11: qty 1

## 2017-11-11 MED ORDER — FENTANYL CITRATE (PF) 250 MCG/5ML IJ SOLN
INTRAMUSCULAR | Status: AC
  Filled 2017-11-11: qty 5

## 2017-11-11 MED ORDER — SODIUM CHLORIDE 0.9 % IV SOLN
INTRAVENOUS | Status: DC
  Administered 2017-11-11 (×2): via INTRAVENOUS

## 2017-11-11 MED ORDER — GUAIFENESIN-DM 100-10 MG/5ML PO SYRP: 15.0000 mL | ORAL_SOLUTION | ORAL | Status: DC | PRN

## 2017-11-11 MED ORDER — OXYCODONE HCL 5 MG PO TABS
ORAL_TABLET | ORAL | Status: AC
  Administered 2017-11-11: 5 mg via ORAL
  Filled 2017-11-11: qty 1

## 2017-11-11 MED ORDER — PROTAMINE SULFATE 10 MG/ML IV SOLN
INTRAVENOUS | Status: AC
  Filled 2017-11-11: qty 5

## 2017-11-11 MED ORDER — VANCOMYCIN HCL IN DEXTROSE 1-5 GM/200ML-% IV SOLN: 1000.0000 mg | INTRAVENOUS | Status: DC

## 2017-11-11 MED ORDER — PROPOFOL 10 MG/ML IV BOLUS
INTRAVENOUS | Status: AC
  Filled 2017-11-11: qty 20

## 2017-11-11 MED ORDER — OXYCODONE-ACETAMINOPHEN 5-325 MG PO TABS
1.0000 | ORAL_TABLET | ORAL | Status: DC | PRN
  Administered 2017-11-11 – 2017-11-12 (×5): 2 via ORAL
  Filled 2017-11-11 (×5): qty 2

## 2017-11-11 MED ORDER — ONDANSETRON HCL 4 MG/2ML IJ SOLN: 4.0000 mg | Freq: Four times a day (QID) | INTRAMUSCULAR | Status: DC | PRN

## 2017-11-11 MED ORDER — MIDAZOLAM HCL 5 MG/5ML IJ SOLN
INTRAMUSCULAR | Status: DC | PRN
  Administered 2017-11-11: 1 mg via INTRAVENOUS

## 2017-11-11 MED ORDER — OXYCODONE HCL 5 MG/5ML PO SOLN: 5.0000 mg | Freq: Once | ORAL | Status: AC | PRN

## 2017-11-11 MED ORDER — LIDOCAINE 2% (20 MG/ML) 5 ML SYRINGE
INTRAMUSCULAR | Status: AC
  Filled 2017-11-11: qty 5

## 2017-11-11 MED ORDER — ASPIRIN EC 81 MG PO TBEC
81.0000 mg | DELAYED_RELEASE_TABLET | Freq: Every day | ORAL | Status: DC
  Administered 2017-11-12 – 2017-11-13 (×2): 81 mg via ORAL
  Filled 2017-11-11 (×2): qty 1

## 2017-11-11 MED ORDER — POTASSIUM CHLORIDE CRYS ER 20 MEQ PO TBCR: 20.0000 meq | EXTENDED_RELEASE_TABLET | Freq: Every day | ORAL | Status: DC | PRN

## 2017-11-11 MED ORDER — OXYCODONE HCL 5 MG PO TABS
5.0000 mg | ORAL_TABLET | Freq: Once | ORAL | Status: AC | PRN
  Administered 2017-11-11: 5 mg via ORAL

## 2017-11-11 MED ORDER — SODIUM CHLORIDE 0.9 % IV SOLN
INTRAVENOUS | Status: DC | PRN
  Administered 2017-11-11: 10:00:00 500 mL

## 2017-11-11 MED ORDER — MAGNESIUM SULFATE 2 GM/50ML IV SOLN
2.0000 g | Freq: Every day | INTRAVENOUS | Status: DC | PRN
  Filled 2017-11-11: qty 50

## 2017-11-11 MED ORDER — PHENOL 1.4 % MT LIQD: 1.0000 | OROMUCOSAL | Status: DC | PRN

## 2017-11-11 MED ORDER — DEXAMETHASONE SODIUM PHOSPHATE 10 MG/ML IJ SOLN
INTRAMUSCULAR | Status: AC
  Filled 2017-11-11: qty 1

## 2017-11-11 MED ORDER — SUCCINYLCHOLINE CHLORIDE 200 MG/10ML IV SOSY
PREFILLED_SYRINGE | INTRAVENOUS | Status: AC
Start: 2017-11-11 — End: 2017-11-11
  Filled 2017-11-11: qty 10

## 2017-11-11 MED ORDER — SODIUM CHLORIDE 0.9 % IV SOLN
INTRAVENOUS | Status: DC
  Administered 2017-11-11 – 2017-11-12 (×2): via INTRAVENOUS

## 2017-11-11 MED ORDER — MORPHINE SULFATE (PF) 2 MG/ML IV SOLN: 2.0000 mg | INTRAVENOUS | Status: DC | PRN

## 2017-11-11 MED ORDER — EPHEDRINE 5 MG/ML INJ
INTRAVENOUS | Status: AC
  Filled 2017-11-11: qty 10

## 2017-11-11 MED ORDER — PROTAMINE SULFATE 10 MG/ML IV SOLN
INTRAVENOUS | Status: DC | PRN
  Administered 2017-11-11: 5 mg via INTRAVENOUS
  Administered 2017-11-11: 10 mg via INTRAVENOUS
  Administered 2017-11-11: 25 mg via INTRAVENOUS
  Administered 2017-11-11: 10 mg via INTRAVENOUS

## 2017-11-11 MED ORDER — LABETALOL HCL 5 MG/ML IV SOLN: 10.0000 mg | INTRAVENOUS | Status: DC | PRN

## 2017-11-11 MED ORDER — HYDROMORPHONE HCL 1 MG/ML IJ SOLN
0.2500 mg | INTRAMUSCULAR | Status: DC | PRN
  Administered 2017-11-11 (×4): 0.5 mg via INTRAVENOUS

## 2017-11-11 MED ORDER — ROCURONIUM BROMIDE 100 MG/10ML IV SOLN
INTRAVENOUS | Status: DC | PRN
  Administered 2017-11-11 (×2): 10 mg via INTRAVENOUS
  Administered 2017-11-11: 50 mg via INTRAVENOUS

## 2017-11-11 MED ORDER — METOPROLOL TARTRATE 12.5 MG HALF TABLET
12.5000 mg | ORAL_TABLET | Freq: Two times a day (BID) | ORAL | Status: DC
  Administered 2017-11-11 – 2017-11-13 (×4): 12.5 mg via ORAL
  Filled 2017-11-11 (×4): qty 1

## 2017-11-11 MED ORDER — DEXAMETHASONE SODIUM PHOSPHATE 4 MG/ML IJ SOLN
INTRAMUSCULAR | Status: DC | PRN
  Administered 2017-11-11: 8 mg via INTRAVENOUS

## 2017-11-11 MED ORDER — HYDROMORPHONE HCL 1 MG/ML IJ SOLN
INTRAMUSCULAR | Status: AC
  Administered 2017-11-11: 0.5 mg via INTRAVENOUS
  Filled 2017-11-11: qty 1

## 2017-11-11 MED ORDER — ACETAMINOPHEN 650 MG RE SUPP: 325.0000 mg | RECTAL | Status: DC | PRN

## 2017-11-11 MED ORDER — SUGAMMADEX SODIUM 200 MG/2ML IV SOLN
INTRAVENOUS | Status: AC
  Filled 2017-11-11: qty 2

## 2017-11-11 MED ORDER — VANCOMYCIN HCL IN DEXTROSE 1-5 GM/200ML-% IV SOLN
INTRAVENOUS | Status: AC
  Filled 2017-11-11: qty 200

## 2017-11-11 MED ORDER — METOPROLOL TARTRATE 5 MG/5ML IV SOLN: 2.0000 mg | INTRAVENOUS | Status: DC | PRN

## 2017-11-11 MED ORDER — SODIUM CHLORIDE 0.9 % IV SOLN: 500.0000 mL | Freq: Once | INTRAVENOUS | Status: DC | PRN

## 2017-11-11 MED ORDER — FENTANYL CITRATE (PF) 100 MCG/2ML IJ SOLN
INTRAMUSCULAR | Status: DC | PRN
  Administered 2017-11-11 (×2): 50 ug via INTRAVENOUS
  Administered 2017-11-11: 100 ug via INTRAVENOUS
  Administered 2017-11-11 (×3): 50 ug via INTRAVENOUS

## 2017-11-11 MED ORDER — MIDAZOLAM HCL 2 MG/2ML IJ SOLN
INTRAMUSCULAR | Status: AC
  Filled 2017-11-11: qty 2

## 2017-11-11 MED ORDER — DOCUSATE SODIUM 100 MG PO CAPS
100.0000 mg | ORAL_CAPSULE | Freq: Every day | ORAL | Status: DC
  Administered 2017-11-12: 100 mg via ORAL
  Filled 2017-11-11 (×2): qty 1

## 2017-11-11 MED ORDER — HEPARIN SODIUM (PORCINE) 5000 UNIT/ML IJ SOLN
5000.0000 [IU] | Freq: Three times a day (TID) | INTRAMUSCULAR | Status: DC
  Administered 2017-11-12 – 2017-11-13 (×4): 5000 [IU] via SUBCUTANEOUS
  Filled 2017-11-11 (×4): qty 1

## 2017-11-11 MED ORDER — VANCOMYCIN HCL IN DEXTROSE 1-5 GM/200ML-% IV SOLN
1000.0000 mg | Freq: Two times a day (BID) | INTRAVENOUS | Status: AC
  Administered 2017-11-11 – 2017-11-12 (×2): 1000 mg via INTRAVENOUS
  Filled 2017-11-11 (×2): qty 200

## 2017-11-11 MED ORDER — HYDRALAZINE HCL 20 MG/ML IJ SOLN: 5.0000 mg | INTRAMUSCULAR | Status: DC | PRN

## 2017-11-11 MED ORDER — PROMETHAZINE HCL 25 MG/ML IJ SOLN
INTRAMUSCULAR | Status: AC
  Administered 2017-11-11: 6.25 mg via INTRAVENOUS
  Filled 2017-11-11: qty 1

## 2017-11-11 MED ORDER — LIDOCAINE 2% (20 MG/ML) 5 ML SYRINGE
INTRAMUSCULAR | Status: DC | PRN
  Administered 2017-11-11: 60 mg via INTRAVENOUS
  Administered 2017-11-11: 40 mg via INTRAVENOUS

## 2017-11-11 MED ORDER — ALUM & MAG HYDROXIDE-SIMETH 200-200-20 MG/5ML PO SUSP: 15.0000 mL | ORAL | Status: DC | PRN

## 2017-11-11 MED ORDER — PHENYLEPHRINE 40 MCG/ML (10ML) SYRINGE FOR IV PUSH (FOR BLOOD PRESSURE SUPPORT)
PREFILLED_SYRINGE | INTRAVENOUS | Status: AC
  Filled 2017-11-11: qty 10

## 2017-11-11 MED ORDER — ONDANSETRON HCL 4 MG/2ML IJ SOLN
INTRAMUSCULAR | Status: DC | PRN
  Administered 2017-11-11: 4 mg via INTRAVENOUS

## 2017-11-11 MED ORDER — SUGAMMADEX SODIUM 200 MG/2ML IV SOLN
INTRAVENOUS | Status: DC | PRN
  Administered 2017-11-11: 200 mg via INTRAVENOUS

## 2017-11-11 MED ORDER — 0.9 % SODIUM CHLORIDE (POUR BTL) OPTIME
TOPICAL | Status: DC | PRN
  Administered 2017-11-11: 2000 mL

## 2017-11-11 MED ORDER — ACETAMINOPHEN 325 MG PO TABS: 325.0000 mg | ORAL_TABLET | ORAL | Status: DC | PRN

## 2017-11-11 MED ORDER — MAGNESIUM OXIDE 400 (241.3 MG) MG PO TABS
400.0000 mg | ORAL_TABLET | Freq: Every day | ORAL | Status: DC
  Administered 2017-11-11 – 2017-11-13 (×3): 400 mg via ORAL
  Filled 2017-11-11 (×3): qty 1

## 2017-11-11 MED ORDER — PHENYLEPHRINE HCL 10 MG/ML IJ SOLN
INTRAVENOUS | Status: DC | PRN
  Administered 2017-11-11: 50 ug/min via INTRAVENOUS

## 2017-11-11 SURGICAL SUPPLY — 54 items
BANDAGE ESMARK 6X9 LF (GAUZE/BANDAGES/DRESSINGS) IMPLANT
BNDG ESMARK 6X9 LF (GAUZE/BANDAGES/DRESSINGS)
CANISTER SUCT 3000ML PPV (MISCELLANEOUS) ×2 IMPLANT
CANNULA VESSEL 3MM 2 BLNT TIP (CANNULA) ×4 IMPLANT
CLIP VESOCCLUDE MED 24/CT (CLIP) ×2 IMPLANT
CLIP VESOCCLUDE SM WIDE 24/CT (CLIP) ×2 IMPLANT
CONT SPEC STER OR (MISCELLANEOUS) ×2 IMPLANT
CUFF TOURNIQUET SINGLE 24IN (TOURNIQUET CUFF) IMPLANT
CUFF TOURNIQUET SINGLE 34IN LL (TOURNIQUET CUFF) IMPLANT
CUFF TOURNIQUET SINGLE 44IN (TOURNIQUET CUFF) IMPLANT
DERMABOND ADVANCED (GAUZE/BANDAGES/DRESSINGS) ×1
DERMABOND ADVANCED .7 DNX12 (GAUZE/BANDAGES/DRESSINGS) ×1 IMPLANT
DRAIN SNY WOU (WOUND CARE) IMPLANT
DRAPE HALF SHEET 40X57 (DRAPES) IMPLANT
DRAPE X-RAY CASS 24X20 (DRAPES) IMPLANT
ELECT REM PT RETURN 9FT ADLT (ELECTROSURGICAL) ×2
ELECTRODE REM PT RTRN 9FT ADLT (ELECTROSURGICAL) ×1 IMPLANT
EVACUATOR SILICONE 100CC (DRAIN) IMPLANT
GLOVE BIO SURGEON STRL SZ 6.5 (GLOVE) ×4 IMPLANT
GLOVE BIO SURGEON STRL SZ7.5 (GLOVE) ×2 IMPLANT
GLOVE BIOGEL PI IND STRL 6.5 (GLOVE) ×2 IMPLANT
GLOVE BIOGEL PI INDICATOR 6.5 (GLOVE) ×2
GLOVE ECLIPSE 6.5 STRL STRAW (GLOVE) ×2 IMPLANT
GOWN STRL REUS W/ TWL LRG LVL3 (GOWN DISPOSABLE) ×4 IMPLANT
GOWN STRL REUS W/TWL LRG LVL3 (GOWN DISPOSABLE) ×4
HEMOSTAT SPONGE AVITENE ULTRA (HEMOSTASIS) IMPLANT
KIT BASIN OR (CUSTOM PROCEDURE TRAY) ×2 IMPLANT
KIT ROOM TURNOVER OR (KITS) ×2 IMPLANT
LOOP VESSEL MINI RED (MISCELLANEOUS) ×4 IMPLANT
NS IRRIG 1000ML POUR BTL (IV SOLUTION) ×4 IMPLANT
PACK PERIPHERAL VASCULAR (CUSTOM PROCEDURE TRAY) ×2 IMPLANT
PAD ARMBOARD 7.5X6 YLW CONV (MISCELLANEOUS) ×4 IMPLANT
PATCH HEMASHIELD 8X75 (Vascular Products) ×2 IMPLANT
PENCIL BUTTON HOLSTER BLD 10FT (ELECTRODE) ×2 IMPLANT
SET COLLECT BLD 21X3/4 12 (NEEDLE) IMPLANT
STOPCOCK 4 WAY LG BORE MALE ST (IV SETS) IMPLANT
SUT PROLENE 5 0 C 1 24 (SUTURE) ×2 IMPLANT
SUT PROLENE 6 0 CC (SUTURE) ×6 IMPLANT
SUT PROLENE 7 0 BV 1 (SUTURE) IMPLANT
SUT PROLENE 7 0 BV1 MDA (SUTURE) ×2 IMPLANT
SUT SILK 2 0 SH (SUTURE) IMPLANT
SUT SILK 3 0 (SUTURE)
SUT SILK 3-0 18XBRD TIE 12 (SUTURE) IMPLANT
SUT VIC AB 2-0 SH 27 (SUTURE) ×2
SUT VIC AB 2-0 SH 27XBRD (SUTURE) ×2 IMPLANT
SUT VIC AB 3-0 SH 27 (SUTURE) ×4
SUT VIC AB 3-0 SH 27X BRD (SUTURE) ×4 IMPLANT
SUT VIC AB 4-0 PS2 27 (SUTURE) ×4 IMPLANT
TAPE UMBILICAL COTTON 1/8X30 (MISCELLANEOUS) IMPLANT
TOWEL GREEN STERILE (TOWEL DISPOSABLE) ×2 IMPLANT
TRAY FOLEY W/METER SILVER 16FR (SET/KITS/TRAYS/PACK) ×2 IMPLANT
TUBING EXTENTION W/L.L. (IV SETS) IMPLANT
UNDERPAD 30X30 (UNDERPADS AND DIAPERS) ×2 IMPLANT
WATER STERILE IRR 1000ML POUR (IV SOLUTION) ×2 IMPLANT

## 2017-11-11 NOTE — Discharge Instructions (Signed)
[facility]  Discharge instructions  Lower Extremity Surgery  Please refer to the following instruction for your post-procedure care. Your surgeon or physician assistant will discuss any changes with you.  Activity  You are encouraged to walk as much as you can. You can slowly return to normal activities during the month after your surgery. Avoid strenuous activity and heavy lifting until your doctor tells you it's OK. Avoid activities such as vacuuming or swinging a golf club. Do not drive until your doctor give the OK and you are no longer taking prescription pain medications. It is also normal to have difficulty with sleep habits, eating and bowel movement after surgery. These will go away with time.  Bathing/Showering  You may shower after you go home. Do not soak in a bathtub, hot tub, or swim until the incision heals completely.  Incision Care  Clean your incision with mild soap and water. Shower every day. Pat the area dry with a clean towel. You do not need a bandage unless otherwise instructed. Do not apply any ointments or creams to your incision. If you have open wounds you will be instructed how to care for them or a visiting nurse may be arranged for you. If you have staples or sutures along your incision they will be removed at your post-op appointment. You may have skin glue on your incision. Do not peel it off. It will come off on its own in about one week.   Diet  Resume your normal diet. There are no special food restrictions following this procedure. A low fat/ low cholesterol diet is recommended for all patients with vascular disease. In order to heal from your surgery, it is CRITICAL to get adequate nutrition. Your body requires vitamins, minerals, and protein. Vegetables are the best source of vitamins and minerals. Vegetables also provide the perfect balance of protein. Processed food has little nutritional value, so try to avoid  this.  Medications  Resume taking all your medications unless your doctor or physician assistant tells you not to. If your incision is causing pain, you may take over-the-counter pain relievers such as acetaminophen (Tylenol). If you were prescribed a stronger pain medication, please aware these medication can cause nausea and constipation. Prevent nausea by taking the medication with a snack or meal. Avoid constipation by drinking plenty of fluids and eating foods with high amount of fiber, such as fruits, vegetables, and grains. Take Colace 100 mg (an over-the-counter stool softener) twice a day as needed for constipation. Do not take Tylenol if you are taking prescription pain medications.  Follow Up  Our office will schedule a follow up appointment 2-3 weeks following discharge.  Please call us immediately for any of the following conditions  Severe or worsening pain in your legs or feet while at rest or while walking Increase pain, redness, warmth, or drainage (pus) from your incision site(s) Fever of 101 degree or higher The swelling in your leg with the bypass suddenly worsens and becomes more painful than when you were in the hospital If you have been instructed to feel your graft pulse then you should do so every day. If you can no longer feel this pulse, call the office immediately. Not all patients are given this instruction.  Leg swelling is common after leg bypass surgery.  The swelling should improve over a few months following surgery. To improve the swelling, you may elevate your legs above the level of your heart while you  are sitting or resting. Your surgeon or physician assistant may ask you to apply an ACE wrap or wear compression (TED) stockings to help to reduce swelling.  Reduce your risk of vascular disease  Stop smoking. If you would like help call [facility] at 1-800-QUIT-NOW 202 104 4714) or [place] at (506)412-6190.  Manage your cholesterol Maintain a  desired weight Control your diabetes weight Control your diabetes Keep your blood pressure down  If you have any questions, please call the office at [phone]

## 2017-11-11 NOTE — Anesthesia Procedure Notes (Addendum)
Procedure Name: Intubation Date/Time: 11/11/2017 8:43 AM Performed by: Orlie Dakin, CRNA Pre-anesthesia Checklist: Patient identified, Emergency Drugs available, Suction available, Patient being monitored and Timeout performed Patient Re-evaluated:Patient Re-evaluated prior to induction Oxygen Delivery Method: Circle system utilized Preoxygenation: Pre-oxygenation with 100% oxygen Induction Type: IV induction Ventilation: Mask ventilation without difficulty Laryngoscope Size: Miller and 3 Grade View: Grade I Tube type: Oral Tube size: 7.5 mm Number of attempts: 1 Airway Equipment and Method: Stylet Placement Confirmation: ETT inserted through vocal cords under direct vision,  positive ETCO2 and breath sounds checked- equal and bilateral Secured at: 23 cm Tube secured with: Tape Dental Injury: Teeth and Oropharynx as per pre-operative assessment  Comments: Noted numerous crowns, upper front and molars, top and bottom, none loose per patient report pre-op.  4x4s bite block used.

## 2017-11-11 NOTE — Op Note (Addendum)
Procedure: Right popliteal endarterectomy  Preoperative diagnosis: Claudication  Postoperative diagnosis: Same  Anesthesia: Gen.  Assistant: Leontine Locket PA-C  Operative findings: #1 focal plaque greater than 90% stenosis above-knee popliteal artery  #2 Dacron patch  Operative details: After obtaining informed consent, patient was taken the operating. The patient was placed in supine position on the operating table. After induction of general anesthesia and endotracheal intubation, the patient's entire right lower extremity was prepped and draped in usual sterile fashion. Next a longitudinal incision was made just anterior to the border of the sartorius muscle. The incision was carried through the saphenous tissues down to level of the fascia. The sartorius muscle was reflected posteriorly and the adductors were reflected anteriorly. The incision was deepened down to the above-knee popliteal space. The above-knee popliteal artery was dissected free circumferentially right at the adductor hiatus. 2 cm below this there was a palpable calcified plaque and no pulses below this. Dissected the artery out several centimeters below the plaque in a soft segment. Multiple side branches of the superficial femoral artery were dissected free circumferentially and vessel loops placed around these. Patient was given 10,000 units of intravenous heparin. After 2 minutes of circulation time, the artery was controlled proximally and distally with Henley clamps. Longitudinal opening was made in the popliteal artery just above the area of occlusive disease. The arteriotomy was extended through this segment of plaque. It was heavily calcified. There was greater than 90% stenosis. The arteriotomy was extended passed this. I then performed an endarterectomy of this focal area of plaque. A good proximal endpoint was obtained. There was some slight lifting of the distal endpoints of this was tacked with several 7-0 Prolene  sutures. A Dacron patch was then brought up in the operative field and sewn on as a patch angioplasty using a running 6-0 Prolene suture. Just prior completion of the anastomosis it was forebled backbled and thoroughly flushed. Anastomosis was secured clamps released there was a palpable pulse in the popliteal artery below the anastomosis. The patient had brisk biphasic posterior tibial Doppler flow and a monophasic dorsalis pedis Doppler flow. Hemostasis was obtained with direct pressure and the assistance of 50 mg of protamine. The leg wound was then closed in multiple layers of running 201 3-0 Vicryl suture and 4-0 Vicryl subcuticular stitch in the skin. Dermabond was applied to the skin incision. Patient tolerated the procedure well and there were no complications. Instrument sponge and needle counts correct in the case. The patient was taken to the recovery room in stable condition.  Ruta Hinds, MD [facility] Office: [phone] Pager: [phone]

## 2017-11-11 NOTE — Care Management Note (Deleted)
Case Management Note  Patient Details  Name: RAKESH DUTKO MRN: [id number] Date of Birth: 1946/04/07  Subjective/Objective:    Admitted to [facility] on 12/4, Transfer to 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 PCP noted.   CIR Consulted for placement. NCM will continue to follow for discharge needs.   Expected Discharge Date:                  Expected Discharge Plan:  [employer]  In-House Referral:     Discharge planning Services  CM Consult  Post Acute Care Choice:    Choice offered to:     DME Arranged:    DME Agency:     HH Arranged:    [place] Agency:     Status of Service:  In process, will continue to follow  If discussed at Long Length of Stay Meetings, dates discussed:    Additional Comments:  Kristen Cardinal, RN 11/11/2017, 4:35 PM

## 2017-11-11 NOTE — Transfer of Care (Signed)
Immediate Anesthesia Transfer of Care Note  Patient: Willie Burton  Procedure(s) Performed: RIGHT POPLITEAL ENDARTERECTOMY (Right Leg Upper) PATCH ANGIOPLASTY of Superficial Fenoral Artery (Right Leg Upper)  Patient Location: PACU  Anesthesia Type:General  Level of Consciousness: awake and patient cooperative  Airway & Oxygen Therapy: Patient Spontanous Breathing and Patient connected to nasal cannula oxygen  Post-op Assessment: Report given to RN and Post -op Vital signs reviewed and stable  Post vital signs: Reviewed and stable  Last Vitals:  Vitals:   11/11/17 0656  BP: (!) 125/56  Pulse: (!) 50  Resp: 18  Temp: 36.4 C  SpO2: 99%    Last Pain:  Vitals:   11/11/17 0656  TempSrc: Oral      Patients Stated Pain Goal: 3 (50/15/86 8257)  Complications: No apparent anesthesia complications

## 2017-11-11 NOTE — H&P (Signed)
Referring Physician: Dr [name]  Patient name: Willie Burton        MRN: [id number]        DOB: 1946/06/01          Sex: male  REASON FOR CONSULT: Right calf pain with ambulation  HPI: Willie Burton is a 71 y.o. male with a 9 month history of pain in the right calf with walking. He states that prior to 9 months ago he could walk as much as he wished without any problems. Now at about one quarter of a mile he has tight pain in his right calf which radiates to the popliteal area which is relieved by several minutes of rest. The walking distance is very consistent. He denies rest pain. He has no nonhealing wounds. Other medical problems include coronary artery disease with prior vein harvest from both legs, hypertension, elevated cholesterol all of which are stable. He has no symptoms in the left leg. 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 He states this was benign. He is a former smoker but quit about 40 years ago.  He does have a history of carotid occlusive disease and has seen Dr. Cristal Generous in the past. Right carotid that time was 50% left was 50-70% this was in June 2018. He denies any family history of abdominal aortic aneurysm. He is on aspirin and statin. He had a fairly extensive neurologic workup including an MRI of the leg MRI of the cervical and lumbar spine all of which were really unremarkable.  He also had an EMG which was unremarkable.      Past Medical History:  Diagnosis Date  . 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 Arthritis   . Diverticula of colon   . Heart disease   . High blood pressure   . High cholesterol   . Hyperlipemia   . Prostate cancer [facility])         Past Surgical History:  Procedure Laterality Date  . APPENDECTOMY    . CATARACT EXTRACTION    . COLON SURGERY    . CORONARY ARTERY BYPASS GRAFT     x 3  . LIPOMA EXCISION Right    thigh  . METACARPOPHALANGEAL JOINT  ARTHROPLASTY    . ULNAR NERVE TRANSPOSITION Left January 2015         Family History  Problem Relation Age of Onset  . COPD Mother        Deceased, 32  . Throat cancer Father        Deceased, 64s  . Healthy Brother   . Healthy Daughter   . Healthy Son     SOCIAL HISTORY: Social History        Socioeconomic History  . Marital status: Married    Spouse name: Not on file  . Number of children: 2  . Years of education: Masters  . Highest education level: Not on file  Social Needs  . Financial resource strain: Not on file  . Food insecurity - worry: Not on file  . Food insecurity - inability: Not on file  . Transportation needs - medical: Not on file  . Transportation needs - non-medical: Not on file  Occupational History  . Occupation: retired  Tobacco Use  . Smoking status: Former Smoker    Packs/day: 1.50    Years: 5.00    Pack years: 7.50    Types: Cigarettes  .  Smokeless tobacco: Never Used  . Tobacco comment: Quit 40+ years ago  Substance and Sexual Activity  . Alcohol use: No    Alcohol/week: 0.0 oz  . Drug use: No  . Sexual activity: Not on file  Other Topics Concern  . Not on file  Social History Narrative   He lives with his wife.   Retired Chief Financial Officer.   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   1-2 cups caffeine per day.        Allergies  Allergen Reactions  . Hepatitis A Antigen   . Typhoid Vaccines   . Amoxicillin Rash          Current Outpatient Medications  Medication Sig Dispense Refill  . aspirin EC 81 MG tablet Take by mouth daily.     . magnesium oxide (MAG-OX) 400 MG tablet Take by mouth.    . metoprolol tartrate (LOPRESSOR) 25 MG tablet Take by mouth.    . rosuvastatin (CRESTOR) 5 MG tablet Take by mouth.    . vitamin B-12 (CYANOCOBALAMIN) 1000 MCG tablet Take 1,000 mcg by mouth daily. Reported on 12/31/2015    . gabapentin (NEURONTIN) 300 MG capsule Take 1  capsule (300 mg total) by mouth 3 (three) times daily. (Patient not taking: Reported on 10/07/2017) 90 capsule 11   No current facility-administered medications for this visit.     ROS:   General:  No weight loss, Fever, chills  HEENT: No recent headaches, no nasal bleeding, no visual changes, no sore throat  Neurologic: No dizziness, blackouts, seizures. No recent symptoms of stroke or mini- stroke. No recent episodes of slurred speech, or temporary blindness.  Cardiac: No recent episodes of chest pain/pressure, no shortness of breath at rest.  No shortness of breath with exertion.  Denies history of atrial fibrillation or irregular heartbeat  Vascular: No history of rest pain in feet.  No history of claudication.  No history of non-healing ulcer, No history of DVT   Pulmonary: No home oxygen, no productive cough, no hemoptysis,  No asthma or wheezing  Musculoskeletal:  [X]  Arthritis, [X]  Low back pain,  [X]  Joint pain  Hematologic:No history of hypercoagulable state.  No history of easy bleeding.  No history of anemia  Gastrointestinal: No hematochezia or melena,  No gastroesophageal reflux, no trouble swallowing  Urinary: [ ]  chronic Kidney disease, [ ]  on HD - [ ]  MWF or [ ]  TTHS, [ ]  Burning with urination, [ ]  Frequent urination, [ ]  Difficulty urinating;   Skin: No rashes  Psychological: No history of anxiety,  No history of depression   Physical Examination   Vitals:   11/11/17 0656 11/11/17 0657  BP: (!) 125/56   Pulse: (!) 50   Resp: 18   Temp: 97.6 F (36.4 C)   TempSrc: Oral   SpO2: 99%   Weight:  215 lb (97.5 kg)  Height:  5\' 9"  (1.753 m)     General:  Alert and oriented, no acute distress HEENT: Normal Neck: No bruit or JVD Pulmonary: Clear to auscultation bilaterally Cardiac: Regular Rate and Rhythm without murmur Abdomen: Soft, non-tender, non-distended, no mass Skin: No rash Extremity Pulses:  2+ radial, brachial, femoral,  dorsalis pedis, posterior tibial pulses bilaterally Musculoskeletal: No deformity or edema      Neurologic: Upper and lower extremity motor 5/5 and symmetric  DATA:  I reviewed the patient's recent ABIs from [facility] which were greater than 1 triphasic normal bilaterally. Patient also  had a venous duplex exam which showed no evidence of DVT.  Agram shows right SFA lesion  ASSESSMENT:  Patient with normal noninvasive arterial and venous exam and fairly benign appearing extensive neurologic workup. He has pain in his calf which by history certainly sounds like claudication. Right SFA lesion on angio   PLAN:  Right femoral endarterectomy vs fem pop   Ruta Hinds, MD [facility] Office: [phone] Pager: [phone]

## 2017-11-11 NOTE — Anesthesia Postprocedure Evaluation (Signed)
Anesthesia Post Note  Patient: Willie Burton  Procedure(s) Performed: RIGHT POPLITEAL ENDARTERECTOMY (Right Leg Upper) PATCH ANGIOPLASTY of Superficial Fenoral Artery (Right Leg Upper)     Patient location during evaluation: PACU Anesthesia Type: General Level of consciousness: awake and alert Pain management: pain level controlled Vital Signs Assessment: post-procedure vital signs reviewed and stable Respiratory status: spontaneous breathing, nonlabored ventilation, respiratory function stable and patient connected to nasal cannula oxygen Cardiovascular status: blood pressure returned to baseline and stable Postop Assessment: no apparent nausea or vomiting Anesthetic complications: no    Last Vitals:  Vitals:   11/11/17 1700 11/11/17 1742  BP:    Pulse:    Resp: 15 14  Temp:    SpO2: 100% 97%    Last Pain:  Vitals:   11/11/17 1650  TempSrc: Axillary  PainSc:                  Karyl Kinnier Cipriana Biller

## 2017-11-12 ENCOUNTER — Encounter (HOSPITAL_COMMUNITY): Payer: Self-pay | Admitting: Vascular Surgery

## 2017-11-12 ENCOUNTER — Inpatient Hospital Stay (HOSPITAL_COMMUNITY): Payer: Medicare Other

## 2017-11-12 DIAGNOSIS — I739 Peripheral vascular disease, unspecified: Secondary | ICD-10-CM

## 2017-11-12 LAB — BASIC METABOLIC PANEL
Anion gap: 5 (ref 5–15)
BUN: 14 mg/dL (ref 6–20)
CHLORIDE: 106 mmol/L (ref 101–111)
CO2: 25 mmol/L (ref 22–32)
Calcium: 8.4 mg/dL — ABNORMAL LOW (ref 8.9–10.3)
Creatinine, Ser: 0.84 mg/dL (ref 0.61–1.24)
GFR calc Af Amer: >60 mL/min (ref >60)
GFR calc non Af Amer: >60 mL/min (ref >60)
Glucose, Bld: 122 mg/dL — ABNORMAL HIGH (ref 65–99)
POTASSIUM: 4.2 mmol/L (ref 3.5–5.1)
Sodium: 136 mmol/L (ref 135–145)

## 2017-11-12 LAB — CBC
HEMATOCRIT: 38.7 % — AB (ref 39.0–52.0)
Hemoglobin: 13.1 g/dL (ref 13.0–17.0)
MCH: 31.5 pg (ref 26.0–34.0)
MCHC: 33.9 g/dL (ref 30.0–36.0)
MCV: 93 fL (ref 78.0–100.0)
PLATELETS: 177 10*3/uL (ref 150–400)
RBC: 4.16 MIL/uL — AB (ref 4.22–5.81)
RDW: 13 % (ref 11.5–15.5)
WBC: 8.8 10*3/uL (ref 4.0–10.5)

## 2017-11-12 MED ORDER — OXYCODONE-ACETAMINOPHEN 5-325 MG PO TABS: 1.0000 | ORAL_TABLET | Freq: Four times a day (QID) | ORAL | 0 refills | Status: DC | PRN

## 2017-11-12 NOTE — Progress Notes (Signed)
VASCULAR LAB PRELIMINARY  ARTERIAL  ABI completed:Bilateral ABIs are within normal limits.     RIGHT    LEFT    PRESSURE WAVEFORM  PRESSURE WAVEFORM  BRACHIAL 124 T BRACHIAL 128 T  DP   DP    AT 172 B AT 145 B  PT 168 T PT 160 T  PER   PER    GREAT TOE  NA GREAT TOE  NA    RIGHT LEFT  ABI 1.3 1.26     Valor [place], RVT 11/12/2017, 12:02 PM

## 2017-11-12 NOTE — Progress Notes (Signed)
VASCULAR LAB PRELIMINARY  ARTERIAL  ABI completed:Bilateral ABIs are within normal limits.    RIGHT    LEFT    PRESSURE WAVEFORM  PRESSURE WAVEFORM  BRACHIAL 124 T BRACHIAL 128 T  DP   DP    AT 172 B AT 145 B  PT 168 B PT 160 T  PER   PER    GREAT TOE  NA GREAT TOE  NA    RIGHT LEFT  ABI 1.34 1.26     Orvin Netter, RVT 11/12/2017, 12:00 PM

## 2017-11-12 NOTE — Evaluation (Signed)
Occupational Therapy Evaluation Patient Details Name: Willie Burton MRN: [id number] DOB: 10-Jun-1946 Today's Date: 11/12/2017    History of Present Illness Pt is a 71 y.o. male s/p RLE femoral-popliteal bypass graft on 11/11/17. Pertinent PMH includes PVD, dysrhytmia, HTN.    Clinical Impression   Pt requires min A for LB ADLs (mostly due to pain an decreased ROM of R LE) and is independent with ADL mobility. Pt's wife will provide 24/7 assist prn. All education completed and no further acute OT is indicated at this time  Follow Up Recommendations  No OT follow up;Supervision - Intermittent    Equipment Recommendations   reacher   Recommendations for Other Services       Precautions / Restrictions Precautions Precautions: None Restrictions Weight Bearing Restrictions: No      Mobility Bed Mobility Overal bed mobility: Independent             General bed mobility comments: pt up finishing with PT upon arrival  Transfers Overall transfer level: Independent Equipment used: None                  Balance Overall balance assessment: Needs assistance   Sitting balance-Leahy Scale: Good     Standing balance support: During functional activity Standing balance-Leahy Scale: Good                             ADL either performed or assessed with clinical judgement   ADL Overall ADL's : Needs assistance/impaired Eating/Feeding: Independent;Sitting   Grooming: Wash/dry hands;Wash/dry face;Standing;Supervision/safety   Upper Body Bathing: Independent;Sitting   Lower Body Bathing: Minimal assistance;With caregiver independent assisting   Upper Body Dressing : Independent;Sitting   Lower Body Dressing: Minimal assistance;With caregiver independent assisting   Toilet Transfer: Independent;Ambulation;Regular Toilet   Toileting- Water quality scientist and Hygiene: Min guard   Tub/ Banker: With caregiver independent assisting;Modified  independent   Functional mobility during ADLs: Independent;Modified independent General ADL Comments: pt amd wife educated on ADL A/E for home use     Vision Baseline Vision/History: Wears glasses Wears Glasses: At all times Patient Visual Report: No change from baseline       Perception     Praxis      Pertinent Vitals/Pain Pain Assessment: 0-10 Pain Score: 4  Faces Pain Scale: Hurts little more Pain Location: R LE Pain Descriptors / Indicators: Sore Pain Intervention(s): Monitored during session;Repositioned;Relaxation     Hand Dominance Right   Extremity/Trunk Assessment Upper Extremity Assessment Upper Extremity Assessment: Overall WFL for tasks assessed   Lower Extremity Assessment Lower Extremity Assessment: Defer to PT evaluation RLE Deficits / Details: R knee grossly 3/5 secondary to pain; hip flex 4/5   Cervical / Trunk Assessment Cervical / Trunk Assessment: Normal   Communication Communication Communication: No difficulties   Cognition Arousal/Alertness: Awake/alert Behavior During Therapy: WFL for tasks assessed/performed Overall Cognitive Status: Within Functional Limits for tasks assessed                                     General Comments       Exercises     Shoulder Instructions      Home Living Family/patient expects to be discharged to:: Private residence Living Arrangements: Spouse/significant other Available Help at Discharge: Family;Available 24 hours/day Type of Home: House Home Access: Stairs to enter [employer] of Steps: 2 Entrance Stairs-Rails:  None Home Layout: Two level;Bed/bath upstairs Alternate Level Stairs-Number of Steps: Flight Alternate Level Stairs-Rails: Right Bathroom Shower/Tub: Occupational psychologist: Standard     Home Equipment: Environmental consultant - 2 wheels;Cane - single point          Prior Functioning/Environment Level of Independence: Independent                  OT Problem List: Decreased activity tolerance;Pain;Decreased knowledge of use of DME or AE      OT Treatment/Interventions:      OT Goals(Current goals can be found in the care plan section) Acute Rehab OT Goals Patient Stated Goal: go home OT Goal Formulation: With patient  OT Frequency:     Barriers to D/C:    na barriers       Co-evaluation              AM-PAC PT "6 Clicks" Daily Activity     Outcome Measure Help from another person eating meals?: None Help from another person taking care of personal grooming?: A Little Help from another person toileting, which includes using toliet, bedpan, or urinal?: A Little Help from another person bathing (including washing, rinsing, drying)?: A Little Help from another person to put on and taking off regular upper body clothing?: None Help from another person to put on and taking off regular lower body clothing?: A Little 6 Click Score: 20   End of Session Equipment Utilized During Treatment: Gait belt  Activity Tolerance: Patient tolerated treatment well Patient left: in chair;with call bell/phone within reach;with family/visitor present  OT Visit Diagnosis: Pain;Other abnormalities of gait and mobility (R26.89) Pain - Right/Left: Right Pain - part of body: Leg                Time: 1916-6060 OT Time Calculation (min): 15 min Charges:  OT General Charges $OT Visit: 1 Visit OT Evaluation $OT Eval Low Complexity: 1 Low G-Codes: OT G-codes **NOT FOR INPATIENT CLASS** Functional Assessment Tool Used: AM-PAC 6 Clicks Daily Activity     Willie Burton 11/12/2017, 12:18 PM

## 2017-11-12 NOTE — Progress Notes (Signed)
[facility]  Subjective  - leg sore   Objective (!) 118/59 (!) 56 98.3 F (36.8 C) (Oral) 18 97%  Intake/Output Summary (Last 24 hours) at 11/12/2017 0819 Last data filed at 11/12/2017 0813 Gross per 24 hour  Intake 1832.5 ml  Output 2275 ml  Net -442.5 ml   Incision clean 1+ PT DP  Assessment/Planning: Ambulate today D/c home tomorrow    Ruta Hinds 11/12/2017 8:19 AM --  Laboratory Lab Results: Recent Labs    11/11/17 1346 11/12/17 0258  WBC 9.7 8.8  HGB 14.5 13.1  HCT 42.4 38.7*  PLT 181 177   BMET Recent Labs    11/09/17 1002 11/11/17 1346 11/12/17 0258  NA 136  --  136  K 3.9  --  4.2  CL 104  --  106  CO2 25  --  25  GLUCOSE 74  --  122*  BUN 14  --  14  CREATININE 0.96 0.85 0.84  CALCIUM 9.2  --  8.4*    COAG Lab Results  Component Value Date   INR 0.94 11/09/2017   No results found for: PTT

## 2017-11-12 NOTE — Progress Notes (Signed)
  Progress Note    11/12/2017 8:19 AM 1 Day Post-Op  Subjective:  Says he is sore at the incision.  Had trouble standing yesterday bc of pain  Afebrile HR 50's-70's NSR 983'[id number]-825'K systolic 53% RA  Vitals:   11/12/17 0324 11/12/17 0812  BP: 136/66 (!) 118/59  Pulse:  (!) 56  Resp: (!) 8 18  Temp: 97.7 F (36.5 C) 98.3 F (36.8 C)  SpO2: 98% 97%    Physical Exam: Cardiac:  regular Lungs:   Non labored Incisions:  Clean and dry without hematoma Extremities:  +palpable DP pulse right  CBC    Component Value Date/Time   WBC 8.8 11/12/2017 0258   RBC 4.16 (L) 11/12/2017 0258   HGB 13.1 11/12/2017 0258   HCT 38.7 (L) 11/12/2017 0258   PLT 177 11/12/2017 0258   MCV 93.0 11/12/2017 0258   MCH 31.5 11/12/2017 0258   MCHC 33.9 11/12/2017 0258   RDW 13.0 11/12/2017 0258    BMET    Component Value Date/Time   NA 136 11/12/2017 0258   K 4.2 11/12/2017 0258   CL 106 11/12/2017 0258   CO2 25 11/12/2017 0258   GLUCOSE 122 (H) 11/12/2017 0258   BUN 14 11/12/2017 0258   CREATININE 0.84 11/12/2017 0258   CREATININE 0.91 11/10/2014 0845   CALCIUM 8.4 (L) 11/12/2017 0258   GFRNONAA >60 11/12/2017 0258   GFRNONAA >60 11/10/2014 0845   GFRAA >60 11/12/2017 0258   GFRAA >60 11/10/2014 0845    INR    Component Value Date/Time   INR 0.94 11/09/2017 1002     Intake/Output Summary (Last 24 hours) at 11/12/2017 0819 Last data filed at 11/12/2017 0813 Gross per 24 hour  Intake 1832.5 ml  Output 2275 ml  Net -442.5 ml     Assessment:  71 y.o. male is s/p:  Right popliteal endarterectomy  1 Day Post-Op  Plan: -pt doing well this morning with palpable right DP pulse and incision looks fine -dc foley and oob and ambulate today.  Home when comfortable -DVT prophylaxis:  SQ heparin   Leontine Locket, PA-C [facility] [phone] 11/12/2017 8:19 AM

## 2017-11-12 NOTE — Progress Notes (Signed)
   Called secondary to brady cardia 46-47.  Patient has known brady cardia, normal is 50-60 when active.  I reviewed his HP from the office and his HR was 50 bpm.  He has a stable BP of 120's/60's and HGB of 13.1, UO good.  Asymptomatic no weakness or dizziness.    Stable  Roxy Horseman PA-C

## 2017-11-12 NOTE — Care Management Note (Signed)
Case Management Note  Patient Details  Name: Willie Burton MRN: [id number] Date of Birth: [birth year]  Subjective/Objective:      s/p RLE femoral-popliteal bypass graft on 11/11/17.             Action/Plan:  PTA Pt lives at home with spouse. PCP noted.  Notified by Jonelle Sidle with [facility] that Pt has post-op referral for [facility] needs.  Anticipated discharge on 11/13/17.  NCM will continue to follow for transition discharge needs.  Expected Discharge Date:   11/13/17             Expected Discharge Plan:  Home/Self Care  In-House Referral:  NA  Discharge planning Services  CM Consult  Post Acute Care Choice:    Choice offered to:  NA  DME Arranged:    DME Agency:     HH Arranged:  NA HH Agency:     Status of Service:  In process, will continue to follow  If discussed at Long Length of Stay Meetings, dates discussed:    Additional Comments:  Kristen Cardinal, RN 11/12/2017, 10:30 AM

## 2017-11-12 NOTE — Discharge Summary (Signed)
Discharge Summary     Willie Burton 10-17-1946 71 y.o. male  [id number]  Admission Date: 11/11/2017  Discharge Date: 11/13/2017  Physician: Elam Dutch, MD  Admission Diagnosis: peripheral vascular disease   HPI:   This is a 71 y.o. male with a 9 month history of pain in the right calf with walking. He states that prior to 9 months ago he could walk as much as he wished without any problems. Now at about one quarter of a mile he has tight pain in his right calf which radiates to the popliteal area which is relieved by several minutes of rest. The walking distance is very consistent. He denies rest pain. He has no nonhealing wounds.Other medical problems includecoronary artery disease with prior vein harvest from both legs, hypertension, elevated cholesterol all of which are stable. He has no symptoms in the left leg. 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 He states this was benign. He is a former smoker but quit about 40 years ago.He does have a history of carotid occlusive disease and has seen Dr. Cristal Generous in the past. Right carotid that time was 50% left was 50-70% this was in June 2018. He denies any family history of abdominal aortic aneurysm. He is on aspirin and statin. He had a fairly extensive neurologic workup including an MRI of the leg MRI of the cervical and lumbar spine all of which were really unremarkable.He also had an EMG which was unremarkable.   Hospital Course:  The patient was admitted to the hospital and taken to the operating room on 11/11/2017 and underwent: Right popliteal endarterectomy    The pt tolerated the procedure well and was transported to the PACU in good condition.   By POD 1, pt was doing well with a palpable right DP pulse.  He ambulated with PT and did well.  Increased mobility no PT/OT follow up recommended.  Discharge home in stable condition.  ABI's on 11/12/17:  RIGHT     LEFT    PRESSURE WAVEFORM  PRESSURE WAVEFORM  BRACHIAL 124 T BRACHIAL 128 T  DP   DP    AT 172 B AT 145 B  PT 168 T PT 160 T  PER   PER    GREAT TOE  NA GREAT TOE  NA    RIGHT LEFT  ABI 1.3 1.26     The remainder of the hospital course consisted of increasing mobilization and increasing intake of solids without difficulty.  CBC    Component Value Date/Time   WBC 8.8 11/12/2017 0258   RBC 4.16 (L) 11/12/2017 0258   HGB 13.1 11/12/2017 0258   HCT 38.7 (L) 11/12/2017 0258   PLT 177 11/12/2017 0258   MCV 93.0 11/12/2017 0258   MCH 31.5 11/12/2017 0258   MCHC 33.9 11/12/2017 0258   RDW 13.0 11/12/2017 0258    BMET    Component Value Date/Time   NA 136 11/12/2017 0258   K 4.2 11/12/2017 0258   CL 106 11/12/2017 0258   CO2 25 11/12/2017 0258   GLUCOSE 122 (H) 11/12/2017 0258   BUN 14 11/12/2017 0258   CREATININE 0.84 11/12/2017 0258   CREATININE 0.91 11/10/2014 0845   CALCIUM 8.4 (L) 11/12/2017 0258   GFRNONAA >60 11/12/2017 0258   GFRNONAA >60 11/10/2014 0845   GFRAA >60 11/12/2017 0258   GFRAA >60 11/10/2014 0845       Discharge Diagnosis:  peripheral  vascular disease  Secondary Diagnosis: Patient Active Problem List   Diagnosis Date Noted  . Claudication ([place]) 11/11/2017  . Cramp in limb 09/04/2017  . 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 Chronic neck pain 08/11/2017  . Chronic bilateral low back pain with right-sided sciatica 08/11/2017  . Carotid stenosis 05/05/2017  . H/O coronary artery bypass surgery 11/20/2015  . 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 Ulnar neuropathy of left upper extremity 10/13/2014  . BP (high blood pressure) 04/26/2014  . HLD (hyperlipidemia) 04/26/2014  . AC (acromioclavicular) arthritis 04/04/2013   Past Medical History:  Diagnosis Date  . 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 Arthritis   . Diverticula of colon   . Dysrhythmia     PACs, PVCs  . Heart disease   . High blood pressure   . High cholesterol   . Hyperlipemia   . Peripheral vascular disease ([place])   . Prostate cancer ([place])      Allergies as of 11/12/2017      Reactions   Amoxicillin Hives, Rash   Has patient had a PCN reaction causing immediate rash, facial/tongue/throat swelling, SOB or lightheadedness with hypotension: Yes Has patient had a PCN reaction causing severe rash involving mucus membranes or skin necrosis: No Has patient had a PCN reaction that required hospitalization: No Has patient had a PCN reaction occurring within the last 10 years: No If all of the above answers are "NO", then may proceed with Cephalosporin use.   Hepatitis A Antigen Rash   Typhoid Vaccines Rash      Medication List    TAKE these medications   aspirin EC 81 MG tablet Take 81 mg daily by mouth.   cholecalciferol 1000 units tablet Commonly known as:  VITAMIN D Take 1,000 Units daily by mouth.   MAGNESIUM-OXIDE 400 (241.3 Mg) MG tablet Generic drug:  magnesium oxide Take 400 mg by mouth daily.   metoprolol tartrate 25 MG tablet Commonly known as:  LOPRESSOR Take 12.5 mg 2 (two) times daily by mouth.   oxyCODONE-acetaminophen 5-325 MG tablet Commonly known as:  PERCOCET/ROXICET Take 1 tablet by mouth every 6 (six) hours as needed for moderate pain.   rosuvastatin 5 MG tablet Commonly known as:  CRESTOR Take 5 mg by mouth at bedtime.   vitamin B-12 1000 MCG tablet Commonly known as:  CYANOCOBALAMIN Take 1,000 mcg by mouth daily. Reported on 12/31/2015       Discharge Instructions: [facility] Discharge instructions Lower Extremity Bypass Surgery  Please refer to the following instruction for your post-procedure care. Your surgeon or physician assistant will discuss any changes with you.  Activity  You are encouraged to walk as much as you can. You can slowly return to normal activities during the month after  your surgery. Avoid strenuous activity and heavy lifting until your doctor tells you it's OK. Avoid activities such as vacuuming or swinging a golf club. Do not drive until your doctor give the OK and you are no longer taking prescription pain medications. It is also normal to have difficulty with sleep habits, eating and bowel movement after surgery. These will go away with time.  Bathing/Showering  You may shower after you go home. Do not soak in a bathtub, hot tub, or swim until the incision heals completely.  Incision Care  Clean your incision with mild soap and water. Shower every day. Pat the area dry with  a clean towel. You do not need a bandage unless otherwise instructed. Do not apply any ointments or creams to your incision. If you have open wounds you will be instructed how to care for them or a visiting nurse may be arranged for you. If you have staples or sutures along your incision they will be removed at your post-op appointment. You may have skin glue on your incision. Do not peel it off. It will come off on its own in about one week.  Diet  Resume your normal diet. There are no special food restrictions following this procedure. A low fat/ low cholesterol diet is recommended for all patients with vascular disease. In order to heal from your surgery, it is CRITICAL to get adequate nutrition. Your body requires vitamins, minerals, and protein. Vegetables are the best source of vitamins and minerals. Vegetables also provide the perfect balance of protein. Processed food has little nutritional value, so try to avoid this.  Medications  Resume taking all your medications unless your doctor or Physician Assistant tells you not to. If your incision is causing pain, you may take over-the-counter pain relievers such as acetaminophen (Tylenol). If you were prescribed a stronger pain medication, please aware these medication can cause nausea and constipation. Prevent nausea by taking the  medication with a snack or meal. Avoid constipation by drinking plenty of fluids and eating foods with high amount of fiber, such as fruits, vegetables, and grains. Take Colace 100 mg (an over-the-counter stool softener) twice a day as needed for constipation. Do not take Tylenol if you are taking prescription pain medications.  Follow Up  Our office will schedule a follow up appointment 2-3 weeks following discharge.  Please call us immediately for any of the following conditions  .Severe or worsening pain in your legs or feet while at rest or while walking .Increase pain, redness, warmth, or drainage (pus) from your incision site(s) Fever of 101 degree or higher The swelling in your leg with the bypass suddenly worsens and becomes more painful than when you were in the hospital If you have been instructed to feel your graft pulse then you should do so every day. If you can no longer feel this pulse, call the office immediately. Not all patients are given this instruction.  Leg swelling is common after leg bypass surgery.  The swelling should improve over a few months following surgery. To improve the swelling, you may elevate your legs above the level of your heart while you are sitting or resting. Your surgeon or physician assistant may ask you to apply an ACE wrap or wear compression (TED) stockings to help to reduce swelling.  Reduce your risk of vascular disease  Stop smoking. If you would like help call [facility] at 1-800-QUIT-NOW [phone]) or [place] at [phone].  Manage your cholesterol Maintain a desired weight Control your diabetes weight Control your diabetes Keep your blood pressure down  If you have any questions, please call the office at [phone]   Prescriptions given: 1.  Roxicet #20 No Refill  Disposition: home  Patient's condition: is Good  Follow up: 1. Dr. Oneida Alar in 2 weeks   Leontine Locket, PA-C Roxy Horseman PA-C [facility] [phone] 11/12/2017  9:30 AM  - For VQI Registry use ---   Post-op:  Wound infection: No  Graft infection: No  Transfusion: No    If yes, n/a units given New Arrhythmia: No Ipsilateral amputation: No, [ ]  Minor, [ ]  BKA, [ ]   AKA Discharge patency: [x ] Primary, [ ]  Primary assisted, [ ]  Secondary, [ ]  Occluded Patency judged by: [ ]  Dopper only, [ ]  Palpable graft pulse, [x]  Palpable distal pulse, [ ]  ABI inc. > 0.15, [ ]  Duplex Discharge ABI: R 1.3, L 1.26 D/C Ambulatory Status: Ambulatory  Complications: MI: No, [ ]  Troponin only, [ ]  EKG or Clinical CHF: No Resp failure:No, [ ]  Pneumonia, [ ]  Ventilator Chg in renal function: No, [ ]  Inc. Cr > 0.5, [ ]  Temp. Dialysis,  [ ]  Permanent dialysis Stroke: No, [ ]  Minor, [ ]  Major Return to OR: No  Reason for return to OR: [ ]  Bleeding, [ ]  Infection, [ ]  Thrombosis, [ ]  Revision  Discharge medications: Statin use:  yes ASA use:  yes Plavix use:  no Beta blocker use: yes CCB use:  No ACEI use:   no ARB use:  no Coumadin use: no

## 2017-11-12 NOTE — Evaluation (Signed)
Physical Therapy Evaluation Patient Details Name: Willie Burton MRN: [id number] DOB: 1946-10-20 Today's Date: 11/12/2017   History of Present Illness  Pt is a 71 y.o. male s/p RLE femoral-popliteal bypass graft on 11/11/17. Pertinent PMH includes PVD, dysrhytmia, HTN.     Clinical Impression  Patient evaluated by Physical Therapy with no further acute PT needs identified. PTA, pt indep and lives at home with wife. Today, indep for ambulation and supervision for stair training. Will have 24/7 support from wife at home. Educ on importance of knee ROM, positioning, fall risk reduction, and continued mobility. All education has been completed and the patient has no further questions. PT is signing off. Thank you for this referral.    Follow Up Recommendations No PT follow up;Supervision - Intermittent    Equipment Recommendations  None recommended by PT    Recommendations for Other Services       Precautions / Restrictions Precautions Precautions: None Restrictions Weight Bearing Restrictions: No      Mobility  Bed Mobility Overal bed mobility: Independent                Transfers Overall transfer level: Independent Equipment used: None                Ambulation/Gait Ambulation/Gait assistance: Independent Ambulation Distance (Feet): 300 Feet Assistive device: None Gait Pattern/deviations: Step-through pattern;Decreased stride length;Decreased weight shift to right;Antalgic Gait velocity: Decreased Gait velocity interpretation: <1.8 ft/sec, indicative of risk for recurrent falls General Gait Details: Started with RW, but quickly switched to no AD as pt demonstrating good stability on RLE. Able to achieve heel-to-toe gait pattern and increased knee flexion with cues  Stairs Stairs: Yes Stairs assistance: Supervision Stair Management: One rail Right;Sideways;Forwards;Step to pattern Number of Stairs: 6 General stair comments: Ascend/descended 6 steps with  BUE support on R-rail. Educ on technique. Supervision for safety  Wheelchair Mobility    Modified Rankin (Stroke Patients Only)       Balance Overall balance assessment: Needs assistance   Sitting balance-Leahy Scale: Good       Standing balance-Leahy Scale: Good                               Pertinent Vitals/Pain Pain Assessment: Faces Faces Pain Scale: Hurts little more Pain Location: RLE Pain Descriptors / Indicators: Sore Pain Intervention(s): Monitored during session    Home Living Family/patient expects to be discharged to:: Private residence Living Arrangements: Spouse/significant other Available Help at Discharge: Family;Available 24 hours/day Type of Home: House Home Access: Stairs to enter Entrance Stairs-Rails: None Entrance Stairs-Number of Steps: 2 Home Layout: Two level;Bed/bath upstairs Home Equipment: Walker - 2 wheels;Cane - single point      Prior Function Level of Independence: Independent               Hand Dominance        Extremity/Trunk Assessment   Upper Extremity Assessment Upper Extremity Assessment: Overall WFL for tasks assessed    Lower Extremity Assessment Lower Extremity Assessment: RLE deficits/detail RLE Deficits / Details: R knee grossly 3/5 secondary to pain; hip flex 4/5    Cervical / Trunk Assessment Cervical / Trunk Assessment: Normal  Communication   Communication: No difficulties  Cognition Arousal/Alertness: Awake/alert Behavior During Therapy: WFL for tasks assessed/performed Overall Cognitive Status: Within Functional Limits for tasks assessed  General Comments      Exercises     Assessment/Plan    PT Assessment Patent does not need any further PT services  PT Problem List         PT Treatment Interventions      PT Goals (Current goals can be found in the Care Plan section)  Acute Rehab PT Goals PT Goal Formulation: All  assessment and education complete, DC therapy    Frequency     Barriers to discharge        Co-evaluation               AM-PAC PT "6 Clicks" Daily Activity  Outcome Measure Difficulty turning over in bed (including adjusting bedclothes, sheets and blankets)?: None Difficulty moving from lying on back to sitting on the side of the bed? : None Difficulty sitting down on and standing up from a chair with arms (e.g., wheelchair, bedside commode, etc,.)?: None Help needed moving to and from a bed to chair (including a wheelchair)?: None Help needed walking in hospital room?: None Help needed climbing 3-5 steps with a railing? : A Little 6 Click Score: 23    End of Session Equipment Utilized During Treatment: Gait belt Activity Tolerance: Patient tolerated treatment well Patient left: Other (comment)(walking with OT in hallway) Nurse Communication: Mobility status PT Visit Diagnosis: Other abnormalities of gait and mobility (R26.89);Pain Pain - Right/Left: Right Pain - part of body: Leg    Time: 6314-9702 PT Time Calculation (min) (ACUTE ONLY): 24 min   Charges:   PT Evaluation $PT Eval Low Complexity: 1 Low PT Treatments $Gait Training: 8-22 mins   PT G Codes:   PT G-Codes **NOT FOR INPATIENT CLASS** Functional Assessment Tool Used: AM-PAC 6 Clicks Basic Mobility Functional Limitation: Mobility: Walking and moving around Mobility: Walking and Moving Around Current Status (O3785): At least 1 percent but less than 20 percent impaired, limited or restricted Mobility: Walking and Moving Around Goal Status [phone]): At least 1 percent but less than 20 percent impaired, limited or restricted Mobility: Walking and Moving Around Discharge Status [phone]): At least 1 percent but less than 20 percent impaired, limited or restricted   Mabeline Caras, PT, DPT Acute Rehab Services  Pager: [place] 11/12/2017, 9:42 AM

## 2017-11-13 ENCOUNTER — Encounter (INDEPENDENT_AMBULATORY_CARE_PROVIDER_SITE_OTHER): Payer: Medicare Other

## 2017-11-13 ENCOUNTER — Ambulatory Visit (INDEPENDENT_AMBULATORY_CARE_PROVIDER_SITE_OTHER): Payer: Medicare Other | Admitting: Vascular Surgery

## 2017-11-13 NOTE — Care Management Note (Signed)
Case Management Note  Patient Details  Name: BRAULIO KIEDROWSKI MRN: [id number] Date of Birth: Sep 07, 1946  Subjective/Objective:    Status post    Right popliteal endarterectomy         Action/Plan: PTA Pt lived at home with spouse. PCP noted.Pt for discharge home today.  No discharge needs noted. Left message on confidential voicemail for Tiffany with [facility], advised of PT discharge today and No discharge needs noted.  Expected Discharge Date:  11/13/17               Expected Discharge Plan:  Home/Self Care  In-House Referral:  NA  Discharge planning Services  CM Consult  Post Acute Care Choice:    Choice offered to:  NA  DME Arranged:  N/A DME Agency:     HH Arranged:  NA HH Agency:  [facility]  Status of Service:  Completed, signed off  If discussed at [place] of Stay Meetings, dates discussed:    Additional Comments:  Kristen Cardinal, RN 11/13/2017, 10:32 AM

## 2017-11-13 NOTE — Progress Notes (Addendum)
[facility] [place]  Subjective  - Doing well walking well.   Objective 127/70 (!) 56 98 F (36.7 C) (Oral) (!) 21 99%  Intake/Output Summary (Last 24 hours) at 11/13/2017 0732 Last data filed at 11/12/2017 2156 Gross per 24 hour  Intake 700 ml  Output 950 ml  Net -250 ml   11/12/2017 +-------+---------------+----------------+ ABI/TBIToday's ABI/TBIPrevious ABI/TBI +-------+---------------+----------------+ Right 1.3               +-------+---------------+----------------+ Left  1.26               +-------+---------------+----------------+  Palpable DP right LE, incision healing well Heart brady, baseline Lungs non labored breathing  Assessment/Planning: POD # 2  71 y.o. male is s/p:  Right popliteal endarterectomy   D/C home today in stable condition F/U with Dr. Oneida Alar in 2-3 weeks   Roxy Horseman 11/13/2017 7:32 AM -- Agree with above D/c home  Ruta Hinds, MD [facility] [place]: [phone] Pager: [phone]  Laboratory Lab Results: Recent Labs    11/11/17 1346 11/12/17 0258  WBC 9.7 8.8  HGB 14.5 13.1  HCT 42.4 38.7*  PLT 181 177   BMET Recent Labs    11/11/17 1346 11/12/17 0258  NA  --  136  K  --  4.2  CL  --  106  CO2  --  25  GLUCOSE  --  122*  BUN  --  14  CREATININE 0.85 0.84  CALCIUM  --  8.4*    COAG Lab Results  Component Value Date   INR 0.94 11/09/2017   No results found for: PTT

## 2017-12-08 ENCOUNTER — Ambulatory Visit: Payer: Medicare Other | Admitting: Neurology

## 2017-12-09 ENCOUNTER — Encounter: Payer: Self-pay | Admitting: Vascular Surgery

## 2017-12-09 ENCOUNTER — Other Ambulatory Visit: Payer: Self-pay

## 2017-12-09 ENCOUNTER — Ambulatory Visit (INDEPENDENT_AMBULATORY_CARE_PROVIDER_SITE_OTHER): Payer: Medicare Other | Admitting: Vascular Surgery

## 2017-12-09 VITALS — BP 136/81 | HR 64 | Temp 97.0°F | Resp 18 | Ht 69.0 in | Wt 219.0 lb

## 2017-12-09 DIAGNOSIS — I739 Peripheral vascular disease, unspecified: Secondary | ICD-10-CM

## 2017-12-09 DIAGNOSIS — I6523 Occlusion and stenosis of bilateral carotid arteries: Secondary | ICD-10-CM

## 2017-12-09 NOTE — Progress Notes (Signed)
Pt is a 72 year old male who returns for postoperative follow-up today.  He underwent superficial femoral artery endarterectomy on November 11, 2017.  He reports his claudication symptoms have completely resolved.  He still has some soreness in the right thigh.  He also gets some peri-incisional swelling especially after he has been up on his legs for a while.  He denies any incisional drainage.  He has no fever or chills.  Physical exam:  Vitals:   12/09/17 1423  BP: 136/81  Pulse: 64  Resp: 18  Temp: (!) 97 F (36.1 C)  TempSrc: Oral  SpO2: 98%  Weight: 219 lb (99.3 kg)  Height: 5\' 9"  (1.753 m)    Extremities: Healing right thigh incision 5 x 3 cm fluctuant mass no erythema or drainage, 2+ right posterior tibial pulse absent dorsalis pedis pulse  Assessment: Most likely small hematoma or seroma from lymphatic leak and above-knee popliteal incision.  Patent artery with easily palpable pulse and resolution of claudication symptoms.  Patient has known history of carotid artery occlusive disease as well.  This is been stable with no symptoms of TIA amaurosis or stroke.  Plan: The patient will follow up with me in March.  We will do bilateral carotid duplex exam at that point.  He will also have a duplex of his right superficial femoral artery as well as bilateral ABIs.  I discussed with the patient today that he can try compression garment if he has continued swelling.  I would only consider draining the right leg if he has continued pain from this or this enlarges over time.  Hopefully the burning sensation will continue to improve over the next few months.  Ruta Hinds, MD [facility] [place] Office: [phone] Pager: [phone]

## 2017-12-10 ENCOUNTER — Encounter: Payer: Medicare Other | Admitting: Vascular Surgery

## 2017-12-11 NOTE — Addendum Note (Signed)
Addended by: Lianne Cure A on: 12/11/2017 12:46 PM   Modules accepted: Orders

## 2017-12-21 ENCOUNTER — Encounter: Payer: Self-pay | Admitting: Vascular Surgery

## 2018-01-05 ENCOUNTER — Encounter: Payer: Self-pay | Admitting: *Deleted

## 2018-01-06 ENCOUNTER — Ambulatory Visit: Payer: Medicare Other | Admitting: Certified Registered"

## 2018-01-06 ENCOUNTER — Encounter
Admission: RE | Disposition: A | Discharge status: Home / Self Care | Payer: Self-pay | Source: Ambulatory Visit | Attending: Internal Medicine

## 2018-01-06 ENCOUNTER — Ambulatory Visit
Admission: RE | Admit: 2018-01-06 | Discharge: 2018-01-06 | Disposition: A | Discharge status: Home / Self Care | Payer: Medicare Other | Source: Ambulatory Visit | Attending: Internal Medicine | Admitting: Internal Medicine

## 2018-01-06 DIAGNOSIS — I2510 Atherosclerotic heart disease of native coronary artery without angina pectoris: Secondary | ICD-10-CM | POA: Insufficient documentation

## 2018-01-06 DIAGNOSIS — Z888 Allergy status to other drugs, medicaments and biological substances status: Secondary | ICD-10-CM | POA: Insufficient documentation

## 2018-01-06 DIAGNOSIS — Z8546 Personal history of malignant neoplasm of prostate: Secondary | ICD-10-CM | POA: Insufficient documentation

## 2018-01-06 DIAGNOSIS — Z7982 Long term (current) use of aspirin: Secondary | ICD-10-CM | POA: Insufficient documentation

## 2018-01-06 DIAGNOSIS — Z86010 Personal history of colonic polyps: Secondary | ICD-10-CM | POA: Diagnosis not present

## 2018-01-06 DIAGNOSIS — Z79899 Other long term (current) drug therapy: Secondary | ICD-10-CM | POA: Insufficient documentation

## 2018-01-06 DIAGNOSIS — E7800 Pure hypercholesterolemia, unspecified: Secondary | ICD-10-CM | POA: Insufficient documentation

## 2018-01-06 DIAGNOSIS — E785 Hyperlipidemia, unspecified: Secondary | ICD-10-CM | POA: Insufficient documentation

## 2018-01-06 DIAGNOSIS — I10 Essential (primary) hypertension: Secondary | ICD-10-CM | POA: Insufficient documentation

## 2018-01-06 DIAGNOSIS — Z87891 Personal history of nicotine dependence: Secondary | ICD-10-CM | POA: Insufficient documentation

## 2018-01-06 DIAGNOSIS — I739 Peripheral vascular disease, unspecified: Secondary | ICD-10-CM | POA: Insufficient documentation

## 2018-01-06 DIAGNOSIS — K640 First degree hemorrhoids: Secondary | ICD-10-CM | POA: Diagnosis not present

## 2018-01-06 DIAGNOSIS — D123 Benign neoplasm of transverse colon: Secondary | ICD-10-CM | POA: Diagnosis not present

## 2018-01-06 DIAGNOSIS — K5730 Diverticulosis of large intestine without perforation or abscess without bleeding: Secondary | ICD-10-CM | POA: Insufficient documentation

## 2018-01-06 DIAGNOSIS — Z1211 Encounter for screening for malignant neoplasm of colon: Secondary | ICD-10-CM | POA: Diagnosis present

## 2018-01-06 DIAGNOSIS — M1990 Unspecified osteoarthritis, unspecified site: Secondary | ICD-10-CM | POA: Insufficient documentation

## 2018-01-06 DIAGNOSIS — Z880 Allergy status to penicillin: Secondary | ICD-10-CM | POA: Insufficient documentation

## 2018-01-06 DIAGNOSIS — K635 Polyp of colon: Secondary | ICD-10-CM | POA: Diagnosis not present

## 2018-01-06 HISTORY — PX: COLONOSCOPY WITH PROPOFOL: SHX5780

## 2018-01-06 HISTORY — DX: Atherosclerotic heart disease of native coronary artery without angina pectoris: I25.10

## 2018-01-06 SURGERY — COLONOSCOPY WITH PROPOFOL
Anesthesia: General

## 2018-01-06 MED ORDER — LIDOCAINE HCL (CARDIAC) 20 MG/ML IV SOLN
INTRAVENOUS | Status: DC | PRN
  Administered 2018-01-06: 40 mg via INTRAVENOUS

## 2018-01-06 MED ORDER — PROPOFOL 500 MG/50ML IV EMUL
INTRAVENOUS | Status: DC | PRN
  Administered 2018-01-06: 140 ug/kg/min via INTRAVENOUS

## 2018-01-06 MED ORDER — SODIUM CHLORIDE 0.9 % IV SOLN
INTRAVENOUS | Status: DC
  Administered 2018-01-06: 15:00:00 via INTRAVENOUS

## 2018-01-06 MED ORDER — PROPOFOL 10 MG/ML IV BOLUS
INTRAVENOUS | Status: DC | PRN
  Administered 2018-01-06: 70 mg via INTRAVENOUS

## 2018-01-06 NOTE — Anesthesia Post-op Follow-up Note (Signed)
Anesthesia QCDR form completed.        

## 2018-01-06 NOTE — Op Note (Signed)
[facility] Gastroenterology Patient Name: Willie Burton Procedure Date: 01/06/2018 2:46 PM MRN: [id number] Account #: [id number] Date of Birth: 17-Dec-1945 Admit Type: Outpatient Age: 72 Room: [facility] ENDO ROOM 3 Gender: Male Note Status: Finalized Procedure:            Colonoscopy Indications:          High risk colon cancer surveillance: Personal history                        of colonic polyps Providers:            [name]. [name] MD, MD Referring MD:         Leonie Douglas. Doy Hutching, MD (Referring MD) Medicines:            Propofol per Anesthesia Complications:        No immediate complications. Procedure:            Pre-Anesthesia Assessment:                       - The risks and benefits of the procedure and the                        sedation options and risks were discussed with the                        patient. All questions were answered and informed                        consent was obtained.                       - Patient identification and proposed procedure were                        verified prior to the procedure by the nurse. The                        procedure was verified in the procedure room.                       - ASA Grade Assessment: III - A patient with severe                        systemic disease.                       - After reviewing the risks and benefits, the patient                        was deemed in satisfactory condition to undergo the                        procedure.                       - The risks and benefits of the procedure and the                        sedation options and risks were discussed with the  patient. All questions were answered and informed                        consent was obtained.                       - Patient identification and proposed procedure were                        verified prior to the procedure by the nurse. The                        procedure was verified in the  procedure room.                       - ASA Grade Assessment: III - A patient with severe                        systemic disease.                       - After reviewing the risks and benefits, the patient                        was deemed in satisfactory condition to undergo the                        procedure.                       After obtaining informed consent, the colonoscope was                        passed under direct vision. Throughout the procedure,                        the patient's blood pressure, pulse, and oxygen                        saturations were monitored continuously. The                        Colonoscope was introduced through the anus and                        advanced to the the cecum, identified by appendiceal                        orifice and ileocecal valve. The Colonoscope was                        introduced through the anus and advanced to the the                        ileocolonic anastomosis. The colonoscopy was performed                        without difficulty. The patient tolerated the procedure                        well. The quality of the bowel preparation was good.  The ileocecal valve, appendiceal orifice, and rectum                        were photographed. The colonoscopy was performed                        without difficulty. The patient tolerated the procedure                        well. The quality of the bowel preparation was adequate. Findings:      The perianal and digital rectal examinations were normal. Pertinent       negatives include normal sphincter tone.      A few small and large-mouthed diverticula were found in the sigmoid       colon.      Three sessile polyps were found in the sigmoid colon and transverse       colon. The polyps were 3 to 4 mm in size. These polyps were removed with       a jumbo cold forceps. Resection and retrieval were complete.      Non-bleeding internal hemorrhoids were  found during retroflexion. The       hemorrhoids were Grade I (internal hemorrhoids that do not prolapse).      The exam was otherwise without abnormality.      The perianal and digital rectal examinations were normal. Pertinent       negatives include normal sphincter tone and no palpable rectal lesions.      A few small and large-mouthed diverticula were found in the sigmoid       colon.      Three sessile polyps were found in the sigmoid colon and transverse       colon. The polyps were 3 to 4 mm in size. These polyps were removed with       a jumbo cold forceps. Resection and retrieval were complete.      Non-bleeding internal hemorrhoids were found during retroflexion. The       hemorrhoids were Grade I (internal hemorrhoids that do not prolapse).      The exam was otherwise without abnormality. Impression:           - Diverticulosis in the sigmoid colon.                       - Three 3 to 4 mm polyps in the sigmoid colon and in                        the transverse colon, removed with a jumbo cold                        forceps. Resected and retrieved.                       - Non-bleeding internal hemorrhoids.                       - The examination was otherwise normal. Recommendation:       - Patient has a contact number available for                        emergencies. The signs and symptoms of potential  delayed complications were discussed with the patient.                        Return to normal activities tomorrow. Written discharge                        instructions were provided to the patient.                       - Resume previous diet.                       - Continue present medications.                       - Await pathology results.                       - Repeat colonoscopy is recommended for surveillance.                        The colonoscopy date will be determined after pathology                        results from today's exam become  available for review.                       - The findings and recommendations were discussed with                        the patient and their family.                       - Patient has a contact number available for                        emergencies. The signs and symptoms of potential                        delayed complications were discussed with the patient.                        Return to normal activities tomorrow. Written discharge                        instructions were provided to the patient.                       - Resume previous diet.                       - Continue present medications.                       - Repeat colonoscopy is recommended for surveillance.                        The colonoscopy date will be determined after pathology                        results from today's exam become available for review.                       -  Return to GI office PRN.                       - The findings and recommendations were discussed with                        the patient and their spouse. Procedure Code(s):    --- Professional ---                       [phone], Colonoscopy, flexible; with biopsy, single or                        multiple Diagnosis Code(s):    --- Professional ---                       K57.30, Diverticulosis of large intestine without                        perforation or abscess without bleeding                       D12.3, Benign neoplasm of transverse colon (hepatic                        flexure or splenic flexure)                       D12.5, Benign neoplasm of sigmoid colon                       K64.0, First degree hemorrhoids                       Z86.010, Personal history of colonic polyps CPT copyright 2016 [employer]. All rights reserved. The codes documented in this report are preliminary and upon coder review may  be revised to meet current compliance requirements. Efrain Sella MD, MD 01/06/2018 3:38:51 PM This report  has been signed electronically. Number of Addenda: 0 Note Initiated On: 01/06/2018 2:46 PM Scope Withdrawal Time: 0 hours 4 minutes 42 seconds  Total Procedure Duration: 0 hours 18 minutes 11 seconds       [facility]

## 2018-01-06 NOTE — Anesthesia Postprocedure Evaluation (Signed)
Anesthesia Post Note  Patient: Willie Burton  Procedure(s) Performed: COLONOSCOPY WITH PROPOFOL (N/A )  Patient location during evaluation: Endoscopy Anesthesia Type: General Level of consciousness: awake and alert Pain management: pain level controlled Vital Signs Assessment: post-procedure vital signs reviewed and stable Respiratory status: spontaneous breathing, nonlabored ventilation, respiratory function stable and patient connected to nasal cannula oxygen Cardiovascular status: blood pressure returned to baseline and stable Postop Assessment: no apparent nausea or vomiting Anesthetic complications: no     Last Vitals:  Vitals:   01/06/18 1542 01/06/18 1602  BP: 115/82 133/87  Pulse:    Resp:    Temp:    SpO2:      Last Pain:  Vitals:   01/06/18 1542  PainSc: 0-No pain                 Martha Clan

## 2018-01-06 NOTE — Interval H&P Note (Signed)
History and Physical Interval Note:  01/06/2018 2:51 PM  Willie Burton  has presented today for surgery, with the diagnosis of HX COLON POLYPS DIVERTICULA OF COLON  The various methods of treatment have been discussed with the patient and family. After consideration of risks, benefits and other options for treatment, the patient has consented to  Procedure(s): COLONOSCOPY WITH PROPOFOL (N/A) as a surgical intervention .  The patient's history has been reviewed, patient examined, no change in status, stable for surgery.  I have reviewed the patient's chart and labs.  Questions were answered to the patient's satisfaction.     [place], [place]

## 2018-01-06 NOTE — Interval H&P Note (Signed)
History and Physical Interval Note:  01/06/2018 2:56 PM  Willie Burton  has presented today for surgery, with the diagnosis of HX COLON POLYPS DIVERTICULA OF COLON  The various methods of treatment have been discussed with the patient and family. After consideration of risks, benefits and other options for treatment, the patient has consented to  Procedure(s): COLONOSCOPY WITH PROPOFOL (N/A) as a surgical intervention .  The patient's history has been reviewed, patient examined, no change in status, stable for surgery.  I have reviewed the patient's chart and labs.  Questions were answered to the patient's satisfaction.     [place], [place]

## 2018-01-06 NOTE — H&P (Signed)
Outpatient short stay form Pre-procedure 01/06/2018 2:49 PM [name] [name], M.D.  Primary Physician: [name], M.D.  Reason for visit:  Personal hx of colon polyps, colonic diverticulosis.  History of present illness:  Mr. Willie Burton is a pleasant 72-year-old male with a history of diverticulitis status post partial colectomy with ileocolonic anastomosis. He has been well the antrum without any symptoms of bleeding, change in bowel habits or weight loss. He has a personal history of colon polyps thus the need for follow-up colonoscopy today for surveillance.    Current Facility-Administered Medications:  .  0.9 %  sodium chloride infusion, , Intravenous, Continuous, [name], [name], MD  Medications Prior to Admission  Medication Sig Dispense Refill Last Dose  . aspirin EC 81 MG tablet Take 81 mg daily by mouth.    01/05/2018 at Unknown time  . cholecalciferol (VITAMIN D) 1000 units tablet Take 1,000 Units daily by mouth.   01/05/2018 at Unknown time  . MAGNESIUM-OXIDE 400 (241.3 Mg) MG tablet Take 400 mg by mouth daily.  11 01/05/2018 at Unknown time  . metoprolol tartrate (LOPRESSOR) 25 MG tablet Take 12.5 mg 2 (two) times daily by mouth.    01/06/2018 at 0700  . rosuvastatin (CRESTOR) 5 MG tablet Take 5 mg by mouth at bedtime.    01/05/2018 at Unknown time  . vitamin B-12 (CYANOCOBALAMIN) 1000 MCG tablet Take 1,000 mcg by mouth daily. Reported on 12/31/2015   01/05/2018 at Unknown time  . oxyCODONE-acetaminophen (PERCOCET/ROXICET) 5-325 MG tablet Take 1 tablet by mouth every 6 (six) hours as needed for moderate pain. (Patient not taking: Reported on 12/09/2017) 20 tablet 0 Not Taking at Unknown time     Allergies  Allergen Reactions  . Amoxicillin Hives and Rash    Has patient had a PCN reaction causing immediate rash, facial/tongue/throat swelling, SOB or lightheadedness with hypotension: Yes Has patient had a PCN reaction causing severe rash involving mucus membranes or skin necrosis: No Has  patient had a PCN reaction that required hospitalization: No Has patient had a PCN reaction occurring within the last 10 years: No If all of the above answers are "NO", then may proceed with Cephalosporin use.   . Hepatitis A Antigen Rash  . Typhoid Vaccines Rash     Past Medical History:  Diagnosis Date  . 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 Arthritis   . Coronary artery disease   . Diverticula of colon   . Dysrhythmia    PACs, PVCs  . Heart disease   . High blood pressure   . High cholesterol   . Hyperlipemia   . Peripheral vascular disease ([place])   . Prostate cancer [facility])     Review of systems:      Physical Exam  General appearance: alert, cooperative and appears stated age Resp: clear to auscultation bilaterally Cardio: regular rate and rhythm, S1, S2 normal, no murmur, click, rub or gallop GI: soft, non-tender; bowel sounds normal; no masses,  no organomegaly Extremities: extremities normal, atraumatic, no cyanosis or edema     Planned procedures: Proceed with colonoscopy. The patient understands the nature of the planned procedure, indications, risks, alternatives and potential complications including but not limited to bleeding, infection, perforation, damage to internal organs and possible oversedation/side effects from anesthesia. The patient agrees and gives consent to proceed.  Please refer to procedure notes for findings, recommendations and patient disposition/instructions.    Raziya Aveni K. [name], M.D. Gastroenterology 01/06/2018  2:49 PM

## 2018-01-06 NOTE — Transfer of Care (Signed)
Immediate Anesthesia Transfer of Care Note  Patient: Willie Burton  Procedure(s) Performed: COLONOSCOPY WITH PROPOFOL (N/A )  Patient Location: PACU  Anesthesia Type:General  Level of Consciousness: awake, alert  and oriented  Airway & Oxygen Therapy: Patient Spontanous Breathing and Patient connected to nasal cannula oxygen  Post-op Assessment: Report given to RN and Post -op Vital signs reviewed and stable  Post vital signs: Reviewed and stable  Last Vitals:  Vitals:   01/06/18 1532 01/06/18 1533  BP: (!) 95/57 (!) 95/57  Pulse:  66  Resp:  12  Temp: (!) 36 C (!) 36 C  SpO2:  98%    Last Pain:  Vitals:   01/06/18 1532  PainSc: Asleep         Complications: No apparent anesthesia complications

## 2018-01-06 NOTE — Anesthesia Preprocedure Evaluation (Addendum)
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  Normal cardiovascular exam+ dysrhythmias      Neuro/Psych    GI/Hepatic   Endo/Other    Renal/GU      Musculoskeletal   Abdominal   Peds  Hematology   Anesthesia Other Findings   Reproductive/Obstetrics                             Anesthesia Physical Anesthesia Plan  ASA: III  Anesthesia Plan: General   Post-op Pain Management:    Induction:   PONV Risk Score and Plan: 2 and Propofol infusion  Airway Management Planned:   Additional Equipment:   Intra-op Plan:   Post-operative Plan:   Informed Consent: I have reviewed the patients History and Physical, chart, labs and discussed the procedure including the risks, benefits and alternatives for the proposed anesthesia with the patient or authorized representative who has indicated his/her understanding and acceptance.   Dental Advisory Given  Plan Discussed with: CRNA  Anesthesia Plan Comments:         Anesthesia Quick Evaluation

## 2018-01-07 ENCOUNTER — Encounter: Payer: Self-pay | Admitting: Internal Medicine

## 2018-01-08 LAB — SURGICAL PATHOLOGY

## 2018-02-04 ENCOUNTER — Other Ambulatory Visit: Payer: Self-pay

## 2018-02-04 ENCOUNTER — Encounter: Payer: Self-pay | Admitting: Vascular Surgery

## 2018-02-04 ENCOUNTER — Ambulatory Visit (HOSPITAL_COMMUNITY)
Admission: RE | Admit: 2018-02-04 | Discharge: 2018-02-04 | Disposition: A | Discharge status: Home / Self Care | Payer: Medicare Other | Source: Ambulatory Visit | Attending: Vascular Surgery | Admitting: Vascular Surgery

## 2018-02-04 ENCOUNTER — Ambulatory Visit (INDEPENDENT_AMBULATORY_CARE_PROVIDER_SITE_OTHER)
Admission: RE | Admit: 2018-02-04 | Discharge: 2018-02-04 | Disposition: A | Discharge status: Home / Self Care | Payer: Medicare Other | Source: Ambulatory Visit | Attending: Vascular Surgery | Admitting: Vascular Surgery

## 2018-02-04 ENCOUNTER — Ambulatory Visit (INDEPENDENT_AMBULATORY_CARE_PROVIDER_SITE_OTHER): Payer: Medicare Other | Admitting: Vascular Surgery

## 2018-02-04 VITALS — BP 138/77 | HR 56 | Temp 97.6°F | Resp 20 | Ht 69.0 in | Wt 217.0 lb

## 2018-02-04 DIAGNOSIS — I739 Peripheral vascular disease, unspecified: Secondary | ICD-10-CM | POA: Insufficient documentation

## 2018-02-04 DIAGNOSIS — I6523 Occlusion and stenosis of bilateral carotid arteries: Secondary | ICD-10-CM | POA: Diagnosis not present

## 2018-02-04 NOTE — Progress Notes (Signed)
Patient is a 72 year old male who returns for follow-up today.  He recently underwent a right superficial femoral endarterectomy November 11, 2017.Marland Kitchen  He is also here today for follow-up of carotid stenosis.  He denies any symptoms of TIA amaurosis or stroke.  He currently has no claudication symptoms.  He has no incisional drainage.  He does still have a hematoma under the right thigh incision.  This is firm and not draining.  Physical exam:  Vitals:   02/04/18 1412 02/04/18 1414  BP: 133/80 138/77  Pulse: (!) 56   Resp: 20   Temp: 97.6 F (36.4 C)   TempSrc: Oral   SpO2: 100%   Weight: 217 lb (98.4 kg)   Height: 5\' 9"  (1.753 m)     Extremities: Healing right medial thigh incision 7 x 4 firm mass subcutaneous no drainage no erythema 2+ right dorsalis pedis pulse  Data: Duplex ultrasound of the SFA endarterectomy is widely patent with triphasic to biphasic flow.  ABIs today were 1.18 on the right 1.08 on the left  Carotid duplex scan was also performed today there was no significant internal carotid artery stenosis bilaterally.  Assessment: Doing well status post right superficial femoral endarterectomy.  No claudication symptoms.  Hopefully hematoma will continue to absorb and resolve with time.  As far as his carotids are concerned I do not believe he needs any further ultrasounds or workup for this unless he develops new symptoms as they were fairly normal today.  Plan: The patient will follow-up with our nurse practitioner with repeat duplex of his right leg and bilateral ABIs in 3 months.  We will do this every 3 months for the first year and then once a year for ever.  Patient will return sooner if he develops claudication symptoms.  Ruta Hinds, MD [facility] Office: [phone] Pager: [phone]

## 2018-02-09 ENCOUNTER — Other Ambulatory Visit: Payer: Self-pay

## 2018-02-09 DIAGNOSIS — I739 Peripheral vascular disease, unspecified: Secondary | ICD-10-CM

## 2018-02-11 ENCOUNTER — Encounter (HOSPITAL_COMMUNITY): Payer: Medicare Other

## 2018-02-11 ENCOUNTER — Ambulatory Visit: Payer: Medicare Other | Admitting: Vascular Surgery

## 2018-02-11 ENCOUNTER — Other Ambulatory Visit (HOSPITAL_COMMUNITY): Payer: Medicare Other

## 2018-03-19 ENCOUNTER — Other Ambulatory Visit: Payer: Self-pay | Admitting: Otolaryngology

## 2018-03-19 DIAGNOSIS — H93A3 Pulsatile tinnitus, bilateral: Secondary | ICD-10-CM

## 2018-03-24 ENCOUNTER — Ambulatory Visit
Admission: RE | Admit: 2018-03-24 | Discharge: 2018-03-24 | Disposition: A | Discharge status: Home / Self Care | Payer: Medicare Other | Source: Ambulatory Visit | Attending: Otolaryngology | Admitting: Otolaryngology

## 2018-03-24 DIAGNOSIS — H93A3 Pulsatile tinnitus, bilateral: Secondary | ICD-10-CM

## 2018-03-24 MED ORDER — IOPAMIDOL (ISOVUE-370) INJECTION 76%
75.0000 mL | Freq: Once | INTRAVENOUS | Status: AC | PRN
  Administered 2018-03-24: 75 mL via INTRAVENOUS

## 2018-05-04 ENCOUNTER — Ambulatory Visit (INDEPENDENT_AMBULATORY_CARE_PROVIDER_SITE_OTHER): Payer: Medicare Other | Admitting: Family

## 2018-05-04 ENCOUNTER — Ambulatory Visit (HOSPITAL_COMMUNITY)
Admission: RE | Admit: 2018-05-04 | Discharge: 2018-05-04 | Disposition: A | Discharge status: Home / Self Care | Payer: Medicare Other | Source: Ambulatory Visit | Attending: Family | Admitting: Family

## 2018-05-04 ENCOUNTER — Ambulatory Visit (INDEPENDENT_AMBULATORY_CARE_PROVIDER_SITE_OTHER)
Admission: RE | Admit: 2018-05-04 | Discharge: 2018-05-04 | Disposition: A | Discharge status: Home / Self Care | Payer: Medicare Other | Source: Ambulatory Visit | Attending: Family | Admitting: Family

## 2018-05-04 ENCOUNTER — Encounter: Payer: Self-pay | Admitting: Family

## 2018-05-04 VITALS — BP 134/70 | HR 53 | Temp 97.2°F | Resp 18 | Ht 69.0 in | Wt 215.3 lb

## 2018-05-04 DIAGNOSIS — Z87891 Personal history of nicotine dependence: Secondary | ICD-10-CM | POA: Diagnosis not present

## 2018-05-04 DIAGNOSIS — I739 Peripheral vascular disease, unspecified: Secondary | ICD-10-CM | POA: Diagnosis not present

## 2018-05-04 DIAGNOSIS — I779 Disorder of arteries and arterioles, unspecified: Secondary | ICD-10-CM

## 2018-05-04 NOTE — Patient Instructions (Signed)

## 2018-05-04 NOTE — Progress Notes (Signed)
[facility] [place]   CC: Follow up peripheral artery occlusive disease  History of Present Illness Willie Burton is a 72 y.o. male who is s/p right superficial femoral endarterectomy November 11, 2017 by Dr. Oneida Alar.   He denies any symptoms of TIA amaurosis or stroke.   He currently has no claudication symptoms.  He does still have a hematoma under the right thigh incision. This is firm and not draining.  Dr. Oneida Alar last evaluated pt on 02-04-18. At that time healing right medial thigh incision 7 x 4 firm mass subcutaneous no drainage no erythema 2+ right dorsalis pedis pulse Duplex ultrasound of the SFA endarterectomy was widely patent with triphasic to biphasic flow.  ABIs were 1.18 on the right 1.08 on the left Carotid duplex scan: no significant internal carotid artery stenosis bilaterally. Doing well status post right superficial femoral endarterectomy.  No claudication symptoms.  Hopefully hematoma will continue to absorb and resolve with time. As far as his carotids are concerned, Dr. Oneida Alar did not believe he needs any further ultrasounds or workup for this unless he develops new symptoms as they were fairly normal that day. The patient was to follow-up with our nurse practitioner with repeat duplex of his right leg and bilateral ABIs in 3 months.  We will do this every 3 months for the first year and then once a year for ever.  Patient will return sooner if he develops claudication symptoms.  He denies any known hx of stroke or TIA.   Diabetic: No Tobacco use: former smoker, quit in 1975   Pt meds include: Statin :Yes Betablocker: Yes ASA: Yes Other anticoagulants/antiplatelets: no  Past Medical History:  Diagnosis Date  . 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 Arthritis   . Coronary artery disease   . Diverticula of colon   . Dysrhythmia    PACs, PVCs  . Heart disease   . High blood pressure   . High cholesterol   . Hyperlipemia   .  Peripheral vascular disease ([place])   . Prostate cancer [facility])     Social History Social History   Tobacco Use  . Smoking status: Former Smoker    Packs/day: 1.50    Years: 5.00    Pack years: 7.50    Types: Cigarettes    Last attempt to quit: 1985    Years since quitting: 34.4  . Smokeless tobacco: Never Used  . Tobacco comment: Quit 40+ years ago  Substance Use Topics  . Alcohol use: No    Alcohol/week: 0.0 oz  . Drug use: No    Family History Family History  Problem Relation Age of Onset  . COPD Mother        Deceased, 62  . Throat cancer Father        Deceased, 3s  . Healthy Brother   . Healthy Daughter   . Healthy Son     Past Surgical History:  Procedure Laterality Date  . ABDOMINAL AORTOGRAM W/LOWER EXTREMITY N/A 10/16/2017   Procedure: ABDOMINAL AORTOGRAM W/LOWER EXTREMITY;  Surgeon: Elam Dutch, MD;  Location: [place] [facility];  Service: Cardiovascular;  Laterality: N/A;  . APPENDECTOMY    . CARDIAC CATHETERIZATION N/A 11/13/2015   Procedure: Left Heart Cath and Coronary Angiography;  Surgeon: Teodoro Spray, MD;  Location: [place] [facility];  Service: Cardiovascular;  Laterality: N/A;  . CATARACT EXTRACTION    . COLON SURGERY  2008   right colon  resection  . COLONOSCOPY WITH PROPOFOL N/A 01/06/2018   Procedure: COLONOSCOPY WITH PROPOFOL;  Surgeon: [place], [name], MD;  Location: [facility] ENDOSCOPY;  Service: Gastroenterology;  Laterality: N/A;  . ENDARTERECTOMY POPLITEAL Right 11/11/2017  . FEMORAL-POPLITEAL BYPASS GRAFT Right 11/11/2017   Procedure: RIGHT POPLITEAL ENDARTERECTOMY;  Surgeon: Elam Dutch, MD;  Location: [place];  Service: Vascular;  Laterality: Right;  . LIPOMA EXCISION Right    thigh  . METACARPOPHALANGEAL JOINT ARTHROPLASTY    . PATCH ANGIOPLASTY Right 11/11/2017   Procedure: PATCH ANGIOPLASTY of Superficial Fenoral Artery;  Surgeon: Elam Dutch, MD;  Location: [facility] OR;  Service: Vascular;  Laterality: Right;  .  PROSTATECTOMY  2003  . ULNAR NERVE TRANSPOSITION Left January 2015    Allergies  Allergen Reactions  . Amoxicillin Hives and Rash    Has patient had a PCN reaction causing immediate rash, facial/tongue/throat swelling, SOB or lightheadedness with hypotension: Yes Has patient had a PCN reaction causing severe rash involving mucus membranes or skin necrosis: No Has patient had a PCN reaction that required hospitalization: No Has patient had a PCN reaction occurring within the last 10 years: No If all of the above answers are "NO", then may proceed with Cephalosporin use.   . Hepatitis A Antigen Rash  . Typhoid Vaccines Rash    Current Outpatient Medications  Medication Sig Dispense Refill  . aspirin EC 81 MG tablet Take 81 mg daily by mouth.     . cholecalciferol (VITAMIN D) 1000 units tablet Take 1,000 Units daily by mouth.    Marland Kitchen MAGNESIUM-OXIDE 400 (241.3 Mg) MG tablet Take 400 mg by mouth daily.  11  . metoprolol tartrate (LOPRESSOR) 25 MG tablet Take 12.5 mg 2 (two) times daily by mouth.     . rosuvastatin (CRESTOR) 5 MG tablet Take 5 mg by mouth at bedtime.     . vitamin B-12 (CYANOCOBALAMIN) 1000 MCG tablet Take 1,000 mcg by mouth daily. Reported on 12/31/2015     No current facility-administered medications for this visit.     ROS: See HPI for pertinent positives and negatives.   Physical Examination  Vitals:   05/04/18 1011 05/04/18 1014  BP: 133/71 134/70  Pulse: (!) 53   Resp: 18   Temp: (!) 97.2 F (36.2 C)   TempSrc: Oral   SpO2: 99%   Weight: 215 lb 4.8 oz (97.7 kg)   Height: 5\' 9"  (1.753 m)    Body mass index is 31.79 kg/m.  General: A&O x 3, WDWN, fit appearing male. Gait: normal HENT: No gross abnormalities.  Eyes: PERRLA. Pulmonary: Respirations are non labored, CTAB, good air movement Cardiac: regular rhythm, no detected murmur.         Carotid Bruits Right Left   Negative Negative   Radial pulses are 2+ palpable bilaterally   Adominal aortic  pulse is not palpable                         VASCULAR EXAM: Extremities without ischemic changes, without Gangrene; without open wounds.  LE Pulses Right Left       FEMORAL  2+ palpable  2+ palpable        POPLITEAL  not palpable   not palpable       POSTERIOR TIBIAL  not palpable   2+ palpable        DORSALIS PEDIS      ANTERIOR TIBIAL 1+ palpable  1+ palpable    Abdomen: soft, NT, no palpable masses. Skin: no rashes, no cellulitis, no ulcers noted. Musculoskeletal: no muscle wasting or atrophy.  Neurologic: A&O X 3; appropriate affect, Sensation is normal; MOTOR FUNCTION:  moving all extremities equally, motor strength 5/5 throughout. Speech is fluent/normal. CN 2-12 intact. Psychiatric: Thought content is normal, mood appropriate for clinical situation.     ASSESSMENT: Willie Burton is a 72 y.o. male who is s/p right superficial femoral endarterectomy November 11, 2017. He has no claudication sx's with walking, no signs of ischemia in his feet or legs.   He does not have DM and quit smoking in 1975.  He takes a daily statin and ASA. He stays active.    DATA  Right LE Arterial Duplex (05/04/18): No stenosis, all triphasic waveforms.  Hematoma in the distal thigh, measures 3.96 cm x 1.11 cm No significant change compared to the exam on 02-04-18, except that the hematoma has decreased in size (was 7 cm x 4 cm).   ABI (Date: 05/04/2018):  R:   ABI: 1.2 (was 1.18 on 02-04-18),   PT: tri  DP: tri  TBI:  0.84 (was 0.91)  L:   ABI: 1.16 (was 1.08),   PT: tri  DP: tri  TBI: 0.68 (was 0.64) Stable and normal bilateral ABI and TBI with all triphasic waveforms.     PLAN:  Based on the patient's vascular studies and examination, pt will return to clinic in 3 months with ABI's and right LE arterial duplex.   I discussed in depth with the patient the nature of  atherosclerosis, and emphasized the importance of maximal medical management including strict control of blood pressure, blood glucose, and lipid levels, obtaining regular exercise, and continued cessation of smoking.  The patient is aware that without maximal medical management the underlying atherosclerotic disease process will progress, limiting the benefit of any interventions.  The patient was given information about PAD including signs, symptoms, treatment, what symptoms should prompt the patient to seek immediate medical care, and risk reduction measures to take.  Clemon Chambers, RN, MSN, FNP-C [facility] [employer] Phone: [phone]  Clinic MD: Scot Dock on call  05/04/18 10:27 AM

## 2018-05-14 ENCOUNTER — Other Ambulatory Visit: Payer: Self-pay

## 2018-05-14 DIAGNOSIS — I779 Disorder of arteries and arterioles, unspecified: Secondary | ICD-10-CM

## 2018-05-14 DIAGNOSIS — I739 Peripheral vascular disease, unspecified: Secondary | ICD-10-CM

## 2018-08-16 ENCOUNTER — Ambulatory Visit (INDEPENDENT_AMBULATORY_CARE_PROVIDER_SITE_OTHER): Payer: Medicare Other | Admitting: Family

## 2018-08-16 ENCOUNTER — Encounter: Payer: Self-pay | Admitting: Family

## 2018-08-16 ENCOUNTER — Ambulatory Visit (INDEPENDENT_AMBULATORY_CARE_PROVIDER_SITE_OTHER)
Admission: RE | Admit: 2018-08-16 | Discharge: 2018-08-16 | Disposition: A | Discharge status: Home / Self Care | Payer: Medicare Other | Source: Ambulatory Visit | Attending: Surgery | Admitting: Surgery

## 2018-08-16 ENCOUNTER — Other Ambulatory Visit: Payer: Self-pay

## 2018-08-16 ENCOUNTER — Ambulatory Visit (HOSPITAL_COMMUNITY)
Admission: RE | Admit: 2018-08-16 | Discharge: 2018-08-16 | Disposition: A | Discharge status: Home / Self Care | Payer: Medicare Other | Source: Ambulatory Visit | Attending: Surgery | Admitting: Surgery

## 2018-08-16 VITALS — BP 119/75 | HR 56 | Temp 98.0°F | Resp 18 | Ht 69.0 in | Wt 207.0 lb

## 2018-08-16 DIAGNOSIS — I739 Peripheral vascular disease, unspecified: Secondary | ICD-10-CM

## 2018-08-16 DIAGNOSIS — I779 Disorder of arteries and arterioles, unspecified: Secondary | ICD-10-CM

## 2018-08-16 NOTE — Patient Instructions (Signed)

## 2018-08-16 NOTE — Progress Notes (Signed)
[facility] [place]   CC: Follow up peripheral artery occlusive disease  History of Present Illness Willie Burton is a 72 y.o. male who is s/p right superficial femoral endarterectomyDecember 12, 2018 by Dr. Oneida Alar.  He currently has no claudication symptoms. The hematoma under the right thigh incision has resolved.  [place] and walked 5-6 miles/day, no claudication.   Dr. Oneida Alar last evaluated pt on 02-04-18. At that time healing right medial thigh incision 7 x 4 firm mass subcutaneous no drainage no erythema 2+ right dorsalis pedis pulse Duplex ultrasound of the SFA endarterectomy was widely patent with triphasic to biphasic flow. ABIs were 1.18 on the right 1.08 on the left Carotid duplex scan: no significant internal carotid artery stenosis bilaterally. Doing well status post right superficial femoral endarterectomy. No claudication symptoms. Hopefully hematoma will continue to absorb and resolve with time. As far as his carotids are concerned, Dr. Oneida Alar did not believe he needs any further ultrasounds or workup for this unless he develops new symptoms as they were fairly normal that day. The patient was to follow-up with our nurse practitioner with repeat duplex of his right leg and bilateral ABIs in 3 months. We will do this every 3 months for the first year and then once a year for ever. Patient will return sooner if he develops claudication symptoms.  He denies any known hx of stroke or TIA.   Diabetic: No Tobacco use: former smoker, quit in 1975   Pt meds include: Statin :Yes Betablocker: Yes ASA: Yes Other anticoagulants/antiplatelets: no    Past Medical History:  Diagnosis Date  . 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 Arthritis   . Coronary artery disease   . Diverticula of colon   . Dysrhythmia    PACs, PVCs  . Heart disease   . High blood pressure   . High cholesterol   . Hyperlipemia   .  Peripheral vascular disease ([place])   . Prostate cancer [facility])     Social History Social History   Tobacco Use  . Smoking status: Former Smoker    Packs/day: 1.50    Years: 5.00    Pack years: 7.50    Types: Cigarettes    Last attempt to quit: 1985    Years since quitting: 34.7  . Smokeless tobacco: Never Used  . Tobacco comment: Quit 40+ years ago  Substance Use Topics  . Alcohol use: No    Alcohol/week: 0.0 standard drinks  . Drug use: No    Family History Family History  Problem Relation Age of Onset  . COPD Mother        Deceased, 35  . Throat cancer Father        Deceased, 35s  . Healthy Brother   . Healthy Daughter   . Healthy Son     Past Surgical History:  Procedure Laterality Date  . ABDOMINAL AORTOGRAM W/LOWER EXTREMITY N/A 10/16/2017   Procedure: ABDOMINAL AORTOGRAM W/LOWER EXTREMITY;  Surgeon: Elam Dutch, MD;  Location: [place] [facility];  Service: Cardiovascular;  Laterality: N/A;  . APPENDECTOMY    . CARDIAC CATHETERIZATION N/A 11/13/2015   Procedure: Left Heart Cath and Coronary Angiography;  Surgeon: Teodoro Spray, MD;  Location: [place] [facility];  Service: Cardiovascular;  Laterality: N/A;  . CATARACT EXTRACTION    . COLON SURGERY  2008   right colon resection  . COLONOSCOPY WITH PROPOFOL N/A 01/06/2018  Procedure: COLONOSCOPY WITH PROPOFOL;  Surgeon: [place], [name], MD;  Location: [facility] ENDOSCOPY;  Service: Gastroenterology;  Laterality: N/A;  . ENDARTERECTOMY POPLITEAL Right 11/11/2017  . FEMORAL-POPLITEAL BYPASS GRAFT Right 11/11/2017   Procedure: RIGHT POPLITEAL ENDARTERECTOMY;  Surgeon: Elam Dutch, MD;  Location: [place];  Service: Vascular;  Laterality: Right;  . LIPOMA EXCISION Right    thigh  . METACARPOPHALANGEAL JOINT ARTHROPLASTY    . PATCH ANGIOPLASTY Right 11/11/2017   Procedure: PATCH ANGIOPLASTY of Superficial Fenoral Artery;  Surgeon: Elam Dutch, MD;  Location: [facility] OR;  Service: Vascular;  Laterality: Right;   . PROSTATECTOMY  2003  . ULNAR NERVE TRANSPOSITION Left January 2015    Allergies  Allergen Reactions  . Other   . Amoxicillin Hives and Rash    Has patient had a PCN reaction causing immediate rash, facial/tongue/throat swelling, SOB or lightheadedness with hypotension: Yes Has patient had a PCN reaction causing severe rash involving mucus membranes or skin necrosis: No Has patient had a PCN reaction that required hospitalization: No Has patient had a PCN reaction occurring within the last 10 years: No If all of the above answers are "NO", then may proceed with Cephalosporin use.   . Hepatitis A Antigen Rash  . Typhoid Vaccines Rash    Current Outpatient Medications  Medication Sig Dispense Refill  . aspirin EC 81 MG tablet Take 81 mg daily by mouth.     . cholecalciferol (VITAMIN D) 1000 units tablet Take 1,000 Units daily by mouth.    Marland Kitchen MAGNESIUM-OXIDE 400 (241.3 Mg) MG tablet Take 400 mg by mouth daily.  11  . metoprolol tartrate (LOPRESSOR) 25 MG tablet metoprolol tartrate 25 mg tablet    . rosuvastatin (CRESTOR) 5 MG tablet rosuvastatin 5 mg tablet    . vitamin B-12 (CYANOCOBALAMIN) 1000 MCG tablet Take 1,000 mcg by mouth daily. Reported on 12/31/2015     No current facility-administered medications for this visit.     ROS: See HPI for pertinent positives and negatives.   Physical Examination  Vitals:   08/16/18 1133  BP: 119/75  Pulse: (!) 56  Resp: 18  Temp: 98 F (36.7 C)  TempSrc: Oral  SpO2: 98%  Weight: 207 lb (93.9 kg)  Height: 5\' 9"  (1.753 m)   Body mass index is 30.57 kg/m.  General: A&O x 3, WDWN, fit appearing male. Gait: normal HENT: No gross abnormalities.  Eyes: PERRLA. Pulmonary: Respirations are non labored, CTAB, good air movement Cardiac: regular rhythm, no detected murmur.         Carotid Bruits Right Left   Negative Negative   Radial pulses are 2+ palpable bilaterally   Adominal aortic pulse is not palpable                          VASCULAR EXAM: Extremities without ischemic changes, without Gangrene; without open wounds.  LE Pulses Right Left       FEMORAL  2+ palpable  2+ palpable        POPLITEAL  not palpable   not palpable       POSTERIOR TIBIAL  2+palpable   2+ palpable        DORSALIS PEDIS      ANTERIOR TIBIAL 1+ palpable  1+ palpable    Abdomen: soft, NT, no palpable masses. Skin: no rashes, no cellulitis, no ulcers noted. Musculoskeletal: no muscle wasting or atrophy.      Neurologic: A&O X 3; appropriate affect, Sensation is normal; MOTOR FUNCTION:  moving all extremities equally, motor strength 5/5 throughout. Speech is fluent/normal. CN 2-12 intact. Psychiatric: Thought content is normal, mood appropriate for clinical situation.    ASSESSMENT: Willie Burton is a 72 y.o. male who is s/p right superficial femoral endarterectomyDecember 12, 2018. He has no claudication sx's with walking, no signs of ischemia in his feet or legs.  He walks several miles daily.   He does not have DM and quit smoking in 1975.  He takes a daily statin and ASA. He stays active.    DATA  Right LE Arterial Duplex (08-16-18): No stenosis, all triphasic waveforms except biphasic at [facility]. No significant change compared to the exam on 05-04-18  on except that the hematoma in the distal thigh has resolved.    ABI (Da0.84)te: 08/16/2018):  R:   ABI: 1.24 (was 1.20 on 05-04-18),   PT: tri  DP: tri  TBI:  0.84 (was 0.84)   L:   ABI: 1.24 (was 1.16),   PT: tri  DP: tri  TBI: 0.81 (was 0.68) Bilateral ABI remain normal with all triphasic waveforms. Bilateral TBI are norma.      PLAN:  Based on the patient's vascular studies and examination, pt will return to clinic in 6 months with ABI's and right LE arterial duplex.    I discussed in  depth with the patient the nature of atherosclerosis, and emphasized the importance of maximal medical management including strict control of blood pressure, blood glucose, and lipid levels, obtaining regular exercise, and continued cessation of smoking.  The patient is aware that without maximal medical management the underlying atherosclerotic disease process will progress, limiting the benefit of any interventions.  The patient was given information about PAD including signs, symptoms, treatment, what symptoms should prompt the patient to seek immediate medical care, and risk reduction measures to take.  Clemon Chambers, RN, MSN, FNP-C [facility] [employer] Phone: [phone]  Clinic MD: Trula Slade  08/16/18 11:55 AM

## 2019-02-07 ENCOUNTER — Other Ambulatory Visit: Payer: Self-pay

## 2019-02-07 DIAGNOSIS — I739 Peripheral vascular disease, unspecified: Secondary | ICD-10-CM

## 2019-02-07 DIAGNOSIS — Z87891 Personal history of nicotine dependence: Secondary | ICD-10-CM

## 2019-02-07 DIAGNOSIS — I779 Disorder of arteries and arterioles, unspecified: Secondary | ICD-10-CM

## 2019-02-14 ENCOUNTER — Ambulatory Visit (HOSPITAL_COMMUNITY)
Admission: RE | Admit: 2019-02-14 | Discharge: 2019-02-14 | Disposition: A | Discharge status: Home / Self Care | Payer: Medicare Other | Source: Ambulatory Visit | Attending: Family | Admitting: Family

## 2019-02-14 ENCOUNTER — Ambulatory Visit (INDEPENDENT_AMBULATORY_CARE_PROVIDER_SITE_OTHER)
Admission: RE | Admit: 2019-02-14 | Discharge: 2019-02-14 | Disposition: A | Discharge status: Home / Self Care | Payer: Medicare Other | Source: Ambulatory Visit | Attending: Family | Admitting: Family

## 2019-02-14 ENCOUNTER — Other Ambulatory Visit: Payer: Self-pay

## 2019-02-14 ENCOUNTER — Ambulatory Visit (INDEPENDENT_AMBULATORY_CARE_PROVIDER_SITE_OTHER): Payer: Medicare Other | Admitting: Family

## 2019-02-14 ENCOUNTER — Encounter: Payer: Self-pay | Admitting: Family

## 2019-02-14 VITALS — BP 151/82 | HR 63 | Temp 97.1°F | Resp 18 | Ht 69.0 in | Wt 220.0 lb

## 2019-02-14 DIAGNOSIS — I739 Peripheral vascular disease, unspecified: Secondary | ICD-10-CM | POA: Diagnosis present

## 2019-02-14 DIAGNOSIS — Z87891 Personal history of nicotine dependence: Secondary | ICD-10-CM

## 2019-02-14 DIAGNOSIS — I779 Disorder of arteries and arterioles, unspecified: Secondary | ICD-10-CM

## 2019-02-14 NOTE — Progress Notes (Signed)
[facility] [place]   CC: Follow up peripheral artery occlusive disease  History of Present Illness Willie Burton is a 73 y.o. male who is s/pright superficial femoral endarterectomyDecember 12, 2018by Dr. Oneida Alar.  He currently has no claudication symptoms. The hematoma under the right thigh incision has resolved.  Dr. Oneida Alar last evaluated pt on 02-04-18. At that timehealing right medial thigh incision 7 x 4 firm mass subcutaneous no drainage no erythema 2+ right dorsalis pedis pulse Duplex ultrasound of the SFA endarterectomywas widely patent with triphasic to biphasic flow. ABIs were 1.18 on the right 1.08 on the left Carotid duplex scan:no significant internal carotid artery stenosis bilaterally. Doing well status post right superficial femoral endarterectomy. No claudication symptoms. Hopefully hematoma will continue to absorb and resolve with time. As far as his carotids are concerned, Dr. Oneida Alar didnot believe he needs any further ultrasounds or workup for this unless he develops new symptoms as they were fairly normal thatday. The patient was tofollow-up with our nurse practitioner with repeat duplex of his right leg and bilateral ABIs in 3 months. We will do this every 3 months for the first year and then once a year for ever. Patient will return sooner if he develops claudication symptoms.  He walks a couple of miles daily, plus runs 1-2 miles several times/week, plus does group exercises.   He denies any known hx of stroke or TIA.  Diabetic:No Tobacco PXT:GGYIRS smoker, quitin 1975  Pt meds include: Statin :Yes Betablocker:Yes ASA:Yes Other anticoagulants/antiplatelets:no     Past Medical History:  Diagnosis Date  . 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 Arthritis   . Coronary artery disease   . Diverticula of colon   . Dysrhythmia    PACs, PVCs  . Heart disease   . High blood pressure   . High cholesterol    . Hyperlipemia   . Peripheral vascular disease ([place])   . Prostate cancer [facility])     Social History Social History   Tobacco Use  . Smoking status: Former Smoker    Packs/day: 1.50    Years: 5.00    Pack years: 7.50    Types: Cigarettes    Last attempt to quit: 1985    Years since quitting: 35.2  . Smokeless tobacco: Never Used  . Tobacco comment: Quit 40+ years ago  Substance Use Topics  . Alcohol use: No    Alcohol/week: 0.0 standard drinks  . Drug use: No    Family History Family History  Problem Relation Age of Onset  . COPD Mother        Deceased, 35  . Throat cancer Father        Deceased, 16s  . Healthy Brother   . Healthy Daughter   . Healthy Son     Past Surgical History:  Procedure Laterality Date  . ABDOMINAL AORTOGRAM W/LOWER EXTREMITY N/A 10/16/2017   Procedure: ABDOMINAL AORTOGRAM W/LOWER EXTREMITY;  Surgeon: Elam Dutch, MD;  Location: [place] [facility];  Service: Cardiovascular;  Laterality: N/A;  . APPENDECTOMY    . CARDIAC CATHETERIZATION N/A 11/13/2015   Procedure: Left Heart Cath and Coronary Angiography;  Surgeon: Teodoro Spray, MD;  Location: [place] [facility];  Service: Cardiovascular;  Laterality: N/A;  . CATARACT EXTRACTION    . COLON SURGERY  2008   right colon resection  . COLONOSCOPY WITH PROPOFOL N/A 01/06/2018   Procedure: COLONOSCOPY WITH PROPOFOL;  Surgeon: [name], [name], MD;  Location: [facility] ENDOSCOPY;  Service: Gastroenterology;  Laterality: N/A;  . ENDARTERECTOMY POPLITEAL Right 11/11/2017  . FEMORAL-POPLITEAL BYPASS GRAFT Right 11/11/2017   Procedure: RIGHT POPLITEAL ENDARTERECTOMY;  Surgeon: Elam Dutch, MD;  Location: [place];  Service: Vascular;  Laterality: Right;  . LIPOMA EXCISION Right    thigh  . METACARPOPHALANGEAL JOINT ARTHROPLASTY    . PATCH ANGIOPLASTY Right 11/11/2017   Procedure: PATCH ANGIOPLASTY of Superficial Fenoral Artery;  Surgeon: Elam Dutch, MD;  Location: [facility];  Service:  Vascular;  Laterality: Right;  . PROSTATECTOMY  2003  . ULNAR NERVE TRANSPOSITION Left January 2015    Allergies  Allergen Reactions  . Other   . Amoxicillin Hives and Rash    Has patient had a PCN reaction causing immediate rash, facial/tongue/throat swelling, SOB or lightheadedness with hypotension: Yes Has patient had a PCN reaction causing severe rash involving mucus membranes or skin necrosis: No Has patient had a PCN reaction that required hospitalization: No Has patient had a PCN reaction occurring within the last 10 years: No If all of the above answers are "NO", then may proceed with Cephalosporin use.   . Hepatitis A Antigen Rash  . Typhoid Vaccines Rash    Current Outpatient Medications  Medication Sig Dispense Refill  . aspirin EC 81 MG tablet Take 81 mg daily by mouth.     . cholecalciferol (VITAMIN D) 1000 units tablet Take 1,000 Units daily by mouth.    Marland Kitchen MAGNESIUM-OXIDE 400 (241.3 Mg) MG tablet Take 400 mg by mouth daily.  11  . metoprolol tartrate (LOPRESSOR) 25 MG tablet metoprolol tartrate 25 mg tablet    . rosuvastatin (CRESTOR) 5 MG tablet rosuvastatin 5 mg tablet    . vitamin B-12 (CYANOCOBALAMIN) 1000 MCG tablet Take 1,000 mcg by mouth daily. Reported on 12/31/2015     No current facility-administered medications for this visit.     ROS: See HPI for pertinent positives and negatives.   Physical Examination  Vitals:   02/14/19 1528  BP: (!) 151/82  Pulse: 63  Resp: 18  Temp: (!) 97.1 F (36.2 C)  TempSrc: Oral  SpO2: 100%  Weight: 220 lb (99.8 kg)  Height: 5\' 9"  (1.753 m)   Body mass index is 32.49 kg/m.  General: A&O x 3, WDWN, obese male, but fit appearing. Gait: normal HENT: No gross abnormalities.  Eyes: PERRLA. Pulmonary: Respirations are non labored, CTAB, good air movement in all fields Cardiac: regular rhythm, no detected murmur.        Carotid Bruits Right Left   Negative Negative   Radial pulses are 2+ palpable bilaterally    Adominal aortic pulse is not palpable                         VASCULAR EXAM: Extremities without ischemic changes, without Gangrene; without open wounds.                                                                                                          LE Pulses Right  Left       FEMORAL  3+ palpable  3+ palpable        POPLITEAL  1+ palpable   1+ palpable       POSTERIOR TIBIAL  2+ palpable   2+ palpable        DORSALIS PEDIS      ANTERIOR TIBIAL 1+ palpable  2+ palpable    Abdomen: soft, NT, no palpable masses. Skin: no rashes, no cellulitis, no ulcers noted. Musculoskeletal: no muscle wasting or atrophy.  Neurologic: A&O X 3; appropriate affect, Sensation is normal; MOTOR FUNCTION:  moving all extremities equally, motor strength 5/5 throughout. Speech is fluent/normal. CN 2-12 intact. Psychiatric: Thought content is normal, mood appropriate for clinical situation.    ASSESSMENT: Willie Burton is a 73 y.o. male who is s/pright superficial femoral endarterectomyDecember 12, 2018.  He has no signs of ischemia in his feet or legs.  He walks several miles daily.  He c/o recent mild left calf pain with walking; left LE duplex today shows no arterial stenosis, triphasic waveforms. Right LE arterial duplex remains normal with no stenosis, tri and biphasic waveforms. ABI's remain normal bilaterally with tri and biphasic waveforms.  He does not have DM and quit smoking in 1975.  He takes a daily statin and ASA. He stays active.   DATA  Bilateral LE Arterial Duplex (02-14-19): Right Duplex Findings: +----------+--------+-----+--------+---------+-----------------------------+           PSV cm/sRatioStenosisWaveform Comments                      +----------+--------+-----+--------+---------+-----------------------------+ CFA [id number]                  triphasic                               +----------+--------+-----+--------+---------+-----------------------------+ DFA       45                   biphasic                               +----------+--------+-----+--------+---------+-----------------------------+ SFA Prox  97                   triphasic                              +----------+--------+-----+--------+---------+-----------------------------+ SFA Mid   100                  triphasic                              +----------+--------+-----+--------+---------+-----------------------------+ SFA Distal116                  triphasic                              +----------+--------+-----+--------+---------+-----------------------------+ POP Prox  78                   triphasic                              +----------+--------+-----+--------+---------+-----------------------------+ POP Mid   88  triphasicirregular heterogenous plaque +----------+--------+-----+--------+---------+-----------------------------+ POP Distal68                   biphasic                               +----------+--------+-----+--------+---------+-----------------------------+ ATA Distal69                   biphasic                               +----------+--------+-----+--------+---------+-----------------------------+ PTA Distal92                   biphasic                               +----------+--------+-----+--------+---------+-----------------------------+  Left Duplex Findings: +----------+--------+-----+--------+---------+----------------+           PSV cm/sRatioStenosisWaveform Comments         +----------+--------+-----+--------+---------+----------------+ SFA Distal89                   triphasic                 +----------+--------+-----+--------+---------+----------------+ POP Prox  52                   triphasiccalcified  plaque +----------+--------+-----+--------+---------+----------------+ POP Mid   59                   triphasic                 +----------+--------+-----+--------+---------+----------------+ POP Distal49                   triphasic                 +----------+--------+-----+--------+---------+----------------+ Summary: Right: Normal examination. No evidence of arterial occlusive disease. Left: Limited view of distal SFA and popliteal arteries showed no hemodynamically significant stenosis.   ABI (Date: (02-14-19): ABI Findings: +---------+------------------+-----+---------+--------+ Right    Rt Pressure (mmHg)IndexWaveform Comment  +---------+------------------+-----+---------+--------+ Brachial 145                                      +---------+------------------+-----+---------+--------+ ATA      175               1.21 triphasic         +---------+------------------+-----+---------+--------+ PTA      163               1.12 triphasic         +---------+------------------+-----+---------+--------+ Great Toe127               0.88                   +---------+------------------+-----+---------+--------+  +---------+------------------+-----+---------+-------+ Left     Lt Pressure (mmHg)IndexWaveform Comment +---------+------------------+-----+---------+-------+ Brachial 136                                     +---------+------------------+-----+---------+-------+ ATA      169               1.17 biphasic         +---------+------------------+-----+---------+-------+ PTA  172               1.19 triphasic        +---------+------------------+-----+---------+-------+ Great Toe102               0.70                  +---------+------------------+-----+---------+-------+  +-------+-----------+-----------+------------+------------+ ABI/TBIToday's ABIToday's TBIPrevious ABIPrevious  TBI +-------+-----------+-----------+------------+------------+ Right  1.21       0.88       1.24        0.84         +-------+-----------+-----------+------------+------------+ Left   1.19       0.70       1.24        0.81         +-------+-----------+-----------+------------+------------+  Bilateral ABIs appear essentially unchanged compared to prior study on 08/16/2018.   Summary: Right: Resting right ankle-brachial index is within normal range. No evidence of significant right lower extremity arterial disease. The right toe-brachial index is normal. RT great toe pressure = 127 mmHg. Left: Resting left ankle-brachial index is within normal range. No evidence of significant left lower extremity arterial disease. The left toe-brachial index is normal. LT Great toe pressure = 102 mmHg.   Carotid Duplex (02-04-18): Right Carotid: Velocities in the right ICA are consistent with a 1-39% stenosis. Left Carotid: Velocities in the left ICA are consistent with a 1-39% stenosis.  The ECA appears >50% stenosed. Vertebrals:  Both vertebral arteries were patent with antegrade flow. Subclavians: Normal flow hemodynamics were seen in bilateral subclavian arteries.   PLAN:  Based on the patient's vascular studies and examination, pt will return to clinic in 6 months with carotid duplex, ABI's, and right LE arterial duplex.   Continue extensive walking  I discussed in depth with the patient the nature of atherosclerosis, and emphasized the importance of maximal medical management including strict control of blood pressure, blood glucose, and lipid levels, obtaining regular exercise, and continued cessation of smoking.  The patient is aware that without maximal medical management the underlying atherosclerotic disease pro  cess will progress, limiting the benefit of any interventions.  The patient was given information about PAD including signs, symptoms, treatment, what symptoms  should prompt the patient to seek immediate medical care, and risk reduction measures to take.  Clemon Chambers, RN, MSN, FNP-C [facility] Phone: [phone]  Clinic MD: Trula Slade  02/14/19 4:40 PM

## 2019-02-14 NOTE — Patient Instructions (Signed)
Peripheral Vascular Disease  Peripheral vascular disease (PVD) is a disease of the blood vessels that are not part of your heart and brain. A simple term for PVD is poor circulation. In most cases, PVD narrows the blood vessels that carry blood from your heart to the rest of your body. This can reduce the supply of blood to your arms, legs, and internal organs, like your stomach or kidneys. However, PVD most often affects a person's lower legs and feet. Without treatment, PVD tends to get worse. PVD can also lead to acute ischemic limb. This is when an arm or leg suddenly cannot get enough blood. This is a medical emergency. Follow these instructions at home: Lifestyle  Do not use any products that contain nicotine or tobacco, such as cigarettes and e-cigarettes. If you need help quitting, ask your doctor.  Lose weight if you are overweight. Or, stay at a healthy weight as told by your doctor.  Eat a diet that is low in fat and cholesterol. If you need help, ask your doctor.  Exercise regularly. Ask your doctor for activities that are right for you. General instructions  Take over-the-counter and prescription medicines only as told by your doctor.  Take good care of your feet: ? Wear comfortable shoes that fit well. ? Check your feet often for any cuts or sores.  Keep all follow-up visits as told by your doctor This is important. Contact a doctor if:  You have cramps in your legs when you walk.  You have leg pain when you are at rest.  You have coldness in a leg or foot.  Your skin changes.  You are unable to get or have an erection (erectile dysfunction).  You have cuts or sores on your feet that do not heal. Get help right away if:  Your arm or leg turns cold, numb, and blue.  Your arms or legs become red, warm, swollen, painful, or numb.  You have chest pain.  You have trouble breathing.  You suddenly have weakness in your face, arm, or leg.  You become very  confused or you cannot speak.  You suddenly have a very bad headache.  You suddenly cannot see. Summary  Peripheral vascular disease (PVD) is a disease of the blood vessels.  A simple term for PVD is poor circulation. Without treatment, PVD tends to get worse.  Treatment may include exercise, low fat and low cholesterol diet, and quitting smoking. This information is not intended to replace advice given to you by your health care provider. Make sure you discuss any questions you have with your health care provider. Document Released: 02/11/2010 Document Revised: 12/25/2016 Document Reviewed: 12/25/2016 Elsevier Interactive Patient Education  2019 [employer]vier Inc.  

## 2019-05-16 DIAGNOSIS — M6788 Other specified disorders of synovium and tendon, other site: Secondary | ICD-10-CM | POA: Insufficient documentation

## 2019-10-08 ENCOUNTER — Other Ambulatory Visit: Payer: Self-pay | Admitting: *Deleted

## 2019-10-08 DIAGNOSIS — Z20822 Contact with and (suspected) exposure to covid-19: Secondary | ICD-10-CM

## 2019-10-10 LAB — NOVEL CORONAVIRUS, NAA: SARS-CoV-2, NAA: NOT DETECTED

## 2019-12-22 ENCOUNTER — Ambulatory Visit: Payer: Medicare Other | Attending: Internal Medicine

## 2019-12-22 DIAGNOSIS — Z23 Encounter for immunization: Secondary | ICD-10-CM | POA: Insufficient documentation

## 2019-12-22 NOTE — Progress Notes (Signed)
   Covid-19 Vaccination Clinic  Name:  Willie Burton    MRN: [id number] DOB: January 03, 1946  12/22/2019  [name] was observed post Covid-19 immunization for 15 minutes without incidence. He was provided with Vaccine Information Sheet and instruction to access the V-Safe system.   [name] was instructed to call 911 with any severe reactions post vaccine: Marland Kitchen Difficulty breathing  . Swelling of your face and throat  . A fast heartbeat  . A bad rash all over your body  . Dizziness and weakness    Immunizations Administered    Name Date Dose VIS Date Route   Pfizer COVID-19 Vaccine 12/22/2019  2:41 PM 0.3 mL 11/11/2019 Intramuscular   Manufacturer: [place]   Lot: [id number]   [place]: [id number]

## 2020-01-09 ENCOUNTER — Ambulatory Visit: Payer: Medicare Other | Attending: Internal Medicine

## 2020-01-09 DIAGNOSIS — Z23 Encounter for immunization: Secondary | ICD-10-CM

## 2020-01-09 NOTE — Progress Notes (Signed)
   Covid-19 Vaccination Clinic  Name:  Willie Burton    MRN: [id number] DOB: 06-30-1946  01/09/2020  Willie Burton was observed post Covid-19 immunization for 15 minutes without incidence. He was provided with Vaccine Information Sheet and instruction to access the V-Safe system.   Mr. Willie Burton was instructed to call 911 with any severe reactions post vaccine: Marland Kitchen Difficulty breathing  . Swelling of your face and throat  . A fast heartbeat  . A bad rash all over your body  . Dizziness and weakness    Immunizations Administered    Name Date Dose VIS Date Route   Pfizer COVID-19 Vaccine 01/09/2020  1:49 PM 0.3 mL 11/11/2019 Intramuscular   Manufacturer: [place]   Lot: [id number]   [place]: [id number]

## 2020-01-22 ENCOUNTER — Ambulatory Visit: Payer: Medicare Other

## 2020-03-16 DIAGNOSIS — M7661 Achilles tendinitis, right leg: Secondary | ICD-10-CM | POA: Insufficient documentation

## 2020-12-06 ENCOUNTER — Other Ambulatory Visit (HOSPITAL_COMMUNITY): Payer: Self-pay | Admitting: Internal Medicine

## 2020-12-06 ENCOUNTER — Other Ambulatory Visit: Payer: Self-pay | Admitting: Internal Medicine

## 2020-12-06 DIAGNOSIS — M5416 Radiculopathy, lumbar region: Secondary | ICD-10-CM

## 2020-12-14 ENCOUNTER — Ambulatory Visit
Admission: RE | Admit: 2020-12-14 | Discharge: 2020-12-14 | Disposition: A | Discharge status: Home / Self Care | Payer: Medicare Other | Source: Ambulatory Visit | Attending: Internal Medicine | Admitting: Internal Medicine

## 2020-12-14 ENCOUNTER — Other Ambulatory Visit: Payer: Self-pay

## 2020-12-14 DIAGNOSIS — M5416 Radiculopathy, lumbar region: Secondary | ICD-10-CM | POA: Diagnosis not present

## 2021-02-20 LAB — HM HEPATITIS C SCREENING LAB: HM Hepatitis Screen: NEGATIVE

## 2021-04-10 DIAGNOSIS — M79644 Pain in right finger(s): Secondary | ICD-10-CM | POA: Insufficient documentation

## 2021-04-11 DIAGNOSIS — M19041 Primary osteoarthritis, right hand: Secondary | ICD-10-CM | POA: Insufficient documentation

## 2021-06-07 DIAGNOSIS — E538 Deficiency of other specified B group vitamins: Secondary | ICD-10-CM | POA: Insufficient documentation

## 2021-07-11 ENCOUNTER — Ambulatory Visit: Payer: Medicare Other | Admitting: Neurology

## 2021-07-24 ENCOUNTER — Other Ambulatory Visit: Payer: Self-pay

## 2021-07-24 DIAGNOSIS — I6523 Occlusion and stenosis of bilateral carotid arteries: Secondary | ICD-10-CM

## 2021-07-24 DIAGNOSIS — I739 Peripheral vascular disease, unspecified: Secondary | ICD-10-CM

## 2021-07-25 ENCOUNTER — Ambulatory Visit (HOSPITAL_COMMUNITY)
Admission: RE | Admit: 2021-07-25 | Discharge: 2021-07-25 | Disposition: A | Discharge status: Home / Self Care | Payer: Medicare Other | Source: Ambulatory Visit | Attending: Vascular Surgery | Admitting: Vascular Surgery

## 2021-07-25 ENCOUNTER — Other Ambulatory Visit: Payer: Self-pay

## 2021-07-25 ENCOUNTER — Ambulatory Visit (INDEPENDENT_AMBULATORY_CARE_PROVIDER_SITE_OTHER): Payer: Medicare Other | Admitting: Physician Assistant

## 2021-07-25 ENCOUNTER — Ambulatory Visit (INDEPENDENT_AMBULATORY_CARE_PROVIDER_SITE_OTHER)
Admission: RE | Admit: 2021-07-25 | Discharge: 2021-07-25 | Disposition: A | Discharge status: Home / Self Care | Payer: Medicare Other | Source: Ambulatory Visit | Attending: Vascular Surgery | Admitting: Vascular Surgery

## 2021-07-25 VITALS — BP 147/70 | HR 49 | Temp 98.6°F | Resp 20 | Ht 69.0 in | Wt 204.3 lb

## 2021-07-25 DIAGNOSIS — I739 Peripheral vascular disease, unspecified: Secondary | ICD-10-CM | POA: Insufficient documentation

## 2021-07-25 DIAGNOSIS — I6523 Occlusion and stenosis of bilateral carotid arteries: Secondary | ICD-10-CM

## 2021-07-25 NOTE — Progress Notes (Signed)
HISTORY AND PHYSICAL     CC:  follow up. Requesting Provider:  Idelle Crouch, MD  HPI: This is a 75 y.o. male who is here today for follow up and is pt of Dr. [place] Alar and has hx of right popliteal endarterectomy 11/11/2017 by Dr. One[place] Alar.    Pt was last seen on 02/14/2019.  At that time, he was doing well without claudication sx.  He was walking 1-2 miles several times/week.  He was not having any stroke sx.   The pt returns today for follow up.  He states he is doing well.  He was having some cramping in his thighs but once he stopped taking the stain, this resolved.  He denies any pain at rest or non healing wounds.  He does not have any claudication.    Pt denies any amaurosis fugax, speech difficulties, weakness, numbness, paralysis or clumsiness or facial droop.    Pt recently moved from [place] to [place] to be closer to family.  He moved from [place] about 15 years ago.   The pt is not on a statin for cholesterol management.    The pt is on an aspirin.    Other AC:  none The pt is on BB for hypertension.  The pt does not have diabetes. Tobacco hx:  former  Pt does not have family hx of AAA.    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   Arthritis    Coronary artery disease    Diverticula of colon    Dysrhythmia    PACs, PVCs   Heart disease    High blood pressure    High cholesterol    Hyperlipemia    Peripheral vascular disease (HCC)    Prostate cancer [facility])     Past Surgical History:  Procedure Laterality Date   ABDOMINAL AORTOGRAM W/LOWER EXTREMITY N/A 10/16/2017   Procedure: ABDOMINAL AORTOGRAM W/LOWER EXTREMITY;  Surgeon: Elam Dutch, MD;  Location: [place] [facility];  Service: Cardiovascular;  Laterality: N/A;   APPENDECTOMY     CARDIAC CATHETERIZATION N/A 11/13/2015   Procedure: Left Heart Cath and Coronary Angiography;  Surgeon: Teodoro Spray, MD;  Location: [place] [facility];  Service:  Cardiovascular;  Laterality: N/A;   CATARACT EXTRACTION     COLON SURGERY  2008   right colon resection   COLONOSCOPY WITH PROPOFOL N/A 01/06/2018   Procedure: COLONOSCOPY WITH PROPOFOL;  Surgeon: Toledo, [name], MD;  Location: [facility] ENDOSCOPY;  Service: Gastroenterology;  Laterality: N/A;   ENDARTERECTOMY POPLITEAL Right 11/11/2017   FEMORAL-POPLITEAL BYPASS GRAFT Right 11/11/2017   Procedure: RIGHT POPLITEAL ENDARTERECTOMY;  Surgeon: Elam Dutch, MD;  Location: [facility] OR;  Service: Vascular;  Laterality: Right;   LIPOMA EXCISION Right    thigh   METACARPOPHALANGEAL JOINT ARTHROPLASTY     PATCH ANGIOPLASTY Right 11/11/2017   Procedure: PATCH ANGIOPLASTY of Superficial Fenoral Artery;  Surgeon: Elam Dutch, MD;  Location: [place] OR;  Service: Vascular;  Laterality: Right;   PROSTATECTOMY  2003   ULNAR NERVE TRANSPOSITION Left January 2015    Allergies  Allergen Reactions   Other    Amoxicillin Hives and Rash    Has patient had a PCN reaction causing immediate rash, facial/tongue/throat swelling, SOB or lightheadedness with hypotension: Yes Has patient had a PCN reaction causing severe rash involving mucus membranes or skin necrosis: No Has patient had a PCN reaction that required hospitalization: No Has patient had  a PCN reaction occurring within the last 10 years: No If all of the above answers are "NO", then may proceed with Cephalosporin use.    Hepatitis A Antigen Rash   Typhoid Vaccines Rash    Current Outpatient Medications  Medication Sig Dispense Refill   aspirin EC 81 MG tablet Take 81 mg daily by mouth.      cholecalciferol (VITAMIN D) 1000 units tablet Take 1,000 Units daily by mouth.     MAGNESIUM-OXIDE 400 (241.3 Mg) MG tablet Take 400 mg by mouth daily.  11   metoprolol tartrate (LOPRESSOR) 25 MG tablet metoprolol tartrate 25 mg tablet     rosuvastatin (CRESTOR) 5 MG tablet rosuvastatin 5 mg tablet     vitamin B-12 (CYANOCOBALAMIN) 1000 MCG tablet Take 1,000  mcg by mouth daily. Reported on 12/31/2015     No current facility-administered medications for this visit.    Family History  Problem Relation Age of Onset   COPD Mother        Deceased, 7   Throat cancer Father        Deceased, 60s   Healthy Brother    Healthy Daughter    Healthy Son     Social History   Socioeconomic History   Marital status: Married    Spouse name: Not on file   Number of children: 2   Years of education: Masters   Highest education level: Not on file  Occupational History   Occupation: retired  Tobacco Use   Smoking status: Former    Packs/day: 1.50    Years: 5.00    Pack years: 7.50    Types: Cigarettes    Quit date: 1985    Years since quitting: 37.6   Smokeless tobacco: Never   Tobacco comments:    Quit 40+ years ago  Electronics engineer Use   Vaping Use: Never used  Substance and Sexual Activity   Alcohol use: No    Alcohol/week: 0.0 standard drinks   Drug use: No   Sexual activity: Not on file  Other Topics Concern   Not on file  Social History Narrative   He lives with his wife.   Retired Chief Financial Officer.   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   1-2 cups caffeine per day.   Social Determinants of Health   Financial Resource Strain: Not on file  Food Insecurity: Not on file  Transportation Needs: Not on file  Physical Activity: Not on file  Stress: Not on file  Social Connections: Not on file  Intimate Partner Violence: Not on file     REVIEW OF SYSTEMS:   '[X]'$  denotes positive finding, '[ ]'$  denotes negative finding Cardiac  Comments:  Chest pain or chest pressure:    Shortness of breath upon exertion:    Short of breath when lying flat:    Irregular heart rhythm:        Vascular    Pain in calf, thigh, or hip brought on by ambulation:    Pain in feet at night that wakes you up from your sleep:     Blood clot in your veins:    Leg swelling:         Pulmonary    Oxygen at home:    Wheezing:          Neurologic    Sudden weakness in arms or legs:     Sudden numbness in arms or legs:     Sudden onset of  difficulty speaking or understanding others    Temporary loss of vision in one eye:     Problems with dizziness:         Gastrointestinal    Blood in stool:     Vomited blood:         Genitourinary    Burning when urinating:     Blood in urine:        Psychiatric    Major depression:         Hematologic    Bleeding problems:    Problems with blood clotting too easily:        Skin    Rashes or ulcers:        Constitutional    Fever or chills:      PHYSICAL EXAMINATION:  Today's Vitals   07/25/21 0904 07/25/21 0905  BP: (!) 141/65 (!) 147/70  Pulse: (!) 49   Resp: 20   Temp: 98.6 F (37 C)   TempSrc: Temporal   SpO2: 100%   Weight: 204 lb 4.8 oz (92.7 kg)   Height: '5\' 9"'$  (1.753 m)    Body mass index is 30.17 kg/m.   General:  WDWN in NAD; vital signs documented above Gait: Not observed HENT: WNL, normocephalic Pulmonary: normal non-labored breathing  Cardiac: regular HR;  without carotid bruits Abdomen: soft, NT, aortic pulse is not palpable Skin: without rashes Vascular Exam/Pulses:  Right Left  Radial 2+ (normal) 2+ (normal)  Popliteal Unable to palpate Unable to palpate  DP Unable to palpate 2+ (normal)  PT 2+ (normal) Unable to palpate   Extremities: without ischemic changes, without Gangrene , without cellulitis; without open wounds;  Musculoskeletal: no muscle wasting or atrophy  Neurologic: A&O X 3;  speech is fluent/normal; moving all extremities equally  Psychiatric:  The pt has Normal affect.   Non-Invasive Vascular Imaging:   ABI's/TBI's on 07/25/2021: Right:  1.24/0.85 (T) - great toe pressure:  115 Left:  1.23/0.76  (T) - great toe pressure:  103   Arterial duplex on 07/25/2021: Right: No significant change compared to previous study. Non  hemodynamically significant heterogeneous plaque seen in the popliteal  artery. No  significant stenosis seen.   Non-Invasive Vascular Imaging:   Carotid Duplex on 07/25/2021: Right:  1-39% ICA stenosis Left:  1-39% ICA stenosis  Previous ABI's/TBI's on 02/14/2019: Right:  1.21/0.88 - great toe pressure:  127 Left:  1.19/0.70 - great toe pressure:  102  Previous BLE arterial duplex 02/14/2019: Summary:  Right: Normal examination. No evidence of arterial occlusive disease.  Left: Limited view of distal SFA and popliteal arteries showed no  hemodynamically significant stenosis  Previous Carotid duplex on 02/04/2018: Right: 1-39% ICA stenosis Left:   1-39% ICA stenosis Vertebrals:  Both vertebral arteries were patent with antegrade flow.  Subclavians: Normal flow hemodynamics were seen in bilateral subclavian arteries.    ASSESSMENT/PLAN:: 75 y.o. male here for follow up for PAD with hx of right popliteal endarterectomy 11/11/2017 by Dr. One[place] Alar   PAD -pt doing well with palpable right PT and left DP pulses.   -pt does not have rest pain, claudication, non healing wounds. -continue graduated walking program -pt will f/u in one year with ABI and RLE arterial duplex  Carotid stenosis -duplex today reveals 1-39% bilateral ICA stenosis.  He remains asymptomatic.   -discussed s/s of stroke/TIA and if pt develops any sx, they know to call 911 or go to the emergency room -given he has had 1-39% bilateral ICA stenosis  for several years and in 2019, Dr. Oneida Alar felt that he did not need further u/s, discussed with pt that he does not need carotid u/s unless he has any symptoms.    -continue asa (has stopped statin due to myalgia)   Leontine Locket, [facility] [phone]  Clinic MD:   Scot Dock

## 2021-08-16 ENCOUNTER — Telehealth: Payer: Self-pay

## 2021-08-16 NOTE — Telephone Encounter (Signed)
REFERRAL ON FILE FROM DR Bartholome Bill [phone]

## 2021-09-10 LAB — CBC AND DIFFERENTIAL
HCT: 45 (ref 41–53)
Hemoglobin: 14.7 (ref 13.5–17.5)
Platelets: 206 (ref 150–399)
WBC: 4.4

## 2021-09-10 LAB — LIPID PANEL
Cholesterol: 221 — AB (ref 0–200)
HDL: 43 (ref 35–70)
LDL Cholesterol: 144
Triglycerides: 171 — AB (ref 40–160)

## 2021-09-10 LAB — HEPATIC FUNCTION PANEL
ALT: 21 (ref 10–40)
AST: 28 (ref 14–40)
Alkaline Phosphatase: 49 (ref 25–125)
Bilirubin, Total: 1.4

## 2021-09-10 LAB — COMPREHENSIVE METABOLIC PANEL
Albumin: 4.2 (ref 3.5–5.0)
Calcium: 9.2 (ref 8.7–10.7)

## 2021-09-10 LAB — BASIC METABOLIC PANEL
BUN: 17 (ref 4–21)
CO2: 29 — AB (ref 13–22)
Chloride: 103 (ref 99–108)
Creatinine: 0.8 (ref 0.6–1.3)
Glucose: 100
Potassium: 5 (ref 3.4–5.3)
Sodium: 137 (ref 137–147)

## 2021-09-10 LAB — HEMOGLOBIN A1C: Hemoglobin A1C: 5.6

## 2021-09-10 LAB — TSH: TSH: 2.32 (ref 0.41–5.90)

## 2021-09-10 LAB — VITAMIN B12: Vitamin B-12: 669

## 2021-10-07 ENCOUNTER — Telehealth: Payer: Self-pay | Admitting: *Deleted

## 2021-10-07 NOTE — Telephone Encounter (Signed)
   Pre-operative Risk Assessment    Patient Name: Willie Burton  DOB: 14-Jan-1946 MRN: [id number]      Request for Surgical Clearance   Procedure:   RIGHT [facility] ARTHROPLASTY  Date of Surgery: Clearance 12/03/21                                 Surgeon:  DR. Roseanne Kaufman Surgeon's Group or Practice Name:  [facility] Phone number:  [phone] Fax number:  [phone] ATTN: Orson Slick   Type of Clearance Requested: - Medical  - Pharmacy:  Hold Aspirin     Type of Anesthesia:   ANESTHESIA -BLOCK WITH IV SEDATION   Additional requests/questions:   Jiles Prows   10/07/2021, 4:27 PM

## 2021-10-08 ENCOUNTER — Ambulatory Visit (INDEPENDENT_AMBULATORY_CARE_PROVIDER_SITE_OTHER): Payer: Medicare Other | Admitting: Internal Medicine

## 2021-10-08 ENCOUNTER — Encounter: Payer: Self-pay | Admitting: Internal Medicine

## 2021-10-08 ENCOUNTER — Other Ambulatory Visit: Payer: Self-pay

## 2021-10-08 VITALS — BP 132/82 | HR 52 | Temp 97.6°F | Ht 69.0 in | Wt 204.0 lb

## 2021-10-08 DIAGNOSIS — I2510 Atherosclerotic heart disease of native coronary artery without angina pectoris: Secondary | ICD-10-CM | POA: Diagnosis not present

## 2021-10-08 DIAGNOSIS — R10817 Generalized abdominal tenderness: Secondary | ICD-10-CM

## 2021-10-08 DIAGNOSIS — R3129 Other microscopic hematuria: Secondary | ICD-10-CM | POA: Insufficient documentation

## 2021-10-08 DIAGNOSIS — S80812A Abrasion, left lower leg, initial encounter: Secondary | ICD-10-CM

## 2021-10-08 DIAGNOSIS — G720 Drug-induced myopathy: Secondary | ICD-10-CM

## 2021-10-08 DIAGNOSIS — I739 Peripheral vascular disease, unspecified: Secondary | ICD-10-CM

## 2021-10-08 DIAGNOSIS — T466X5A Adverse effect of antihyperlipidemic and antiarteriosclerotic drugs, initial encounter: Secondary | ICD-10-CM

## 2021-10-08 DIAGNOSIS — I6523 Occlusion and stenosis of bilateral carotid arteries: Secondary | ICD-10-CM

## 2021-10-08 DIAGNOSIS — Z23 Encounter for immunization: Secondary | ICD-10-CM | POA: Diagnosis not present

## 2021-10-08 DIAGNOSIS — E785 Hyperlipidemia, unspecified: Secondary | ICD-10-CM | POA: Diagnosis not present

## 2021-10-08 LAB — URINALYSIS, ROUTINE W REFLEX MICROSCOPIC
Bilirubin Urine: NEGATIVE
Hgb urine dipstick: NEGATIVE
Ketones, ur: NEGATIVE
Leukocytes,Ua: NEGATIVE
Nitrite: NEGATIVE
RBC / HPF: NONE SEEN (ref 0–?)
Specific Gravity, Urine: 1.025 (ref 1.000–1.030)
Total Protein, Urine: NEGATIVE
Urine Glucose: NEGATIVE
Urobilinogen, UA: 0.2 (ref 0.0–1.0)
WBC, UA: NONE SEEN (ref 0–?)
pH: 6 (ref 5.0–8.0)

## 2021-10-08 MED ORDER — PITAVASTATIN CALCIUM 1 MG PO TABS: 1.0000 mg | ORAL_TABLET | Freq: Every day | ORAL | 0 refills | Status: DC

## 2021-10-08 NOTE — Patient Instructions (Signed)

## 2021-10-08 NOTE — Progress Notes (Deleted)
Subjective:  Patient ID: Willie Burton, male    DOB: Jun 15, 1946  Age: 75 y.o. MRN: [id number]  CC: Abdominal Pain  This visit occurred during the SARS-CoV-2 public health emergency.  Safety protocols were in place, including screening questions prior to the visit, additional usage of staff PPE, and extensive cleaning of exam room while observing appropriate contact time as indicated for disinfecting solutions.    HPI Willie Burton presents for ***  History Willie Burton has a past medical history of Alcohol abuse, Arthritis, Coronary artery disease, Diverticula of colon, Dysrhythmia, Heart disease, High blood pressure, High cholesterol, Hyperlipemia, Peripheral vascular disease ([place]), and Prostate cancer ([place]).   He has a past surgical history that includes Ulnar nerve transposition (Left, January 2015); Cataract extraction; Appendectomy; Lipoma excision (Right); Metacarpophalangeal joint arthroplasty; Cardiac catheterization (N/A, 11/13/2015); ABDOMINAL AORTOGRAM W/LOWER EXTREMITY (N/A, 10/16/2017); Prostatectomy (2003); Colon surgery (2008); Endarterectomy popliteal (Right, 11/11/2017); Femoral-popliteal Bypass Graft (Right, 11/11/2017); Patch angioplasty (Right, 11/11/2017); and Colonoscopy with propofol (N/A, 01/06/2018).   His family history includes COPD in his mother; Healthy in his brother, daughter, and son; Throat cancer in his father.He reports that he quit smoking about 37 years ago. His smoking use included cigarettes. He has a 7.50 pack-year smoking history. He has never used smokeless tobacco. He reports that he does not drink alcohol and does not use drugs.  Outpatient Medications Prior to Visit  Medication Sig Dispense Refill   aspirin EC 81 MG tablet Take 81 mg daily by mouth.      cholecalciferol (VITAMIN D) 1000 units tablet Take 1,000 Units daily by mouth.     MAGNESIUM-OXIDE 400 (241.3 Mg) MG tablet Take 400 mg by mouth daily.  11   metoprolol tartrate (LOPRESSOR) 25 MG tablet  metoprolol tartrate 25 mg tablet     vitamin B-12 (CYANOCOBALAMIN) 1000 MCG tablet Take 1,000 mcg by mouth daily. Reported on 12/31/2015     No facility-administered medications prior to visit.    ROS Review of Systems  Objective:  BP 132/82 (BP Location: Right Arm, Patient Position: Sitting, Cuff Size: Large)   Pulse (!) 52   Temp 97.6 F (36.4 C) (Oral)   Ht 5\' 9"  (1.753 m)   Wt 204 lb (92.5 kg)   SpO2 99%   BMI 30.13 kg/m   Physical Exam  Lab Results  Component Value Date   WBC 4.4 09/10/2021   HGB 14.7 09/10/2021   HCT 45 09/10/2021   PLT 206 09/10/2021   GLUCOSE 122 (H) 11/12/2017   CHOL 221 (A) 09/10/2021   TRIG 171 (A) 09/10/2021   HDL 43 09/10/2021   LDLCALC 144 09/10/2021   ALT 21 09/10/2021   AST 28 09/10/2021   NA 137 09/10/2021   K 5.0 09/10/2021   CL 103 09/10/2021   CREATININE 0.8 09/10/2021   BUN 17 09/10/2021   CO2 29 (A) 09/10/2021   TSH 2.32 09/10/2021   INR 0.94 11/09/2017   HGBA1C 5.6 09/10/2021     Assessment & Plan:   Willie Burton was seen today for abdominal pain.  Diagnoses and all orders for this visit:  Abrasion of left lower extremity, initial encounter -     Tdap vaccine greater than or equal to 7yo IM  Generalized abdominal tenderness without rebound tenderness -     CT Abdomen Pelvis W Contrast; Future -     Urinalysis, Routine w reflex microscopic; Future -     Urinalysis, Routine w reflex microscopic  Hyperlipidemia LDL goal <70 -  Discontinue: Pitavastatin Calcium 1 MG TABS; Take 1 tablet (1 mg total) by mouth daily.  Other microscopic hematuria -     CT Abdomen Pelvis W Contrast; Future -     Urinalysis, Routine w reflex microscopic; Future -     Urinalysis, Routine w reflex microscopic  PAD (peripheral artery disease) (HCC)  Coronary artery disease involving native coronary artery of native heart without angina pectoris  Statin myopathy  I have discontinued Chun D. Scribner's Pitavastatin Calcium. I am also having  Willie Burton maintain his aspirin EC, vitamin B-12, cholecalciferol, MAGnesium-Oxide, and metoprolol tartrate.  Meds ordered this encounter  Medications   DISCONTD: Pitavastatin Calcium 1 MG TABS    Sig: Take 1 tablet (1 mg total) by mouth daily.    Dispense:  90 tablet    Refill:  0      Follow-up: Return in about 1 week (around 10/15/2021).  Scarlette Calico, MD

## 2021-10-08 NOTE — Progress Notes (Signed)
Subjective:  Patient ID: Willie Burton, male    DOB: 1946/11/07  Age: 75 y.o. MRN: [id number]  CC: Abdominal Pain  This visit occurred during the SARS-CoV-2 public health emergency.  Safety protocols were in place, including screening questions prior to the visit, additional usage of staff PPE, and extensive cleaning of exam room while observing appropriate contact time as indicated for disinfecting solutions.    HPI Willie Burton presents for concerns about abd pain and to establish.  He complains of a 4-day history of suprapubic abdominal pain.  He describes it as a pressure and a spasm.  He tells me that a urinalysis was done and there was a trace amount of blood.  He was treated for prostatitis with Bactrim DS and says the pain has decreased by 90%.  He has a history of CAD and PAD.  He has tried to statins and developed myalgias with elevated CPK levels.  He would like to try different statin.  History Chanceler has a past medical history of Alcohol abuse, Arthritis, Coronary artery disease, Diverticula of colon, Dysrhythmia, Heart disease, High blood pressure, High cholesterol, Hyperlipemia, Peripheral vascular disease ([place]), and Prostate cancer ([place]).   He has a past surgical history that includes Ulnar nerve transposition (Left, January 2015); Cataract extraction; Appendectomy; Lipoma excision (Right); Metacarpophalangeal joint arthroplasty; Cardiac catheterization (N/A, 11/13/2015); ABDOMINAL AORTOGRAM W/LOWER EXTREMITY (N/A, 10/16/2017); Prostatectomy (2003); Colon surgery (2008); Endarterectomy popliteal (Right, 11/11/2017); Femoral-popliteal Bypass Graft (Right, 11/11/2017); Patch angioplasty (Right, 11/11/2017); and Colonoscopy with propofol (N/A, 01/06/2018).   His family history includes COPD in his mother; Healthy in his brother, daughter, and son; Throat cancer in his father.He reports that he quit smoking about 37 years ago. His smoking use included cigarettes. He has a 7.50  pack-year smoking history. He has never used smokeless tobacco. He reports that he does not drink alcohol and does not use drugs.  Outpatient Medications Prior to Visit  Medication Sig Dispense Refill   aspirin EC 81 MG tablet Take 81 mg daily by mouth.      cholecalciferol (VITAMIN D) 1000 units tablet Take 1,000 Units daily by mouth.     MAGNESIUM-OXIDE 400 (241.3 Mg) MG tablet Take 400 mg by mouth daily.  11   metoprolol tartrate (LOPRESSOR) 25 MG tablet metoprolol tartrate 25 mg tablet     vitamin B-12 (CYANOCOBALAMIN) 1000 MCG tablet Take 1,000 mcg by mouth daily. Reported on 12/31/2015     No facility-administered medications prior to visit.    ROS Review of Systems  Constitutional:  Negative for chills, diaphoresis, fatigue and fever.  HENT: Negative.    Eyes: Negative.   Respiratory:  Negative for cough, chest tightness, shortness of breath and wheezing.   Cardiovascular:  Negative for chest pain, palpitations and leg swelling.  Gastrointestinal:  Positive for abdominal pain. Negative for blood in stool, constipation, diarrhea, nausea and vomiting.  Endocrine: Negative.   Genitourinary: Negative.  Negative for difficulty urinating, dysuria, flank pain, frequency, hematuria, penile discharge, penile swelling, scrotal swelling, testicular pain and urgency.  Musculoskeletal: Negative.   Skin: Negative.  Negative for color change and rash.  Neurological: Negative.  Negative for dizziness, weakness and headaches.  Hematological:  Negative for adenopathy. Does not bruise/bleed easily.  Psychiatric/Behavioral: Negative.     Objective:  BP 132/82 (BP Location: Right Arm, Patient Position: Sitting, Cuff Size: Large)   Pulse (!) 52   Temp 97.6 F (36.4 C) (Oral)   Ht 5\' 9"  (1.753 m)   Wt  204 lb (92.5 kg)   SpO2 99%   BMI 30.13 kg/m   Physical Exam Vitals reviewed.  Constitutional:      General: He is not in acute distress.    Appearance: He is not ill-appearing,  toxic-appearing or diaphoretic.  HENT:     Nose: Nose normal.     Mouth/Throat:     Mouth: Mucous membranes are moist.  Eyes:     General: No scleral icterus.    Conjunctiva/sclera: Conjunctivae normal.  Cardiovascular:     Rate and Rhythm: Normal rate and regular rhythm.     Pulses:          Dorsalis pedis pulses are 1+ on the right side and 1+ on the left side.       Posterior tibial pulses are 1+ on the right side and 1+ on the left side.     Heart sounds: No murmur heard. Pulmonary:     Effort: Pulmonary effort is normal.     Breath sounds: No stridor. No wheezing, rhonchi or rales.  Abdominal:     General: Abdomen is flat. Bowel sounds are normal. There is no distension.     Palpations: Abdomen is soft. There is no hepatomegaly, splenomegaly or mass.     Tenderness: There is abdominal tenderness in the right lower quadrant, suprapubic area and left lower quadrant. There is no guarding or rebound.     Hernia: No hernia is present. There is no hernia in the left inguinal area or right inguinal area.  Genitourinary:    Penis: Normal and circumcised.      Testes: Normal.     Epididymis:     Right: Normal.     Left: Normal.     Prostate: Normal. Not enlarged, not tender and no nodules present.     Rectum: Normal. Guaiac result negative. No mass, tenderness, anal fissure, external hemorrhoid or internal hemorrhoid. Normal anal tone.  Musculoskeletal:     Cervical back: Neck supple.       Legs:  Feet:     Right foot:     Skin integrity: Skin integrity normal.     Toenail Condition: Right toenails are normal.     Left foot:     Skin integrity: Skin integrity normal.     Toenail Condition: Left toenails are normal.  Lymphadenopathy:     Cervical: No cervical adenopathy.     Lower Body: No right inguinal adenopathy. No left inguinal adenopathy.  Neurological:     Mental Status: He is alert.    Lab Results  Component Value Date   WBC 4.4 09/10/2021   HGB 14.7 09/10/2021    HCT 45 09/10/2021   PLT 206 09/10/2021   GLUCOSE 122 (H) 11/12/2017   CHOL 221 (A) 09/10/2021   TRIG 171 (A) 09/10/2021   HDL 43 09/10/2021   LDLCALC 144 09/10/2021   ALT 21 09/10/2021   AST 28 09/10/2021   NA 137 09/10/2021   K 5.0 09/10/2021   CL 103 09/10/2021   CREATININE 0.8 09/10/2021   BUN 17 09/10/2021   CO2 29 (A) 09/10/2021   TSH 2.32 09/10/2021   INR 0.94 11/09/2017   HGBA1C 5.6 09/10/2021    No results found.   Assessment & Plan:   Jahel was seen today for abdominal pain.  Diagnoses and all orders for this visit:  Abrasion of left lower extremity, initial encounter -     Tdap vaccine greater than or equal to 7yo IM  Generalized  abdominal tenderness without rebound tenderness- Based on his symptoms, exam, urinalysis, and CT scan I think that he has had an infection in the prostatic space.  He is status post prostatectomy.  I recommended that he complete the course of Bactrim DS. -     CT Abdomen Pelvis W Contrast; Future -     Urinalysis, Routine w reflex microscopic; Future -     Urinalysis, Routine w reflex microscopic  Hyperlipidemia LDL goal <70- I recommended that he try pitavastatin but he tells me it is too expensive. -     Discontinue: Pitavastatin Calcium 1 MG TABS; Take 1 tablet (1 mg total) by mouth daily.  Other microscopic hematuria- See above. -     CT Abdomen Pelvis W Contrast; Future -     Urinalysis, Routine w reflex microscopic; Future -     Urinalysis, Routine w reflex microscopic  PAD (peripheral artery disease) (HCC)  Coronary artery disease involving native coronary artery of native heart without angina pectoris  Statin myopathy- He does not tolerate statins.  I have discontinued Deloy D. Moder's Pitavastatin Calcium. I am also having him maintain his aspirin EC, vitamin B-12, cholecalciferol, MAGnesium-Oxide, and metoprolol tartrate.  Meds ordered this encounter  Medications   DISCONTD: Pitavastatin Calcium 1 MG TABS    Sig:  Take 1 tablet (1 mg total) by mouth daily.    Dispense:  90 tablet    Refill:  0      Follow-up: Return in about 1 week (around 10/15/2021).  Scarlette Calico, MD

## 2021-10-09 ENCOUNTER — Ambulatory Visit
Admission: RE | Admit: 2021-10-09 | Discharge: 2021-10-09 | Disposition: A | Discharge status: Home / Self Care | Payer: Medicare Other | Source: Ambulatory Visit | Attending: Internal Medicine | Admitting: Internal Medicine

## 2021-10-09 ENCOUNTER — Encounter: Payer: Self-pay | Admitting: Internal Medicine

## 2021-10-09 DIAGNOSIS — R10817 Generalized abdominal tenderness: Secondary | ICD-10-CM

## 2021-10-09 DIAGNOSIS — I739 Peripheral vascular disease, unspecified: Secondary | ICD-10-CM | POA: Insufficient documentation

## 2021-10-09 DIAGNOSIS — R3129 Other microscopic hematuria: Secondary | ICD-10-CM

## 2021-10-09 DIAGNOSIS — T466X5A Adverse effect of antihyperlipidemic and antiarteriosclerotic drugs, initial encounter: Secondary | ICD-10-CM | POA: Insufficient documentation

## 2021-10-09 DIAGNOSIS — I2510 Atherosclerotic heart disease of native coronary artery without angina pectoris: Secondary | ICD-10-CM | POA: Insufficient documentation

## 2021-10-09 DIAGNOSIS — G720 Drug-induced myopathy: Secondary | ICD-10-CM | POA: Insufficient documentation

## 2021-10-09 MED ORDER — IOPAMIDOL (ISOVUE-370) INJECTION 76%
75.0000 mL | Freq: Once | INTRAVENOUS | Status: AC | PRN
  Administered 2021-10-09: 75 mL via INTRAVENOUS

## 2021-10-09 NOTE — Telephone Encounter (Signed)
Patient has never been seen by our office. Looks like he was previously followed by Dr. Ubaldo Glassing at [facility]. However, he is scheduled for a New Patient visit with Dr. Marlou Porch later this month on 10/30/2021. Therefore, pre-op risk assessment can be addressed at that time. Will route clearance form to Dr. Marlou Porch so that he is aware and will add "pre-op eval" to appointment notes. Will remove from pre-op pool.  Darreld Mclean, PA-C 10/09/2021 9:30 AM

## 2021-10-17 ENCOUNTER — Other Ambulatory Visit: Payer: Self-pay

## 2021-10-17 ENCOUNTER — Encounter: Payer: Self-pay | Admitting: Internal Medicine

## 2021-10-17 ENCOUNTER — Ambulatory Visit (INDEPENDENT_AMBULATORY_CARE_PROVIDER_SITE_OTHER): Payer: Medicare Other | Admitting: Internal Medicine

## 2021-10-17 VITALS — BP 136/74 | HR 60 | Temp 98.0°F | Resp 16 | Ht 69.0 in | Wt 204.0 lb

## 2021-10-17 DIAGNOSIS — I10 Essential (primary) hypertension: Secondary | ICD-10-CM

## 2021-10-17 DIAGNOSIS — R3129 Other microscopic hematuria: Secondary | ICD-10-CM | POA: Diagnosis not present

## 2021-10-17 DIAGNOSIS — I6523 Occlusion and stenosis of bilateral carotid arteries: Secondary | ICD-10-CM

## 2021-10-17 DIAGNOSIS — Z23 Encounter for immunization: Secondary | ICD-10-CM

## 2021-10-17 NOTE — Progress Notes (Signed)
Subjective:  Patient ID: Willie Burton, male    DOB: 1946/03/29  Age: 75 y.o. MRN: [id number]  CC: Hypertension and Abdominal Pain  This visit occurred during the SARS-CoV-2 public health emergency.  Safety protocols were in place, including screening questions prior to the visit, additional usage of staff PPE, and extensive cleaning of exam room while observing appropriate contact time as indicated for disinfecting solutions.    HPI Willie Burton presents for f/up -  He continues to complain of sharp lower abdominal and pelvic pain.  He also complains of weak urine stream and straining.  His recent CT scan was unremarkable.  He localizes the discomfort to his bladder.  I recently prescribed pitavastatin but he says it is too expensive.  He has an appointment with cardiology in 2 weeks to discuss his lipids.  Outpatient Medications Prior to Visit  Medication Sig Dispense Refill   aspirin EC 81 MG tablet Take 81 mg daily by mouth.      cholecalciferol (VITAMIN D) 1000 units tablet Take 1,000 Units daily by mouth.     ezetimibe (ZETIA) 10 MG tablet Take 10 mg by mouth daily.     MAGNESIUM-OXIDE 400 (241.3 Mg) MG tablet Take 400 mg by mouth daily.  11   metoprolol tartrate (LOPRESSOR) 25 MG tablet metoprolol tartrate 25 mg tablet     vitamin B-12 (CYANOCOBALAMIN) 1000 MCG tablet Take 1,000 mcg by mouth daily. Reported on 12/31/2015     No facility-administered medications prior to visit.    ROS Review of Systems  Constitutional:  Negative for diaphoresis and fatigue.  HENT: Negative.    Respiratory:  Negative for cough, chest tightness, shortness of breath and wheezing.   Cardiovascular:  Negative for chest pain, palpitations and leg swelling.  Gastrointestinal:  Positive for abdominal pain. Negative for diarrhea and nausea.  Genitourinary:  Positive for difficulty urinating. Negative for dysuria, flank pain, frequency, hematuria, penile swelling, scrotal swelling, testicular pain and  urgency.  Musculoskeletal:  Negative for arthralgias and myalgias.  Skin: Negative.  Negative for rash.  Neurological:  Negative for dizziness, weakness and headaches.  Hematological:  Negative for adenopathy. Does not bruise/bleed easily.  Psychiatric/Behavioral: Negative.     Objective:  BP 136/74 (BP Location: Left Arm, Patient Position: Sitting, Cuff Size: Large)   Pulse 60   Temp 98 F (36.7 C) (Oral)   Resp 16   Ht 5\' 9"  (1.753 m)   Wt 204 lb (92.5 kg)   SpO2 98%   BMI 30.13 kg/m   BP Readings from Last 3 Encounters:  10/17/21 136/74  10/08/21 132/82  07/25/21 (!) 147/70    Wt Readings from Last 3 Encounters:  10/17/21 204 lb (92.5 kg)  10/08/21 204 lb (92.5 kg)  07/25/21 204 lb 4.8 oz (92.7 kg)    Physical Exam Vitals reviewed.  HENT:     Nose: Nose normal.     Mouth/Throat:     Mouth: Mucous membranes are moist.  Eyes:     General: No scleral icterus.    Conjunctiva/sclera: Conjunctivae normal.  Cardiovascular:     Rate and Rhythm: Normal rate and regular rhythm.     Heart sounds: No murmur heard. Pulmonary:     Effort: Pulmonary effort is normal.     Breath sounds: No stridor. No wheezing, rhonchi or rales.  Abdominal:     General: Abdomen is flat.     Palpations: There is no mass.     Tenderness: There is no  abdominal tenderness. There is no guarding.     Hernia: No hernia is present.  Musculoskeletal:        General: Normal range of motion.     Cervical back: Neck supple.     Right lower leg: No edema.     Left lower leg: No edema.  Lymphadenopathy:     Cervical: No cervical adenopathy.  Skin:    General: Skin is warm and dry.  Neurological:     General: No focal deficit present.     Mental Status: He is alert.  Psychiatric:        Mood and Affect: Mood normal.        Behavior: Behavior normal.    Lab Results  Component Value Date   WBC 4.4 09/10/2021   HGB 14.7 09/10/2021   HCT 45 09/10/2021   PLT 206 09/10/2021   GLUCOSE 122 (H)  11/12/2017   CHOL 221 (A) 09/10/2021   TRIG 171 (A) 09/10/2021   HDL 43 09/10/2021   LDLCALC 144 09/10/2021   ALT 21 09/10/2021   AST 28 09/10/2021   NA 137 09/10/2021   K 5.0 09/10/2021   CL 103 09/10/2021   CREATININE 0.8 09/10/2021   BUN 17 09/10/2021   CO2 29 (A) 09/10/2021   TSH 2.32 09/10/2021   INR 0.94 11/09/2017   HGBA1C 5.6 09/10/2021    CT Abdomen Pelvis W Contrast  Result Date: 10/09/2021 CLINICAL DATA:  Generalized abdominal tenderness without rebound tenderness. EXAM: CT ABDOMEN AND PELVIS WITH CONTRAST TECHNIQUE: Multidetector CT imaging of the abdomen and pelvis was performed using the standard protocol following bolus administration of intravenous contrast. CONTRAST:  63mL ISOVUE-370 IOPAMIDOL (ISOVUE-370) INJECTION 76% COMPARISON:  None. FINDINGS: Lower chest: Pleural-based densities in the left lower lobe are suggestive for scarring or atelectasis. No pleural effusions. Coronary artery calcifications. Hepatobiliary: Normal appearance of the liver, gallbladder and portal venous system. Pancreas: Unremarkable. No pancreatic ductal dilatation or surrounding inflammatory changes. Spleen: Normal in size without focal abnormality. Adrenals/Urinary Tract: Normal adrenal glands. Low-density structures in left kidney are suggestive for renal cysts. No hydronephrosis. No suspicious renal lesions. Normal appearance of the urinary bladder. Stomach/Bowel: Multiple colonic diverticula without acute inflammatory changes. Normal appearance of the stomach. No bowel distention. Vascular/Lymphatic: Atherosclerotic calcifications in the abdominal aorta without aneurysm. No significant lymph node enlargement in the abdomen or pelvis. Reproductive: Prostatectomy. Other: Negative for ascites.  Negative for free air. Musculoskeletal: Surgical clips in the deep soft tissues of the proximal right thigh. Multilevel disc space narrowing in the lumbar spine. Median sternotomy wires. IMPRESSION: 1. No acute  abnormality in the abdomen or pelvis. 2. Colonic diverticulosis.  No acute bowel inflammation. 3.  Aortic Atherosclerosis (ICD10-I70.0). 4. Prostatectomy. Electronically Signed   By: Markus Daft M.D.   On: 10/09/2021 14:58    Assessment & Plan:   Dent was seen today for hypertension and abdominal pain.  Diagnoses and all orders for this visit:  Other microscopic hematuria -     Ambulatory referral to Urology  Need for pneumococcal vaccination -     Pneumococcal conjugate vaccine 20-valent (Prevnar 20)  Primary hypertension- His blood pressure is adequately well controlled.  I am having Willie Burton maintain his aspirin EC, vitamin B-12, cholecalciferol, MAGnesium-Oxide, metoprolol tartrate, and ezetimibe.  No orders of the defined types were placed in this encounter.    Follow-up: Return in about 6 months (around 04/16/2022).  Scarlette Calico, MD

## 2021-10-17 NOTE — Patient Instructions (Signed)

## 2021-10-23 ENCOUNTER — Ambulatory Visit: Payer: Medicare Other | Admitting: Internal Medicine

## 2021-10-30 ENCOUNTER — Encounter: Payer: Self-pay | Admitting: Cardiology

## 2021-10-30 ENCOUNTER — Ambulatory Visit (INDEPENDENT_AMBULATORY_CARE_PROVIDER_SITE_OTHER): Payer: Medicare Other | Admitting: Cardiology

## 2021-10-30 ENCOUNTER — Other Ambulatory Visit: Payer: Self-pay

## 2021-10-30 VITALS — BP 128/82 | HR 53 | Ht 69.0 in | Wt 205.0 lb

## 2021-10-30 DIAGNOSIS — G720 Drug-induced myopathy: Secondary | ICD-10-CM | POA: Diagnosis not present

## 2021-10-30 DIAGNOSIS — I6523 Occlusion and stenosis of bilateral carotid arteries: Secondary | ICD-10-CM

## 2021-10-30 DIAGNOSIS — Z01810 Encounter for preprocedural cardiovascular examination: Secondary | ICD-10-CM | POA: Diagnosis not present

## 2021-10-30 DIAGNOSIS — I10 Essential (primary) hypertension: Secondary | ICD-10-CM

## 2021-10-30 DIAGNOSIS — E785 Hyperlipidemia, unspecified: Secondary | ICD-10-CM

## 2021-10-30 DIAGNOSIS — I2510 Atherosclerotic heart disease of native coronary artery without angina pectoris: Secondary | ICD-10-CM

## 2021-10-30 DIAGNOSIS — T466X5A Adverse effect of antihyperlipidemic and antiarteriosclerotic drugs, initial encounter: Secondary | ICD-10-CM

## 2021-10-30 DIAGNOSIS — Z01818 Encounter for other preprocedural examination: Secondary | ICD-10-CM

## 2021-10-30 MED ORDER — ROSUVASTATIN CALCIUM 5 MG PO TABS: 5.0000 mg | ORAL_TABLET | Freq: Every day | ORAL | 3 refills | Status: DC

## 2021-10-30 NOTE — Assessment & Plan Note (Signed)
Previous discomfort with Crestor.  Able to tolerate pravastatin in the past.

## 2021-10-30 NOTE — Assessment & Plan Note (Signed)
Blood pressures been under fairly good control.  On metoprolol 25 mg twice a day.  Continue diet, exercise.  Also on Flomax 0.4 mg which can affect blood pressure as well.  Dr. Ronnald Ramp has been monitoring.

## 2021-10-30 NOTE — Assessment & Plan Note (Addendum)
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  Doing well.  Holter monitor has been used in the past without any significant arrhythmia.  Continue to monitor with goal-directed medical therapy.  Aspirin 81 mg.  Currently on Zetia 10 mg a day.  Not on Crestor because of previous leg pain.  No anginal symptoms.  Doing well.

## 2021-10-30 NOTE — Assessment & Plan Note (Signed)
He may proceed with wrist surgery with low overall cardiac risk.  He is not having any anginal symptoms no arrhythmias no shortness of breath.  If possible, perform the surgery while he is taking his aspirin 81 mg given his prior bypass history.  If it is absolutely necessary to stop the aspirin 5 days prior to surgery because of excessive bleeding risks than this is okay.  No further cardiac testing warranted at this time.  He is able to complete greater than 4 METS of activity.

## 2021-10-30 NOTE — Patient Instructions (Signed)
Medication Instructions:  Your physician has recommended you make the following change in your medication:   START Crestor 5 mg taking 1 daily   *If you need a refill on your cardiac medications before your next appointment, please call your pharmacy*   Lab Work: None ordered  If you have labs (blood work) drawn today and your tests are completely normal, you will receive your results only by: [facility] (if you have MyChart) OR A paper copy in the mail If you have any lab test that is abnormal or we need to change your treatment, we will call you to review the results.   Testing/Procedures: None ordered  You have been referred to  Lipid Clinic  Follow-Up: At [facility], you and your health needs are our priority.  As part of our continuing mission to provide you with exceptional heart care, we have created designated Provider Care Teams.  These Care Teams include your primary Cardiologist (physician) and Advanced Practice Providers (APPs -  Physician Assistants and Nurse Practitioners) who all work together to provide you with the care you need, when you need it.  We recommend signing up for the patient portal called "MyChart".  Sign up information is provided on this After Visit Summary.  MyChart is used to connect with patients for Virtual Visits (Telemedicine).  Patients are able to view lab/test results, encounter notes, upcoming appointments, etc.  Non-urgent messages can be sent to your provider as well.   To learn more about what you can do with MyChart, go to [email or web address].    Your next appointment:   6 month(s)  The format for your next appointment:   In Person  Provider:   Robbie Lis, PA-C, Nicholes Rough, PA-C, Melina Copa, PA-C, Cecilie Kicks, NP, Ermalinda Barrios, PA-C, Christen Bame, NP, or Richardson Dopp, PA-C         Other Instructions

## 2021-10-30 NOTE — Assessment & Plan Note (Addendum)
Previously had trouble with pravastatin 20 mg a day as well as Crestor.  He has been off of the statins for quite some time and still does experience some leg discomfort.  I explained to him that his leg discomfort may not be all from statin therapy.  In fact, we will have him sit down with her lipid clinic.  Perhaps a PCSK9 inhibitor will be needed.  Dr. Ronnald Ramp note reviewed.  LDL goal less than 70.  Currently 144.  In the meantime, he is willing to try Crestor 5 mg once a day.  In fact we can try it 5 mg every other day at the beginning.  He is also on Zetia 10 mg a day.  We will continue with that.

## 2021-10-30 NOTE — Progress Notes (Signed)
Cardiology Office Note:    Date:  10/30/2021   ID:  Willie Burton, DOB [birth year], MRN [id number]  PCP:  Willie Lima, MD   Willie [facility] Cardiologist:  None     Referring MD: Willie Spray, MD   History of Present Illness:    Willie Burton is a 75 y.o. male here for the evaluation of hypertension and pre-op clearance at the request of Dr. Bartholome Burton.  He saw Dr. Ubaldo Burton 04/25/2021 for follow-up. At that visit his hypertension was controlled, and his LDL was 62 while on Crestor. Crestor was subsequently discontinued due to leg pain. Pravastatin was restarted at 20 mg daily. It was noted that [place] was moving to the [place] area and will follow up with [facility] cardiology.   On 11/20/2015 he underwent [facility] (Willie Burton, Willie Burton, Willie Burton).  Today: Overall, he is doing well. He states he needs some help with his lipid management. After restarting pravastatin, he again developed pain in his lower extremities so he stopped taking pravastatin. However, he has noticed that he again is developing similar leg pains while off of any statin. He notes his cholesterol was well controlled when he was on 5 mg Crestor.  He notes that he feels PVC's and PAC's frequently at baseline. They are unsettling but he knows they are benign.  Recently Zetia was added. Normally he will avoid red meats, but may have ice cream occasionally.  He is scheduled to have a right [facility] arthroplasty on 12/03/21.  He is a retired Chief Financial Officer.  He denies any chest pain, or shortness of breath. No lightheadedness, headaches, syncope, orthopnea, PND, lower extremity edema or exertional symptoms.  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   Arthritis    Coronary artery disease    Diverticula of colon    Dysrhythmia    PACs, PVCs   Heart disease    High blood pressure    High cholesterol    Hyperlipemia    Peripheral vascular disease (HCC)    Prostate  cancer [facility])     Past Surgical History:  Procedure Laterality Date   ABDOMINAL AORTOGRAM W/LOWER EXTREMITY N/A 10/16/2017   Procedure: ABDOMINAL AORTOGRAM W/LOWER EXTREMITY;  Surgeon: Willie Dutch, MD;  Location: [place] [facility];  Service: Cardiovascular;  Laterality: N/A;   APPENDECTOMY     CARDIAC CATHETERIZATION N/A 11/13/2015   Procedure: Left Heart Cath and Coronary Angiography;  Surgeon: Willie Spray, MD;  Location: [place] [facility];  Service: Cardiovascular;  Laterality: N/A;   CATARACT EXTRACTION     COLON SURGERY  2008   right colon resection   COLONOSCOPY WITH PROPOFOL N/A 01/06/2018   Procedure: COLONOSCOPY WITH PROPOFOL;  Surgeon: Willie Burton, [name], MD;  Location: [facility];  Service: Gastroenterology;  Laterality: N/A;   ENDARTERECTOMY POPLITEAL Right 11/11/2017   FEMORAL-POPLITEAL BYPASS GRAFT Right 11/11/2017   Procedure: RIGHT POPLITEAL ENDARTERECTOMY;  Surgeon: Willie Dutch, MD;  Location: [facility];  Service: Vascular;  Laterality: Right;   LIPOMA EXCISION Right    thigh   METACARPOPHALANGEAL JOINT ARTHROPLASTY     PATCH ANGIOPLASTY Right 11/11/2017   Procedure: PATCH ANGIOPLASTY of Superficial Fenoral Artery;  Surgeon: Willie Dutch, MD;  Location: [place];  Service: Vascular;  Laterality: Right;   PROSTATECTOMY  2003   ULNAR NERVE TRANSPOSITION Left January 2015    Current Medications: Current Meds  Medication Sig   aspirin EC  81 MG tablet Take 81 mg daily by mouth.    cholecalciferol (VITAMIN D) 1000 units tablet Take 1,000 Units daily by mouth.   ezetimibe (ZETIA) 10 MG tablet Take 10 mg by mouth daily.   MAGNESIUM-OXIDE 400 (241.3 Mg) MG tablet Take 400 mg by mouth daily.   metoprolol tartrate (LOPRESSOR) 25 MG tablet metoprolol tartrate 25 mg tablet   rosuvastatin (CRESTOR) 5 MG tablet Take 1 tablet (5 mg total) by mouth daily.   tamsulosin (FLOMAX) 0.4 MG CAPS capsule Take 0.4 mg by mouth daily.   vitamin B-12 (CYANOCOBALAMIN) 1000 MCG  tablet Take 1,000 mcg by mouth daily. Reported on 12/31/2015     Allergies:   Crestor [rosuvastatin calcium], Pravastatin sodium, Amoxicillin, Hepatitis a antigen, and Typhoid vaccines   Social History   Socioeconomic History   Marital status: Married    Spouse name: Not on file   Number of children: 2   Years of education: Masters   Highest education level: Not on file  Occupational History   Occupation: retired  Tobacco Use   Smoking status: Former    Packs/day: 1.50    Years: 5.00    Pack years: 7.50    Types: Cigarettes    Quit date: 1985    Years since quitting: 37.9   Smokeless tobacco: Never   Tobacco comments:    Quit 40+ years ago  Electronics engineer Use   Vaping Use: Never used  Substance and Sexual Activity   Alcohol use: No    Alcohol/week: 0.0 standard drinks   Drug use: No   Sexual activity: Not on file  Other Topics Concern   Not on file  Social History Narrative   He lives with his wife.   Retired Chief Financial Officer.   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   1-2 cups caffeine per day.   Social Determinants of Health   Financial Resource Strain: Not on file  Food Insecurity: Not on file  Transportation Needs: Not on file  Physical Activity: Not on file  Stress: Not on file  Social Connections: Not on file     Family History: The patient's family history includes COPD in his mother; Healthy in his brother, daughter, and son; Throat cancer in his father.  ROS:   Please see the history of present illness.    (+) Bilateral LE pain/aches (+) Palpitations All other systems reviewed and are negative.  EKGs/Labs/Other Studies Reviewed:    The following studies were reviewed today:  CT Abdomen/Pelvis 10/09/2021: FINDINGS: Lower chest: Pleural-based densities in the left lower lobe are suggestive for scarring or atelectasis. No pleural effusions. Coronary artery calcifications.   Hepatobiliary: Normal appearance of the liver, gallbladder  and portal venous system.   Pancreas: Unremarkable. No pancreatic ductal dilatation or surrounding inflammatory changes.   Spleen: Normal in size without focal abnormality.   Adrenals/Urinary Tract: Normal adrenal glands. Low-density structures in left kidney are suggestive for renal cysts. No hydronephrosis. No suspicious renal lesions. Normal appearance of the urinary bladder.   Stomach/Bowel: Multiple colonic diverticula without acute inflammatory changes. Normal appearance of the stomach. No bowel distention.   Vascular/Lymphatic: Atherosclerotic calcifications in the abdominal aorta without aneurysm. No significant lymph node enlargement in the abdomen or pelvis.   Reproductive: Prostatectomy.   Other: Negative for ascites.  Negative for free air.   Musculoskeletal: Surgical clips in the deep soft tissues of the proximal right thigh. Multilevel disc space narrowing in the lumbar spine. Median sternotomy wires.  IMPRESSION: 1. No acute abnormality in the abdomen or pelvis. 2. Colonic diverticulosis.  No acute bowel inflammation. 3.  Aortic Atherosclerosis (ICD10-I70.0). 4. Prostatectomy.  ABI 07/25/2021: Bilateral ABIs appear essentially unchanged compared to prior study on  02/14/2019.     Summary:  Right: Resting right ankle-brachial index is within normal range. No  evidence of significant right lower extremity arterial disease. The right  toe-brachial index is normal.   Left: Resting left ankle-brachial index is within normal range. No  evidence of significant left lower extremity arterial disease. The left  toe-brachial index is normal.   LE Arterial Duplex 07/25/2021: Summary:  Right: No significant change compared to previous study. Non  hemodynamically significant heterogeneous plaque seen in the popliteal  artery. No significant stenosis seen.   Bilateral Carotid Duplex 07/25/2021: Summary:  Right Carotid: Velocities in the right ICA are consistent  with a 1-39%  stenosis. Non-hemodynamically significant plaque <50% noted in the CCA.   Left Carotid: Velocities in the left ICA are consistent with a 1-39%  stenosis.   Vertebrals:  Bilateral vertebral arteries demonstrate antegrade flow.  Subclavians: Normal flow hemodynamics were seen in bilateral subclavian arteries.   EKG:  EKG is personally reviewed and interpreted. 10/30/2021: Sinus bradycardia. Rate 53 bpm. PVC.  Recent Labs: 09/10/2021: ALT 21; BUN 17; Creatinine 0.8; Hemoglobin 14.7; Platelets 206; Potassium 5.0; Sodium 137; TSH 2.32   Recent Lipid Panel    Component Value Date/Time   CHOL 221 (A) 09/10/2021 0000   TRIG 171 (A) 09/10/2021 0000   HDL 43 09/10/2021 0000   LDLCALC 144 09/10/2021 0000     Risk Assessment/Calculations:          Physical Exam:    VS:  BP 128/82   Pulse (!) 53   Ht 5\' 9"  (1.753 m)   Wt 205 lb (93 kg)   SpO2 99%   BMI 30.27 kg/m     Wt Readings from Last 3 Encounters:  10/30/21 205 lb (93 kg)  10/17/21 204 lb (92.5 kg)  10/08/21 204 lb (92.5 kg)     GEN: Well nourished, well developed in no acute distress HEENT: Normal NECK: No JVD; No carotid bruits LYMPHATICS: No lymphadenopathy CARDIAC: RRR, no murmurs, rubs, gallops RESPIRATORY:  Clear to auscultation without rales, wheezing or rhonchi  ABDOMEN: Soft, non-tender, non-distended MUSCULOSKELETAL:  No edema; No deformity  SKIN: Warm and dry NEUROLOGIC:  Alert and oriented x 3 PSYCHIATRIC:  Normal affect   ASSESSMENT:    1. Atherosclerosis of native coronary artery of native heart without angina pectoris   2. Primary hypertension   3. Statin myopathy   4. Hyperlipidemia LDL goal <70   5. Preoperative clearance   6. Preop cardiovascular exam    PLAN:    In order of problems listed above:  Atherosclerotic heart disease of native coronary artery without angina pectoris [facility] Burton to LAD SVG to OM1 SVG to PDA back in 2016.  Doing well.  Holter monitor has been  used in the past without any significant arrhythmia.  Continue to monitor with goal-directed medical therapy.  Aspirin 81 mg.  Currently on Zetia 10 mg a day.  Not on Crestor because of previous leg pain.  No anginal symptoms.  Doing well.  Primary hypertension Blood pressures been under fairly good control.  On metoprolol 25 mg twice a day.  Continue diet, exercise.  Also on Flomax 0.4 mg which can affect blood pressure as well.  Dr. Ronnald Ramp has been monitoring.  Statin myopathy Previous discomfort with Crestor.  Able to tolerate pravastatin in the past.  Hyperlipidemia LDL goal <70 Previously had trouble with pravastatin 20 mg a day as well as Crestor.  He has been off of the statins for quite some time and still does experience some leg discomfort.  I explained to him that his leg discomfort may not be all from statin therapy.  In fact, we will have him sit down with her lipid clinic.  Perhaps a PCSK9 inhibitor will be needed.  Dr. Ronnald Ramp note reviewed.  LDL goal less than 70.  Currently 144.  In the meantime, he is willing to try Crestor 5 mg once a day.  In fact we can try it 5 mg every other day at the beginning.  He is also on Zetia 10 mg a day.  We will continue with that.  Preop cardiovascular exam He may proceed with wrist surgery with low overall cardiac risk.  He is not having any anginal symptoms no arrhythmias no shortness of breath.  If possible, perform the surgery while he is taking his aspirin 81 mg given his prior bypass history.  If it is absolutely necessary to stop the aspirin 5 days prior to surgery because of excessive bleeding risks than this is okay.  No further cardiac testing warranted at this time.  He is able to complete greater than 4 METS of activity.  Extensive data review, prior notes from outside clinic reviewed, lab work reviewed.  Prior bypass surgery reviewed.  Imaging reviewed.  Follow-up: 6 months with APP. 1 year with me.  Medication Adjustments/Labs and Tests  Ordered: Current medicines are reviewed at length with the patient today.  Concerns regarding medicines are outlined above.   Orders Placed This Encounter  Procedures   AMB Referral to [facility] Lipid [facility] Clinic   EKG 12-Lead     Meds ordered this encounter  Medications   rosuvastatin (CRESTOR) 5 MG tablet    Sig: Take 1 tablet (5 mg total) by mouth daily.    Dispense:  90 tablet    Refill:  3     Patient Instructions  Medication Instructions:  Your physician has recommended you make the following change in your medication:   START Crestor 5 mg taking 1 daily   *If you need a refill on your cardiac medications before your next appointment, please call your pharmacy*   Lab Work: None ordered  If you have labs (blood work) drawn today and your tests are completely normal, you will receive your results only by: [employer] (if you have MyChart) OR A paper copy in the mail If you have any lab test that is abnormal or we need to change your treatment, we will call you to review the results.   Testing/Procedures: None ordered  You have been referred to  Lipid Clinic  Follow-Up: At [facility], you and your health needs are our priority.  As part of our continuing mission to provide you with exceptional heart care, we have created designated Provider Care Teams.  These Care Teams include your primary Cardiologist (physician) and [facility] (APPs -  Physician Assistants and Nurse Practitioners) who all work together to provide you with the care you need, when you need it.  We recommend signing up for the patient portal called "MyChart".  Sign up information is provided on this After Visit Summary.  MyChart is used to connect with patients for Virtual Visits (Telemedicine).  Patients are able to view  lab/test results, encounter notes, upcoming appointments, etc.  Non-urgent messages can be sent to your provider as well.   To learn more about what you  can do with MyChart, go to [email or web address].    Your next appointment:   6 month(s)  The format for your next appointment:   In Person  Provider:   Robbie Lis, PA-C, Nicholes Rough, PA-C, Melina Copa, PA-C, Cecilie Kicks, NP, Ermalinda Barrios, PA-C, Christen Bame, NP, or Richardson Dopp, PA-C         Other Instructions    I,Mathew Stumpf,acting as a scribe for Candee Furbish, MD.,have documented all relevant documentation on the behalf of Candee Furbish, MD,as directed by  Candee Furbish, MD while in the presence of Candee Furbish, MD.  I, Candee Furbish, MD, have reviewed all documentation for this visit. The documentation on 10/30/21 for the exam, diagnosis, procedures, and orders are all accurate and complete.   Signed, Candee Furbish, MD  10/30/2021 9:36 AM    [facility]

## 2021-11-18 NOTE — Progress Notes (Signed)
Patient ID: Willie Burton                 DOB: 19-May-1946                    MRN: [id number]     HPI: Willie Burton is a 75 y.o. male patient referred to lipid clinic by Dr. Marlou Porch. PMH is significant for CABG x3 11/20/2015, HTN, HLD, PVD, aortic atherosclerosis. LDL previously controlled at 62 mg/dL when on rosuvastatin 5 mg. He was switched to pravastatin due to leg pain which recurred with pravastatin, and he reported at his last visit having this similar leg pain while being off all statins. He was started on ezetimibe in November and at visit with Dr. Marlou Porch on 11/30, resumed rosuvastatin 5 mg daily.   Today, patient arrives in good spirits. Reports he has been doing well since resuming rosuvastatin. His leg pain is unchanged from when he was off statins so he thinks it is not statin-related. Also continues to do well with ezetimibe. Endorses a healthy diet. He has a surgery scheduled for the beginning of January. He reports plan to keep his current prescription insurance plan in the new year. He requests refills of rosuvastatin and metoprolol to be sent to [facility] but is okay on supply of ezetimibe at this time.   Current Medications: rosuvastatin 5 mg daily, ezetimibe 10 mg daily Intolerances: rosuvastatin 5 mg (leg pain), pravastatin 20 mg (leg pain), noted to have leg pain while off of these medications too Risk Factors: progressive ASCVD, HTN, HLD LDL goal: <55 mg/dL  Diet: Eats 3 meals/day Breakfast: oat cereal Lunch: low fat yogurt Dinner: chicken or salmon, occasionally beef or pork, vegetables Snacks: may have a snack late in the afternoon - nuts, cheese and crackers Drinks: lots of water  Exercise: 3 days at the gym doing weight lifting/stretching, regular walking  Family History: The patient's family history includes COPD in his mother; Healthy in his brother, daughter, and son; Throat cancer in his father.  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      cholecalciferol (VITAMIN D) 1000 units tablet Take 1,000 Units daily by mouth.     ezetimibe (ZETIA) 10 MG tablet Take 10 mg by mouth daily.     MAGNESIUM-OXIDE 400 (241.3 Mg) MG tablet Take 400 mg by mouth daily.  11   metoprolol tartrate (LOPRESSOR) 25 MG tablet metoprolol tartrate 25 mg tablet     rosuvastatin (CRESTOR) 5 MG tablet Take 1 tablet (5 mg total) by mouth daily. 90 tablet 3   tamsulosin (FLOMAX) 0.4 MG CAPS capsule Take 0.4 mg by mouth daily.     vitamin B-12 (CYANOCOBALAMIN) 1000 MCG tablet Take 1,000 mcg by  mouth daily. Reported on 12/31/2015     No current facility-administered medications on file prior to visit.    Allergies  Allergen Reactions   Crestor [Rosuvastatin Calcium] Other (See Comments)    myalgias   Pravastatin Sodium Other (See Comments)    Muscle aches    Amoxicillin Hives and Rash    Has patient had a PCN reaction causing immediate rash, facial/tongue/throat swelling, SOB or lightheadedness with hypotension: Yes Has patient had a PCN reaction causing severe rash involving mucus membranes or skin necrosis: No Has patient had a PCN reaction that required hospitalization: No Has patient had a PCN reaction occurring within the last 10 years: No If all of the above answers are "NO", then may proceed with Cephalosporin use.    Hepatitis A Antigen Rash   Typhoid Vaccines  Rash    Assessment/Plan:  1. Hyperlipidemia - Last LDL not at goal <55 mg/dL. Aiming for a stricter goal given progressive ASCVD (CABG + PVD). He is doing well since resuming rosuvastatin with no change in leg pain. Will continue rosuvastatin 5 mg daily and ezetimibe 10 mg daily. LDL was close to goal last year on rosuvastatin 5 mg alone. Will recheck a fasting lipid panel in January to assess whether LDL now at goal on this combination. If LDL close to but not at goal could consider increasing rosuvastatin to max tolerated dose before adding PCSK9i since leg pain seems independent of statin use. Encouraged him to keep up the good work with his healthy diet and exercise.    Rebbeca Paul, PharmD PGY2 Ambulatory Care Pharmacy Resident 11/19/2021 9:26 AM

## 2021-11-19 ENCOUNTER — Ambulatory Visit (INDEPENDENT_AMBULATORY_CARE_PROVIDER_SITE_OTHER): Payer: Medicare Other | Admitting: Student-PharmD

## 2021-11-19 ENCOUNTER — Other Ambulatory Visit: Payer: Self-pay

## 2021-11-19 DIAGNOSIS — I6523 Occlusion and stenosis of bilateral carotid arteries: Secondary | ICD-10-CM | POA: Diagnosis not present

## 2021-11-19 DIAGNOSIS — E785 Hyperlipidemia, unspecified: Secondary | ICD-10-CM | POA: Diagnosis not present

## 2021-11-19 MED ORDER — ROSUVASTATIN CALCIUM 5 MG PO TABS: 5.0000 mg | ORAL_TABLET | Freq: Every day | ORAL | 3 refills | Status: DC

## 2021-11-19 MED ORDER — METOPROLOL TARTRATE 25 MG PO TABS: 25.0000 mg | ORAL_TABLET | Freq: Two times a day (BID) | ORAL | 3 refills | Status: DC

## 2021-11-19 NOTE — Patient Instructions (Signed)
Nice to see you today!  Keep up the good work with diet and exercise. Aim for a diet full of vegetables, fruit and lean meats (chicken, [place], fish). Try to limit carbs (bread, pasta, sugar, rice) and red meat consumption.  Your goal LDL is less than 55 mg/dL, you're currently at 144 mg/dL  Medication Changes:  Continue rosuvastatin 5 mg daily and ezetimibe 10 mg daily  Please give [place] a call at [phone] with any questions or concerns.  We are checking fasting labs on Monday January 16th. You can come any time after 7:30am.

## 2021-12-16 ENCOUNTER — Other Ambulatory Visit: Payer: Medicare Other | Admitting: *Deleted

## 2021-12-16 ENCOUNTER — Other Ambulatory Visit: Payer: Self-pay

## 2021-12-16 DIAGNOSIS — E785 Hyperlipidemia, unspecified: Secondary | ICD-10-CM

## 2021-12-16 LAB — HEPATIC FUNCTION PANEL
ALT: 35 IU/L (ref 0–44)
AST: 32 IU/L (ref 0–40)
Albumin: 4.4 g/dL (ref 3.7–4.7)
Alkaline Phosphatase: 48 IU/L (ref 44–121)
Bilirubin Total: 1.7 mg/dL — ABNORMAL HIGH (ref 0.0–1.2)
Bilirubin, Direct: 0.32 mg/dL (ref 0.00–0.40)
Total Protein: 6.8 g/dL (ref 6.0–8.5)

## 2021-12-16 LAB — LIPID PANEL
Chol/HDL Ratio: 2.8 ratio (ref 0.0–5.0)
Cholesterol, Total: 113 mg/dL (ref 100–199)
HDL: 41 mg/dL (ref >39)
LDL Chol Calc (NIH): 53 mg/dL (ref 0–99)
Triglycerides: 102 mg/dL (ref 0–149)
VLDL Cholesterol Cal: 19 mg/dL (ref 5–40)

## 2021-12-18 ENCOUNTER — Encounter: Payer: Self-pay | Admitting: Cardiology

## 2021-12-20 MED ORDER — EZETIMIBE 10 MG PO TABS: 10.0000 mg | ORAL_TABLET | Freq: Every day | ORAL | 3 refills | Status: DC

## 2022-06-11 ENCOUNTER — Encounter: Payer: Self-pay | Admitting: Cardiology

## 2022-06-11 ENCOUNTER — Ambulatory Visit (INDEPENDENT_AMBULATORY_CARE_PROVIDER_SITE_OTHER): Payer: Medicare Other | Admitting: Cardiology

## 2022-06-11 DIAGNOSIS — I739 Peripheral vascular disease, unspecified: Secondary | ICD-10-CM

## 2022-06-11 DIAGNOSIS — I2510 Atherosclerotic heart disease of native coronary artery without angina pectoris: Secondary | ICD-10-CM

## 2022-06-11 DIAGNOSIS — I10 Essential (primary) hypertension: Secondary | ICD-10-CM | POA: Diagnosis not present

## 2022-06-11 DIAGNOSIS — E785 Hyperlipidemia, unspecified: Secondary | ICD-10-CM | POA: Diagnosis not present

## 2022-06-11 NOTE — Assessment & Plan Note (Signed)
Myalgias with pravastatin 20 as well as Crestor.   After being off of statins for quite some time still experience leg discomfort.  Previously described that leg discomfort may not be from statin therapy.  Saw a lipid clinic.  When he was on Crestor low-dose 5 mg and Zetia 10 mg his LDL was in the 50s.  Excellent.

## 2022-06-11 NOTE — Assessment & Plan Note (Addendum)
Metoprolol 25 mg twice a day.  Heart rate is a little bit slow however he is asymptomatic with this.  Obviously if symptoms do occur we can always pull back on the metoprolol.  Doing well.  No changes made to prescription drug management.  Lets continue.  Normally when he gives blood for instance his blood pressure has been under normal readings.  I have asked him to check a Om[employer] blood pressure cuff for some measurements at home.  It was a little bit high.  On repeat here it was 842 systolic.

## 2022-06-11 NOTE — Patient Instructions (Signed)
Medication Instructions:  Your physician recommends that you continue on your current medications as directed. Please refer to the Current Medication list given to you today.  *If you need a refill on your cardiac medications before your next appointment, please call your pharmacy*  Follow-Up: At [facility], you and your health needs are our priority.  As part of our continuing mission to provide you with exceptional heart care, we have created designated Provider Care Teams.  These Care Teams include your primary Cardiologist (physician) and Advanced Practice Providers (APPs -  Physician Assistants and Nurse Practitioners) who all work together to provide you with the care you need, when you need it.  We recommend signing up for the patient portal called "MyChart".  Sign up information is provided on this After Visit Summary.  MyChart is used to connect with patients for Virtual Visits (Telemedicine).  Patients are able to view lab/test results, encounter notes, upcoming appointments, etc.  Non-urgent messages can be sent to your provider as well.   To learn more about what you can do with MyChart, go to [email or web address].    Your next appointment:   1 year(s)  The format for your next appointment:   In Person  Provider:   Dr. Marlou Porch   :1}    Other Instructions [employer] Blood pressure cuff   Important Information About Sugar

## 2022-06-11 NOTE — Assessment & Plan Note (Signed)
Carotid ultrasound-mild bilaterally Right lower extremity plaque noted-unchanged from prior.  Vascular following.

## 2022-06-11 NOTE — Assessment & Plan Note (Signed)
11/20/2015 he underwent CABG x3 (LIMA-LAD, SVG-OM1, SVG-PDA).

## 2022-06-11 NOTE — Progress Notes (Signed)
Cardiology Office Note:    Date:  06/11/2022   ID:  Willie Burton, DOB 24-Jun-1946, MRN [id number]  PCP:  Merryl Hacker No   [facility] Providers Cardiologist:  Candee Furbish, MD     Referring MD: No ref. provider found    History of Present Illness:    Willie Burton is a 76 y.o. male here for follow-up coronary artery disease, hyperlipidemia, hypertension, PVCs.  11/20/2015 he underwent CABG x3 (LIMA-LAD, SVG-OM1, SVG-PDA).  Overall doing quite well.  Occasionally will feel PVCs or palpitations.  He has been taking his metoprolol 25 mg twice a day.  Heart rates at night with his Apple Watch sometimes dipped into the 40s.  Asymptomatic.  Not having any trouble with syncope.  After exercise, squats for instance he may feel lightheaded.  Discussed vasovagal/Valsalva response to this.  He is going to be careful.  Hydrate well.  Blood pressure was a little bit elevated today.  He will continue to monitor.  I have asked him to get a Omron blood pressure cuff.  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   Arthritis    Coronary artery disease    Diverticula of colon    Dysrhythmia    PACs, PVCs   Heart disease    High blood pressure    High cholesterol    Hyperlipemia    Peripheral vascular disease (HCC)    Prostate cancer [facility])     Past Surgical History:  Procedure Laterality Date   ABDOMINAL AORTOGRAM W/LOWER EXTREMITY N/A 10/16/2017   Procedure: ABDOMINAL AORTOGRAM W/LOWER EXTREMITY;  Surgeon: Elam Dutch, MD;  Location: [place] [facility];  Service: Cardiovascular;  Laterality: N/A;   APPENDECTOMY     CARDIAC CATHETERIZATION N/A 11/13/2015   Procedure: Left Heart Cath and Coronary Angiography;  Surgeon: Teodoro Spray, MD;  Location: [place] [facility];  Service: Cardiovascular;  Laterality: N/A;   CATARACT EXTRACTION     COLON SURGERY  2008   right colon resection   COLONOSCOPY WITH PROPOFOL N/A 01/06/2018   Procedure:  COLONOSCOPY WITH PROPOFOL;  Surgeon: Toledo, [name], MD;  Location: [facility] ENDOSCOPY;  Service: Gastroenterology;  Laterality: N/A;   ENDARTERECTOMY POPLITEAL Right 11/11/2017   FEMORAL-POPLITEAL BYPASS GRAFT Right 11/11/2017   Procedure: RIGHT POPLITEAL ENDARTERECTOMY;  Surgeon: Elam Dutch, MD;  [facility];  Service: Vascular;  Laterality: Right;   LIPOMA EXCISION Right    thigh   METACARPOPHALANGEAL JOINT ARTHROPLASTY     PATCH ANGIOPLASTY Right 11/11/2017   Procedure: PATCH ANGIOPLASTY of Superficial Fenoral Artery;  Surgeon: Elam Dutch, MD;  Location: [place];  Service: Vascular;  Laterality: Right;   PROSTATECTOMY  2003   ULNAR NERVE TRANSPOSITION Left January 2015    Current Medications: Current Meds  Medication Sig   aspirin EC 81 MG tablet Take 81 mg daily by mouth.    cholecalciferol (VITAMIN D) 1000 units tablet Take 1,000 Units daily by mouth.   ezetimibe (ZETIA) 10 MG tablet Take 1 tablet (10 mg total) by mouth daily.   MAGNESIUM-OXIDE 400 (241.3 Mg) MG tablet Take 400 mg by mouth daily.   metoprolol tartrate (LOPRESSOR) 25 MG tablet Take 1 tablet (25 mg total) by mouth 2 (two) times daily.   rosuvastatin (CRESTOR) 5 MG tablet Take 1 tablet (5 mg total) by mouth daily.   vitamin B-12 (CYANOCOBALAMIN) 1000 MCG tablet Take 1,000 mcg by mouth daily. Reported on 12/31/2015  Allergies:   Crestor [rosuvastatin calcium], Pravastatin sodium, Amoxicillin, Hepatitis a virus vaccine inactivated, and Typhoid vaccines   Social History   Socioeconomic History   Marital status: Married    Spouse name: Not on file   Number of children: 2   Years of education: Masters   Highest education level: Not on file  Occupational History   Occupation: retired  Tobacco Use   Smoking status: Former    Packs/day: 1.50    Years: 5.00    Total pack years: 7.50    Types: Cigarettes    Quit date: 1985    Years since quitting: 38.5   Smokeless tobacco: Never   Tobacco  comments:    Quit 40+ years ago  Electronics engineer Use   Vaping Use: Never used  Substance and Sexual Activity   Alcohol use: No    Alcohol/week: 0.0 standard drinks of alcohol   Drug use: No   Sexual activity: Not on file  Other Topics Concern   Not on file  Social History Narrative   He lives with his wife.   Retired Chief Financial Officer.   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   1-2 cups caffeine per day.   Social Determinants of Health   Financial Resource Strain: Not on file  Food Insecurity: Not on file  Transportation Needs: Not on file  Physical Activity: Not on file  Stress: Not on file  Social Connections: Not on file     Family History: The patient's family history includes COPD in his mother; Healthy in his brother, daughter, and son; Throat cancer in his father.  ROS:   Please see the history of present illness.     All other systems reviewed and are negative.  EKGs/Labs/Other Studies Reviewed:     Recent Labs: 09/10/2021: BUN 17; Creatinine 0.8; Hemoglobin 14.7; Platelets 206; Potassium 5.0; Sodium 137; TSH 2.32 12/16/2021: ALT 35  Recent Lipid Panel    Component Value Date/Time   CHOL 113 12/16/2021 0757   TRIG 102 12/16/2021 0757   HDL 41 12/16/2021 0757   CHOLHDL 2.8 12/16/2021 0757   LDLCALC 53 12/16/2021 0757     Risk Assessment/Calculations:              Physical Exam:    VS:  BP (!) 146/70   Pulse (!) 50   Ht '5\' 9"'$  (1.753 m)   Wt 195 lb 12.8 oz (88.8 kg)   SpO2 98%   BMI 28.91 kg/m     Wt Readings from Last 3 Encounters:  06/11/22 195 lb 12.8 oz (88.8 kg)  10/30/21 205 lb (93 kg)  10/17/21 204 lb (92.5 kg)     GEN:  Well nourished, well developed in no acute distress HEENT: Normal NECK: No JVD; No carotid bruits LYMPHATICS: No lymphadenopathy CARDIAC: brady reg, no murmurs, no rubs, gallops RESPIRATORY:  Clear to auscultation without rales, wheezing or rhonchi  ABDOMEN: Soft, non-tender,  non-distended MUSCULOSKELETAL:  No edema; No deformity  SKIN: Warm and dry NEUROLOGIC:  Alert and oriented x 3 PSYCHIATRIC:  Normal affect   ASSESSMENT:    1. Atherosclerosis of native coronary artery of native heart without angina pectoris   2. Hyperlipidemia LDL goal <70   3. PAD (peripheral artery disease) ([place])   4. Primary hypertension    PLAN:    In order of problems listed above:  Atherosclerotic heart disease of native coronary artery without angina pectoris 11/20/2015 he underwent CABG x3 (LIMA-LAD, SVG-OM1, SVG-PDA).  Hyperlipidemia LDL goal <70 Myalgias with pravastatin 20 as well as Crestor.   After being off of statins for quite some time still experience leg discomfort.  Previously described that leg discomfort may not be from statin therapy.  Saw a lipid clinic.  When he was on Crestor low-dose 5 mg and Zetia 10 mg his LDL was in the 50s.  Excellent.  PAD (peripheral artery disease) (HCC) Carotid ultrasound-mild bilaterally Right lower extremity plaque noted-unchanged from prior.  Vascular following.  Primary hypertension Metoprolol 25 mg twice a day.  Heart rate is a little bit slow however he is asymptomatic with this.  Obviously if symptoms do occur we can always pull back on the metoprolol.  Doing well.  No changes made to prescription drug management.  Lets continue.  Normally when he gives blood for instance his blood pressure has been under normal readings.  I have asked him to check a Omron blood pressure cuff for some measurements at home.  It was a little bit high.  On repeat here it was 417 systolic.         Medication Adjustments/Labs and Tests Ordered: Current medicines are reviewed at length with the patient today.  Concerns regarding medicines are outlined above.  No orders of the defined types were placed in this encounter.  No orders of the defined types were placed in this encounter.   Patient Instructions  Medication Instructions:   Your physician recommends that you continue on your current medications as directed. Please refer to the Current Medication list given to you today.  *If you need a refill on your cardiac medications before your next appointment, please call your pharmacy*  Follow-Up: At [facility], you and your health needs are our priority.  As part of our continuing mission to provide you with exceptional heart care, we have created designated Provider Care Teams.  These Care Teams include your primary Cardiologist (physician) and Advanced Practice Providers (APPs -  Physician Assistants and Nurse Practitioners) who all work together to provide you with the care you need, when you need it.  We recommend signing up for the patient portal called "MyChart".  Sign up information is provided on this After Visit Summary.  MyChart is used to connect with patients for Virtual Visits (Telemedicine).  Patients are able to view lab/test results, encounter notes, upcoming appointments, etc.  Non-urgent messages can be sent to your provider as well.   To learn more about what you can do with MyChart, go to [email or web address].    Your next appointment:   1 year(s)  The format for your next appointment:   In Person  Provider:   Dr. Marlou Porch   :1}    Other Instructions Omron Blood pressure cuff   Important Information About Sugar         Signed, Candee Furbish, MD  06/11/2022 9:36 AM    [place]

## 2022-10-27 ENCOUNTER — Encounter (HOSPITAL_BASED_OUTPATIENT_CLINIC_OR_DEPARTMENT_OTHER): Payer: Self-pay | Admitting: Cardiology

## 2022-10-27 NOTE — Telephone Encounter (Signed)
Routing to correct triage pool 

## 2022-10-27 NOTE — Telephone Encounter (Addendum)
Assisting in preop today. Do not see formal clearance request. Will route to callback to help get details and if patient needs to hold aspirin and how long especially since surgery is a week away. Will then need expedited virtual call to help clear for surgery. Had reviewed with Dr. Marlou Porch given CABG, PAD is outside periop guidelines and he said OK to hold ASA if doing well.

## 2022-10-28 ENCOUNTER — Ambulatory Visit: Payer: Medicare Other | Attending: Physician Assistant | Admitting: Physician Assistant

## 2022-10-28 ENCOUNTER — Telehealth: Payer: Self-pay | Admitting: Cardiology

## 2022-10-28 ENCOUNTER — Telehealth: Payer: Self-pay | Admitting: *Deleted

## 2022-10-28 DIAGNOSIS — Z01810 Encounter for preprocedural cardiovascular examination: Secondary | ICD-10-CM

## 2022-10-28 NOTE — Progress Notes (Signed)
Virtual Visit via Telephone Note   Because of Willie Burton's co-morbid illnesses, he is at least at moderate risk for complications without adequate follow up.  This format is felt to be most appropriate for this patient at this time.  The patient did not have access to video technology/had technical difficulties with video requiring transitioning to audio format only (telephone).  All issues noted in this document were discussed and addressed.  No physical exam could be performed with this format.  Please refer to the patient's chart for his consent to telehealth for [facility].  Evaluation Performed:  Preoperative cardiovascular risk assessment _____________   Date:  10/28/2022   Patient ID:  Willie Burton, DOB 24-Jul-1946, MRN [id number] Patient Location:  Home Provider location:   Office  Primary Care Provider:  Pcp, No Primary Cardiologist:  Candee Furbish, MD  Chief Complaint / Patient Profile   76 y.o. y/o male with a h/o CAD s/p CABG 2016, HTN, and HLD who is pending bilateral upper eyelid external blepharoptosis repair with direct brow lift bilateral upper eyelid blepharoplasty and presents today for telephonic preoperative cardiovascular risk assessment.  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   Arthritis    Coronary artery disease    Diverticula of colon    Dysrhythmia    PACs, PVCs   Heart disease    High blood pressure    High cholesterol    Hyperlipemia    Peripheral vascular disease ([place])    Prostate cancer [facility])    Past Surgical History:  Procedure Laterality Date   ABDOMINAL AORTOGRAM W/LOWER EXTREMITY N/A 10/16/2017   Procedure: ABDOMINAL AORTOGRAM W/LOWER EXTREMITY;  Surgeon: Elam Dutch, MD;  Location: [place] [facility] LAB;  Service: Cardiovascular;  Laterality: N/A;   APPENDECTOMY     CARDIAC CATHETERIZATION N/A 11/13/2015   Procedure: Left Heart Cath and Coronary  Angiography;  Surgeon: Teodoro Spray, MD;  Location: [place] [facility];  Service: Cardiovascular;  Laterality: N/A;   CATARACT EXTRACTION     COLON SURGERY  2008   right colon resection   COLONOSCOPY WITH PROPOFOL N/A 01/06/2018   Procedure: COLONOSCOPY WITH PROPOFOL;  Surgeon: Toledo, [name], MD;  Location: [facility] ENDOSCOPY;  Service: Gastroenterology;  Laterality: N/A;   ENDARTERECTOMY POPLITEAL Right 11/11/2017   FEMORAL-POPLITEAL BYPASS GRAFT Right 11/11/2017   Procedure: RIGHT POPLITEAL ENDARTERECTOMY;  Surgeon: Elam Dutch, MD;  Location: [facility] OR;  Service: Vascular;  Laterality: Right;   LIPOMA EXCISION Right    thigh   METACARPOPHALANGEAL JOINT ARTHROPLASTY     PATCH ANGIOPLASTY Right 11/11/2017   Procedure: PATCH ANGIOPLASTY of Superficial Fenoral Artery;  Surgeon: Elam Dutch, MD;  Location: [facility] OR;  Service: Vascular;  Laterality: Right;   PROSTATECTOMY  2003   ULNAR NERVE TRANSPOSITION Left January 2015    Allergies  Allergies  Allergen Reactions   Crestor [Rosuvastatin Calcium] Other (See Comments)    myalgias   Pravastatin Sodium Other (See Comments)    Muscle aches    Amoxicillin Hives and Rash    Has patient had a PCN reaction causing immediate rash, facial/tongue/throat swelling, SOB or lightheadedness with hypotension: Yes Has patient had a PCN reaction causing severe rash involving mucus membranes or skin necrosis: No Has patient had a PCN reaction that required hospitalization: No Has patient had a PCN reaction occurring within the last 10 years: No If all  of the above answers are "NO", then may proceed with Cephalosporin use.    Hepatitis A Virus Vaccine Inactivated Rash   Typhoid Vaccines Rash    History of Present Illness    Willie Burton is a 76 y.o. male who presents via audio/video conferencing for a telehealth visit today.  Pt was last seen in cardiology clinic on 06/11/22 by Dr. Marlou Porch.  At that time Willie Burton was doing well.  He  remains active and goes to the gym several times per week.  He can walk over 2 miles without angina.  The patient is now pending procedure as outlined above. Since his last visit, he has remained active and can complete more than 4.0 METS without angina.  No new cardiac symptoms.   Home Medications    Prior to Admission medications   Medication Sig Start Date End Date Taking? Authorizing Provider  aspirin EC 81 MG tablet Take 81 mg daily by mouth.     [provider]  cholecalciferol (VITAMIN D) 1000 units tablet Take 1,000 Units daily by mouth.    [provider]  ezetimibe (ZETIA) 10 MG tablet Take 1 tablet (10 mg total) by mouth daily. 12/20/21   Jerline Pain, MD  MAGNESIUM-OXIDE 400 (241.3 Mg) MG tablet Take 400 mg by mouth daily. 08/09/17   [provider]  metoprolol tartrate (LOPRESSOR) 25 MG tablet Take 1 tablet (25 mg total) by mouth 2 (two) times daily. 11/19/21   Jerline Pain, MD  rosuvastatin (CRESTOR) 5 MG tablet Take 1 tablet (5 mg total) by mouth daily. 11/19/21   Jerline Pain, MD  vitamin B-12 (CYANOCOBALAMIN) 1000 MCG tablet Take 1,000 mcg by mouth daily. Reported on 12/31/2015 10/30/14   [provider]    Physical Exam    Vital Signs:  Willie Burton does not have vital signs available for review today.  Given telephonic nature of communication, physical exam is limited. AAOx3. NAD. Normal affect.  Speech and respirations are unlabored.  Accessory Clinical Findings    None  Assessment & Plan    1.  Preoperative Cardiovascular Risk Assessment:  Patient has a history of CAD s/p CABG times 01/2015.  He remains active and can complete more than 4.0 METS without angina. According to the RCRI he has a 0.9% risk of MACE.  He is on aspirin monotherapy.  He has already stopped aspirin per the surgeons instructions.  I have asked that he restart this as soon as possible per the surgeon.  Therefore, based on ACC/AHA guidelines, the patient  would be at acceptable risk for the planned procedure without further cardiovascular testing.    The patient was advised that if he develops new symptoms prior to surgery to contact our office to arrange for a follow-up visit, and he verbalized understanding.  A copy of this note will be routed to requesting surgeon.  Time:   Today, I have spent 6 minutes with the patient with telehealth technology discussing medical history, symptoms, and management plan.     Tami Lin Pascual Mantel, PA  10/28/2022, 1:59 PM

## 2022-10-28 NOTE — Telephone Encounter (Signed)
  Patient Consent for Virtual Visit        Willie Burton has provided verbal consent on 10/28/2022 for a virtual visit (video or telephone).   CONSENT FOR VIRTUAL VISIT FOR:  Willie Burton  By participating in this virtual visit I agree to the following:  I hereby voluntarily request, consent and authorize [facility] and its employed or contracted physicians, physician assistants, nurse practitioners or other licensed health care professionals (the Practitioner), to provide me with telemedicine health care services (the "Services") as deemed necessary by the treating Practitioner. I acknowledge and consent to receive the Services by the Practitioner via telemedicine. I understand that the telemedicine visit will involve communicating with the Practitioner through live audiovisual communication technology and the disclosure of certain medical information by electronic transmission. I acknowledge that I have been given the opportunity to request an in-person assessment or other available alternative prior to the telemedicine visit and am voluntarily participating in the telemedicine visit.  I understand that I have the right to withhold or withdraw my consent to the use of telemedicine in the course of my care at any time, without affecting my right to future care or treatment, and that the Practitioner or I may terminate the telemedicine visit at any time. I understand that I have the right to inspect all information obtained and/or recorded in the course of the telemedicine visit and may receive copies of available information for a reasonable fee.  I understand that some of the potential risks of receiving the Services via telemedicine include:  Delay or interruption in medical evaluation due to technological equipment failure or disruption; Information transmitted may not be sufficient (e.g. poor resolution of images) to allow for appropriate medical decision making by the  Practitioner; and/or  In rare instances, security protocols could fail, causing a breach of personal health information.  Furthermore, I acknowledge that it is my responsibility to provide information about my medical history, conditions and care that is complete and accurate to the best of my ability. I acknowledge that Practitioner's advice, recommendations, and/or decision may be based on factors not within their control, such as incomplete or inaccurate data provided by me or distortions of diagnostic images or specimens that may result from electronic transmissions. I understand that the practice of medicine is not an exact science and that Practitioner makes no warranties or guarantees regarding treatment outcomes. I acknowledge that a copy of this consent can be made available to me via my patient portal ([facility]), or I can request a printed copy by calling the office of [facility].    I understand that my insurance will be billed for this visit.   I have read or had this consent read to me. I understand the contents of this consent, which adequately explains the benefits and risks of the Services being provided via telemedicine.  I have been provided ample opportunity to ask questions regarding this consent and the Services and have had my questions answered to my satisfaction. I give my informed consent for the services to be provided through the use of telemedicine in my medical care

## 2022-10-28 NOTE — Telephone Encounter (Signed)
   Pre-operative Risk Assessment    Patient Name: Willie Burton  DOB: September 06, 1946 MRN: [id number]      Request for Surgical Clearance    Procedure:   Bilateral upper eyelid external blepharoptosis repair with direct brow lift bilateral upper eyelid blepharoplasty   Date of Surgery:  Clearance 11/03/22                                 Surgeon:  Dr. [name]  Surgeon's Group or Practice Name:  [facility] Phone number:  [phone] Fax number:  [phone]   Type of Clearance Requested:   - Medical  - Pharmacy:  Hold Aspirin TBD by cardiology   Type of Anesthesia:  MAC   Additional requests/questions:    Willie Burton   10/28/2022, 11:58 AM

## 2022-10-29 NOTE — Telephone Encounter (Signed)
Addressed in [place]. Will remove from preop pool.   Loel Dubonnet, NP

## 2022-11-03 ENCOUNTER — Other Ambulatory Visit: Payer: Self-pay | Admitting: Ophthalmology

## 2022-11-11 ENCOUNTER — Other Ambulatory Visit: Payer: Self-pay

## 2022-11-11 DIAGNOSIS — I739 Peripheral vascular disease, unspecified: Secondary | ICD-10-CM

## 2022-11-12 ENCOUNTER — Other Ambulatory Visit: Payer: Self-pay | Admitting: Cardiology

## 2022-11-13 ENCOUNTER — Telehealth: Payer: Self-pay | Admitting: Cardiology

## 2022-11-13 DIAGNOSIS — Z79899 Other long term (current) drug therapy: Secondary | ICD-10-CM

## 2022-11-13 DIAGNOSIS — I10 Essential (primary) hypertension: Secondary | ICD-10-CM

## 2022-11-13 MED ORDER — OLMESARTAN MEDOXOMIL 20 MG PO TABS: 20.0000 mg | ORAL_TABLET | Freq: Every day | ORAL | 3 refills | Status: DC

## 2022-11-13 NOTE — Telephone Encounter (Signed)
Let's stop the metoprolol. Start olmesartan '20mg'$ . Check BMET in 2 weeks. Candee Furbish, MD    Pt is aware of above orders.  RX sent into pharmacy of choice.  BMP in 2 weeks ordered and scheduled.  Pt will continue to monitor his BP and call back with any questions or concerns.

## 2022-11-13 NOTE — Telephone Encounter (Signed)
Pt c/o BP issue: STAT if pt c/o blurred vision, one-sided weakness or slurred speech  1. What are your last 5 BP readings?   Hours after medication  12/14: 147/77  12/4: 144/61 11/27: 146/91 11/11: 139/80  10/21: 131/72    2. Are you having any other symptoms (ex. Dizziness, headache, blurred vision, passed out)? No symptoms    3. What is your BP issue? Pt states he doesn't think his bp medication is controlling is BP. Pt is still taking metoprolol 25 mg once daily and HCTZ - 12.5 mg once daily (prescribed by another dr of his) started around the beginning of August by PCP

## 2022-11-13 NOTE — Telephone Encounter (Signed)
Spoke with pt who is reporting BPs as above.   Heart rates have been 60 bpm or less.  40s over night when sleeping.  Pt's pcp started him on HCTZ 12.5 mg in August.  Pt feels as though the BP has actually gone up since starting this.  He is also still taking Metoprolol Tartrate 25 mg BID as ordered.  He is concerned due to his history.  Advised I will have Dr Marlou Porch reviewed and call back with any new orders.

## 2022-11-18 NOTE — Progress Notes (Addendum)
HISTORY AND PHYSICAL     CC:  follow up. Requesting Provider:  No ref. provider found  HPI: This is a 76 y.o. male who is here today for follow up for PAD.  Pt has hx of  right popliteal endarterectomy for claudication on 11/11/2017 by Dr. Oneida Alar.     Pt was last seen 07/25/2021 and at that time, he was doing well.  He was having some cramping in his thighs but once he stopped taking his statin, this resolved.  He was not having any rest pain, wounds or claudication.  He had moved from [place] ~ 15 years earlier to [facility] to be closer to family.  The pt returns today for follow up.  He states he is doing well.  He denies any claudication, rest pain, or non healing wounds.  He is compliant with his asa and statin.    Since his last visit, he has had an arthroplasty of the left thumb and blepharoplasty bilaterally.  He tells me he is from the [place] area and long time [place] [employer] fan.   The pt is on a statin for cholesterol management.   Pt tolerating Crestor. The pt is on an aspirin.    Other AC:  none The pt is on ARB for hypertension.  The pt does not have diabetes. Tobacco hx:  former  Pt does not have family hx of AAA.  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   Arthritis    Coronary artery disease    Diverticula of colon    Dysrhythmia    PACs, PVCs   Heart disease    High blood pressure    High cholesterol    Hyperlipemia    Peripheral vascular disease (HCC)    Prostate cancer [facility])     Past Surgical History:  Procedure Laterality Date   ABDOMINAL AORTOGRAM W/LOWER EXTREMITY N/A 10/16/2017   Procedure: ABDOMINAL AORTOGRAM W/LOWER EXTREMITY;  Surgeon: Elam Dutch, MD;  Location: [place] [facility];  Service: Cardiovascular;  Laterality: N/A;   APPENDECTOMY     CARDIAC CATHETERIZATION N/A 11/13/2015   Procedure: Left Heart Cath and Coronary Angiography;  Surgeon: Teodoro Spray, MD;  Location: [place] [facility];   Service: Cardiovascular;  Laterality: N/A;   CATARACT EXTRACTION     COLON SURGERY  2008   right colon resection   COLONOSCOPY WITH PROPOFOL N/A 01/06/2018   Procedure: COLONOSCOPY WITH PROPOFOL;  Surgeon: Toledo, [name], MD;  Location: [facility];  Service: Gastroenterology;  Laterality: N/A;   ENDARTERECTOMY POPLITEAL Right 11/11/2017   FEMORAL-POPLITEAL BYPASS GRAFT Right 11/11/2017   Procedure: RIGHT POPLITEAL ENDARTERECTOMY;  Surgeon: Elam Dutch, MD;  Location: [place];  Service: Vascular;  Laterality: Right;   LIPOMA EXCISION Right    thigh   METACARPOPHALANGEAL JOINT ARTHROPLASTY     PATCH ANGIOPLASTY Right 11/11/2017   Procedure: PATCH ANGIOPLASTY of Superficial Fenoral Artery;  Surgeon: Elam Dutch, MD;  Location: [place] OR;  Service: Vascular;  Laterality: Right;   PROSTATECTOMY  2003   ULNAR NERVE TRANSPOSITION Left January 2015    Allergies  Allergen Reactions   Crestor [Rosuvastatin Calcium] Other (See Comments)    myalgias   Pravastatin Sodium Other (See Comments)    Muscle aches    Amoxicillin Hives and Rash    Has patient had a PCN reaction causing immediate rash, facial/tongue/throat swelling, SOB or lightheadedness with hypotension: Yes Has patient had a PCN  reaction causing severe rash involving mucus membranes or skin necrosis: No Has patient had a PCN reaction that required hospitalization: No Has patient had a PCN reaction occurring within the last 10 years: No If all of the above answers are "NO", then may proceed with Cephalosporin use.    Hepatitis A Virus Vaccine Inactivated Rash   Typhoid Vaccines Rash    Current Outpatient Medications  Medication Sig Dispense Refill   aspirin EC 81 MG tablet Take 81 mg daily by mouth.      cholecalciferol (VITAMIN D) 1000 units tablet Take 1,000 Units daily by mouth.     ezetimibe (ZETIA) 10 MG tablet Take 1 tablet (10 mg total) by mouth daily. 90 tablet 3   MAGNESIUM-OXIDE 400 (241.3 Mg) MG tablet Take  400 mg by mouth daily.  11   olmesartan (BENICAR) 20 MG tablet Take 1 tablet (20 mg total) by mouth daily. 90 tablet 3   rosuvastatin (CRESTOR) 5 MG tablet Take 1 tablet (5 mg total) by mouth daily. 90 tablet 3   vitamin B-12 (CYANOCOBALAMIN) 1000 MCG tablet Take 1,000 mcg by mouth daily. Reported on 12/31/2015     No current facility-administered medications for this visit.    Family History  Problem Relation Age of Onset   COPD Mother        Deceased, 56   Throat cancer Father        Deceased, 41s   Healthy Brother    Healthy Daughter    Healthy Son     Social History   Socioeconomic History   Marital status: Married    Spouse name: Not on file   Number of children: 2   Years of education: Masters   Highest education level: Not on file  Occupational History   Occupation: retired  Tobacco Use   Smoking status: Former    Packs/day: 1.50    Years: 5.00    Total pack years: 7.50    Types: Cigarettes    Quit date: 1985    Years since quitting: 38.9   Smokeless tobacco: Never   Tobacco comments:    Quit 40+ years ago  Electronics engineer Use   Vaping Use: Never used  Substance and Sexual Activity   Alcohol use: No    Alcohol/week: 0.0 standard drinks of alcohol   Drug use: No   Sexual activity: Not on file  Other Topics Concern   Not on file  Social History Narrative   He lives with his wife.   Retired Chief Financial Officer.   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   1-2 cups caffeine per day.   Social Determinants of Health   Financial Resource Strain: Not on file  Food Insecurity: Not on file  Transportation Needs: Not on file  Physical Activity: Not on file  Stress: Not on file  Social Connections: Not on file  Intimate Partner Violence: Not on file     REVIEW OF SYSTEMS:   '[X]'$  denotes positive finding, '[ ]'$  denotes negative finding Cardiac  Comments:  Chest pain or chest pressure:    Shortness of breath upon exertion:    Short of breath when  lying flat:    Irregular heart rhythm:        Vascular    Pain in calf, thigh, or hip brought on by ambulation:    Pain in feet at night that wakes you up from your sleep:     Blood clot in your veins:  Leg swelling:         Pulmonary    Oxygen at home:    Productive cough:     Wheezing:         Neurologic    Sudden weakness in arms or legs:     Sudden numbness in arms or legs:     Sudden onset of difficulty speaking or slurred speech:    Temporary loss of vision in one eye:     Problems with dizziness:         Gastrointestinal    Blood in stool:     Vomited blood:         Genitourinary    Burning when urinating:     Blood in urine:        Psychiatric    Major depression:         Hematologic    Bleeding problems:    Problems with blood clotting too easily:        Skin    Rashes or ulcers:        Constitutional    Fever or chills:      PHYSICAL EXAMINATION:  Today's Vitals   11/19/22 1318  BP: 134/76  Pulse: 65  Resp: 20  Temp: 98.2 F (36.8 C)  TempSrc: Temporal  SpO2: 100%  Weight: 205 lb 3.2 oz (93.1 kg)  Height: '5\' 9"'$  (1.753 m)   Body mass index is 30.3 kg/m.   General:  WDWN in NAD; vital signs documented above Gait: Not observed HENT: WNL, normocephalic Pulmonary: normal non-labored breathing , without wheezing Cardiac: regular HR, without carotid bruits Abdomen: soft, NT; aortic pulse is not palpable Skin: without rashes Vascular Exam/Pulses: Brisk doppler flow right PT/pero>AT and brisk doppler flow left DP/PT>pero Unable to palpate popliteal pulses bilaterally Bilateral radial pulses are palpable Extremities: without ischemic changes, without Gangrene , without cellulitis; without open wounds Musculoskeletal: no muscle wasting or atrophy  Neurologic: A&O X 3 Psychiatric:  The pt has Normal affect.   Non-Invasive Vascular Imaging:   ABI's/TBI's on 11/19/2022: Right:  1.09/0.61 - Great toe pressure: 72 Left:  1.34/0.45 - Great  toe pressure: 53  Arterial duplex on 11/19/2022: Right: No significant change compared to previous study. Non hemodynamically significant heterogeneous plaque seen in the popliteal artery. No significant stenosis seen.   Previous ABI's/TBI's on 07/25/2021: Right:  1.25/0.85 - Great toe pressure: 115 Left:  1.23/0.76 - Great toe pressure:  103  Previous arterial duplex on 07/25/2021: Right: No significant change compared to previous study. Non hemodynamically significant heterogeneous plaque seen in the popliteal artery. No significant stenosis seen.   Previous carotid duplex 07/25/2021: Bilateral 1-39% ICA stenosis   ASSESSMENT/PLAN:: 76 y.o. male here for follow up for PAD with hx of right popliteal endarterectomy 11/11/2017 by Dr. Oneida Alar.      -pt doing well without claudication, rest pain or non healing wounds.  He is compliant with his asa and statin, which he will continue.   -pt will f/u in one year with ABI and RLE arterial duplex.  He knows to call sooner if he has any issues before then.    Leontine Locket, [facility] [phone]  Clinic MD:   Donzetta Matters

## 2022-11-19 ENCOUNTER — Ambulatory Visit (INDEPENDENT_AMBULATORY_CARE_PROVIDER_SITE_OTHER)
Admission: RE | Admit: 2022-11-19 | Discharge: 2022-11-19 | Disposition: A | Discharge status: Home / Self Care | Payer: Medicare Other | Source: Ambulatory Visit | Attending: Vascular Surgery | Admitting: Vascular Surgery

## 2022-11-19 ENCOUNTER — Ambulatory Visit (INDEPENDENT_AMBULATORY_CARE_PROVIDER_SITE_OTHER): Payer: Medicare Other | Admitting: Physician Assistant

## 2022-11-19 ENCOUNTER — Ambulatory Visit (HOSPITAL_COMMUNITY)
Admission: RE | Admit: 2022-11-19 | Discharge: 2022-11-19 | Disposition: A | Discharge status: Home / Self Care | Payer: Medicare Other | Source: Ambulatory Visit | Attending: Vascular Surgery | Admitting: Vascular Surgery

## 2022-11-19 VITALS — BP 134/76 | HR 65 | Temp 98.2°F | Resp 20 | Ht 69.0 in | Wt 205.2 lb

## 2022-11-19 DIAGNOSIS — I2510 Atherosclerotic heart disease of native coronary artery without angina pectoris: Secondary | ICD-10-CM

## 2022-11-19 DIAGNOSIS — I739 Peripheral vascular disease, unspecified: Secondary | ICD-10-CM | POA: Diagnosis present

## 2022-11-28 ENCOUNTER — Ambulatory Visit: Payer: Medicare Other | Attending: Cardiology

## 2022-11-28 DIAGNOSIS — Z79899 Other long term (current) drug therapy: Secondary | ICD-10-CM

## 2022-11-28 DIAGNOSIS — I10 Essential (primary) hypertension: Secondary | ICD-10-CM

## 2022-11-28 LAB — BASIC METABOLIC PANEL
BUN/Creatinine Ratio: 16 (ref 10–24)
BUN: 20 mg/dL (ref 8–27)
CO2: 24 mmol/L (ref 20–29)
Calcium: 9.3 mg/dL (ref 8.6–10.2)
Chloride: 98 mmol/L (ref 96–106)
Creatinine, Ser: 1.24 mg/dL (ref 0.76–1.27)
Glucose: 92 mg/dL (ref 70–99)
Potassium: 4.7 mmol/L (ref 3.5–5.2)
Sodium: 135 mmol/L (ref 134–144)
eGFR: 61 mL/min/{1.73_m2} (ref >59)

## 2022-12-02 ENCOUNTER — Other Ambulatory Visit: Payer: Self-pay | Admitting: Cardiology

## 2023-01-03 ENCOUNTER — Other Ambulatory Visit: Payer: Self-pay | Admitting: Cardiology

## 2023-07-16 ENCOUNTER — Telehealth: Payer: Self-pay

## 2023-07-16 DIAGNOSIS — I739 Peripheral vascular disease, unspecified: Secondary | ICD-10-CM

## 2023-07-16 NOTE — Telephone Encounter (Signed)
Caller: Patient  Concern: hip claudication, pt used to be able to walk 1 mile before symptoms, but it has decreased to approx 100 ft before he has to stop and rest for a few min  Pt denies swelling, rest pain, wounds, or discoloration  Location: left leg  Description:  progressively worsening over the past 2 mos  Aggravating Factors: walking  Quality: cramping and tight (pulling)  Treatments:  stop to rest  Resolution: Appointment scheduled using recall for LE Arterial, ABI, & PA. Appts split to get pt seen sooner  Next Appt: Appointment scheduled for [place] on 8/22 @ 1300, 9/12 @ 0830 for PA (appt placed on wait list)

## 2023-07-21 ENCOUNTER — Encounter (HOSPITAL_BASED_OUTPATIENT_CLINIC_OR_DEPARTMENT_OTHER): Payer: Self-pay | Admitting: Family

## 2023-07-21 ENCOUNTER — Ambulatory Visit (INDEPENDENT_AMBULATORY_CARE_PROVIDER_SITE_OTHER): Payer: Medicare Other | Admitting: Family

## 2023-07-21 VITALS — BP 144/74 | HR 60 | Ht 69.0 in | Wt 205.0 lb

## 2023-07-21 DIAGNOSIS — I25118 Atherosclerotic heart disease of native coronary artery with other forms of angina pectoris: Secondary | ICD-10-CM

## 2023-07-21 DIAGNOSIS — I739 Peripheral vascular disease, unspecified: Secondary | ICD-10-CM

## 2023-07-21 DIAGNOSIS — E785 Hyperlipidemia, unspecified: Secondary | ICD-10-CM | POA: Diagnosis not present

## 2023-07-21 DIAGNOSIS — I10 Essential (primary) hypertension: Secondary | ICD-10-CM | POA: Diagnosis not present

## 2023-07-21 MED ORDER — EZETIMIBE 10 MG PO TABS: 10.0000 mg | ORAL_TABLET | Freq: Every day | ORAL | 2 refills | Status: DC

## 2023-07-21 MED ORDER — AMLODIPINE-OLMESARTAN 5-20 MG PO TABS: 1.0000 | ORAL_TABLET | Freq: Every day | ORAL | 3 refills | Status: DC

## 2023-07-21 MED ORDER — ROSUVASTATIN CALCIUM 5 MG PO TABS: 5.0000 mg | ORAL_TABLET | Freq: Every day | ORAL | 3 refills | Status: DC

## 2023-07-21 NOTE — Progress Notes (Signed)
Cardiology Office Note:  .   Date:  07/24/2023  ID:  Willie Burton, DOB 03/07/1946, MRN [id number] PCP: Melida Quitter, MD  [facility] Providers Cardiologist:  Donato Schultz, MD    History of Present Illness: .   Willie Burton is a 77 y.o. male with history of coronary artery disease s/p 11/2015 CABG x3 (LIMA-LAD, SVG-OM1, SVG-PDA), hyperlipidemia, hypertension, PVC.  Seen by Dr. Elita Boone 06/11/2022 doing well with only occasional PVC tolerating metoprolol  Presents today for follow up. Recently got back from a trip to [place]. Couple months left hip pain similar to prior claudication symptoms with upcoming visit with [facility]. BP at home most often 130-140s.Did recently take a month of voltaren PO. Walking - 3x per week for exercise.   ROS: Please see the history of present illness.    All other systems reviewed and are negative.   Studies Reviewed: Marland Kitchen   EKG Interpretation Date/Time:  Tuesday July 21 2023 09:08:29 EDT Ventricular Rate:  60 PR Interval:  202 QRS Duration:  96 QT Interval:  422 QTC Calculation: 422 R Axis:   27  Text Interpretation: Normal sinus rhythm Incomplete right bundle branch block Confirmed by Willie Burton ([id number]) on 07/21/2023 9:37:46 AM    Cardiac Studies & Procedures   CARDIAC CATHETERIZATION  CARDIAC CATHETERIZATION 11/13/2015  Narrative  Prox RCA lesion, 70% stenosed.  Mid RCA to Dist RCA lesion, 99% stenosed.  Ost LM to LM lesion, 20% stenosed.  Ost LAD lesion, 80% stenosed.  The left ventricular systolic function is normal.  2 vessel heavily calcified disease with preserved lv function. Will need consideration for cabg.  Findings Coronary Findings Diagnostic  Dominance: Right  Left Main  Left Anterior Descending  Right Coronary Artery  Right Posterior Descending Artery Collaterals RPDA filled by collaterals from Dist LAD.  Intervention  No interventions have been documented.                Risk  Assessment/Calculations:          Physical Exam:   VS:  BP (!) 144/74   Pulse 60   Ht 5\' 9"  (1.753 m)   Wt 205 lb (93 kg)   BMI 30.27 kg/m    Wt Readings from Last 3 Encounters:  07/21/23 205 lb (93 kg)  11/19/22 205 lb 3.2 oz (93.1 kg)  06/11/22 195 lb 12.8 oz (88.8 kg)    GEN: Well nourished, well developed in no acute distress NECK: No JVD; No carotid bruits CARDIAC: RRR, no murmurs, rubs, gallops RESPIRATORY:  Clear to auscultation without rales, wheezing or rhonchi  ABDOMEN: Soft, non-tender, non-distended EXTREMITIES:  No edema; No deformity   ASSESSMENT AND PLAN: .    CAD s/p CABG - Stable with no anginal symptoms. No indication for ischemic evaluation.  GDMT aspirin, rosuvastatin, zetia. No BB due to baseline bradycardia. Recommend aiming for 150 minutes of moderate intensity activity per week and following a heart healthy diet.    HTN - BP not at goal <130/80 at home or in clinic. Marland Kitchen Hesitant to increase Olmesartan from 20 to 40mg  and prefers not to split tablets. Will stop Olmesartan and start Amlodipine-Olmesartan 5-20mg  daily. He will consider referral to PREP program. Check in via MyChart in a few weeks to ensure BP at goal <130/80.   HLD, LDL goal <70 - Continue Zetia 10mg  daily, Rosuvastatin 5mg  daily. Upcoming PCP visit with lipid check.   PAD - Claudication symptoms in L hip. Upcoming eval with [facility].  Dispo: follow up in 1 year  Signed, Willie Sorrow, NP

## 2023-07-21 NOTE — Patient Instructions (Signed)
Medication Instructions:  Your physician has recommended you make the following change in your medication:   STOP Olmesartan  START Amlodipine-Olmesartan 5-20mg  daily   *If you need a refill on your cardiac medications before your next appointment, please call your pharmacy*  Follow-Up: At [facility], you and your health needs are our priority.  As part of our continuing mission to provide you with exceptional heart care, we have created designated Provider Care Teams.  These Care Teams include your primary Cardiologist (physician) and Advanced Practice Providers (APPs -  Physician Assistants and Nurse Practitioners) who all work together to provide you with the care you need, when you need it.  We recommend signing up for the patient portal called "MyChart".  Sign up information is provided on this After Visit Summary.  MyChart is used to connect with patients for Virtual Visits (Telemedicine).  Patients are able to view lab/test results, encounter notes, upcoming appointments, etc.  Non-urgent messages can be sent to your provider as well.   To learn more about what you can do with MyChart, go to [email or web address].    Your next appointment:   1 year(s)  Provider:   Donato Schultz, MD or Gillian Shields, NP    Other Instructions

## 2023-07-23 ENCOUNTER — Ambulatory Visit (HOSPITAL_COMMUNITY)
Admission: RE | Admit: 2023-07-23 | Discharge: 2023-07-23 | Disposition: A | Discharge status: Home / Self Care | Payer: Medicare Other | Source: Ambulatory Visit | Attending: Vascular Surgery | Admitting: Vascular Surgery

## 2023-07-23 ENCOUNTER — Encounter (HOSPITAL_COMMUNITY): Payer: Self-pay

## 2023-07-23 DIAGNOSIS — I739 Peripheral vascular disease, unspecified: Secondary | ICD-10-CM | POA: Diagnosis not present

## 2023-07-23 LAB — VAS US ABI WITH/WO TBI
Left ABI: 1.19
Right ABI: 1.2

## 2023-08-02 IMAGING — CT CT ABD-PELV W/ CM
2 of 5 series · 16 of 46 positions shown, 18 images · IV contrast (iopamidol)
Comparison: None.

CLINICAL DATA: Generalized abdominal tenderness without rebound
tenderness.

EXAM:
CT ABDOMEN AND PELVIS WITH CONTRAST
TECHNIQUE: Multidetector CT imaging of the abdomen and pelvis was performed
using the standard protocol following bolus administration of
intravenous contrast.
CONTRAST:  75mL INZ61O-TGH IOPAMIDOL (INZ61O-TGH) INJECTION 76%

[Series 2: abd pelvis 5.00 br40 s3 axial · axial · 0.85mm/px · z∈[+1077,+1507]mm · 13 of 96 slices shown, 15 images]
[im 5/96  soft-tissue]
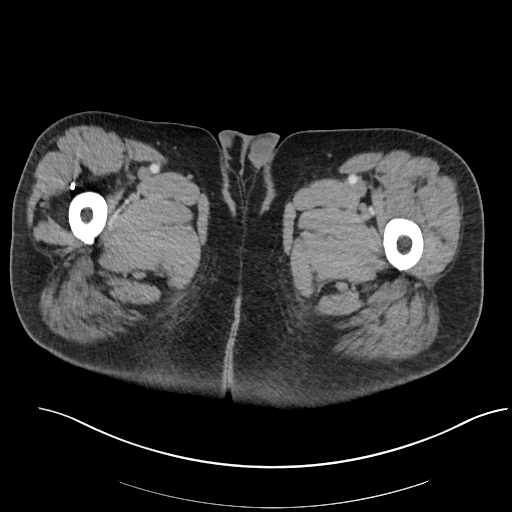
[im 5/96  bone]
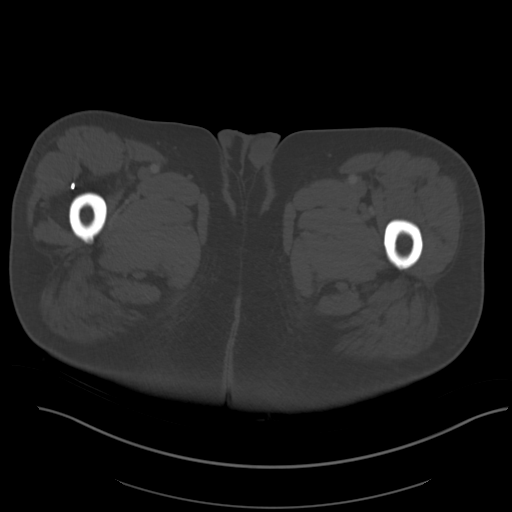
[im 15/96  soft-tissue]
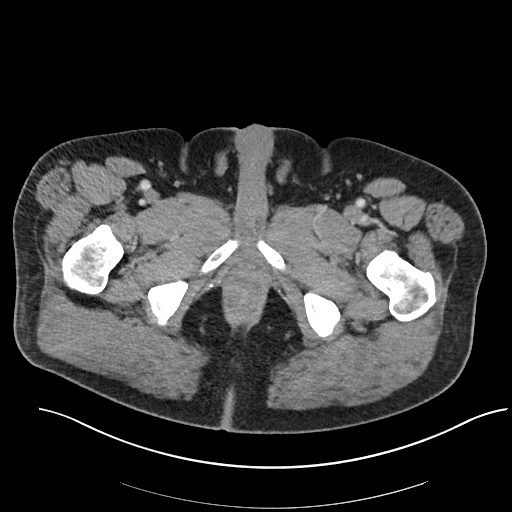
[im 20/96  soft-tissue]
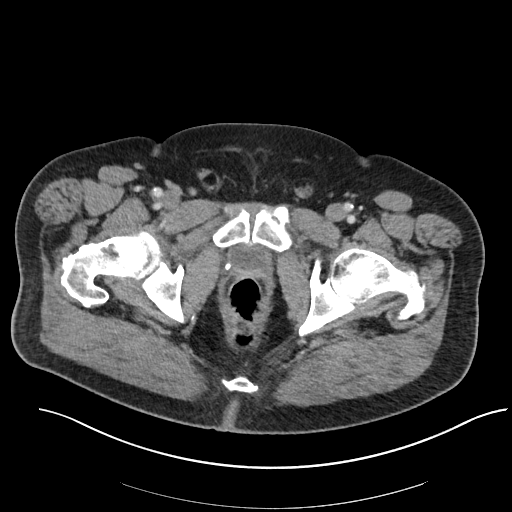
[im 29/96  soft-tissue]
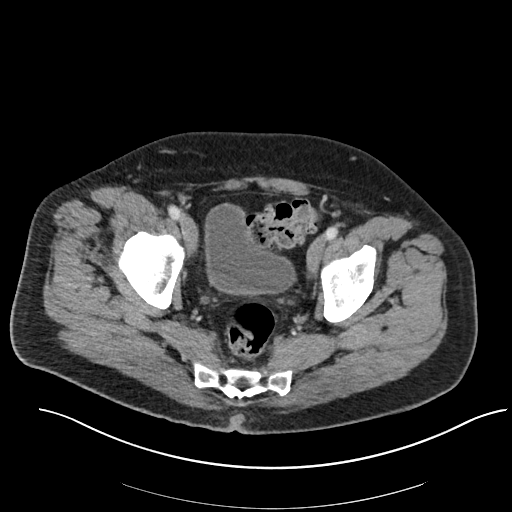
[im 34/96  soft-tissue]
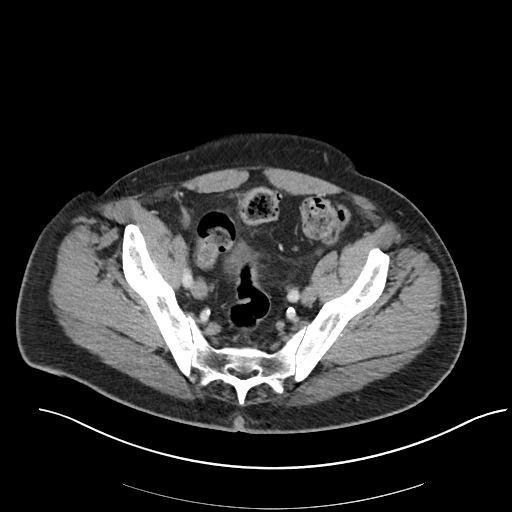
[im 43/96  soft-tissue]
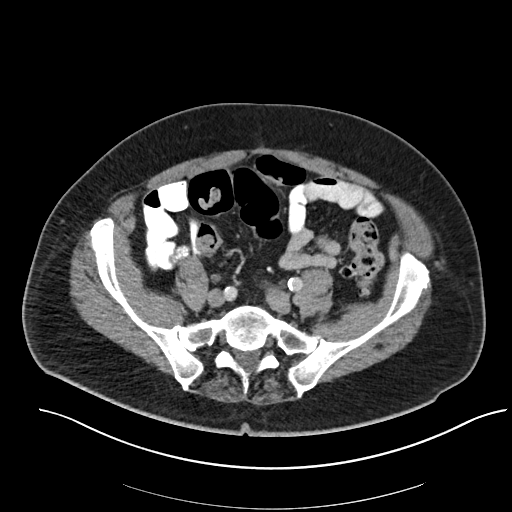
[im 48/96  soft-tissue]
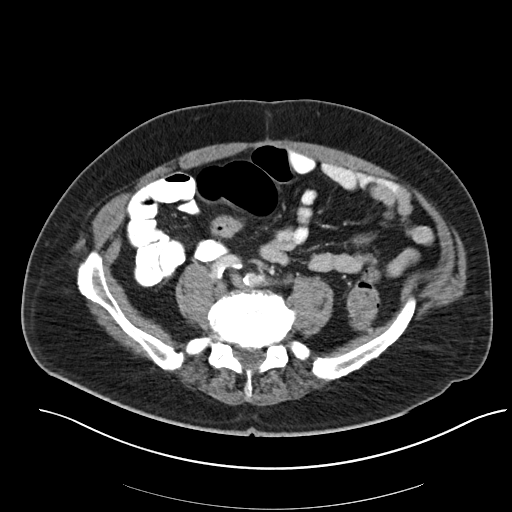
[im 53/96  soft-tissue]
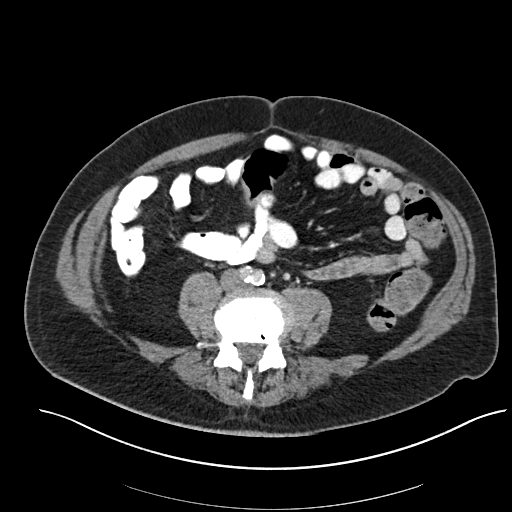
[im 62/96  soft-tissue]
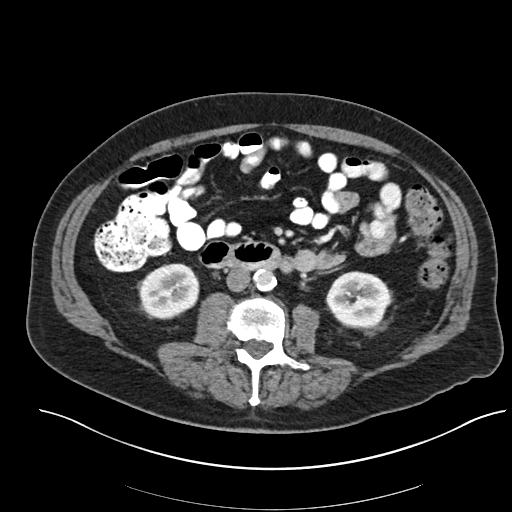
[im 62/96  bone]
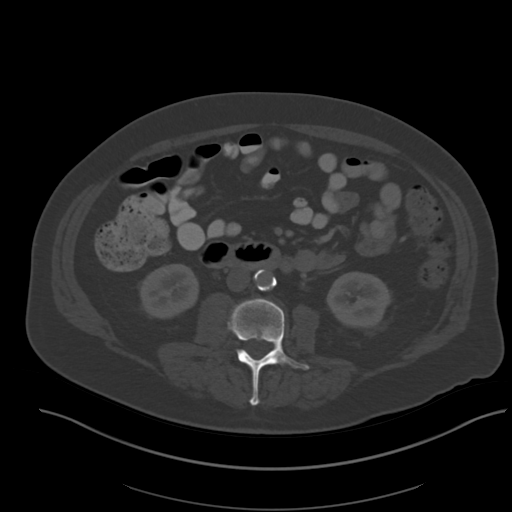
[im 67/96  soft-tissue]
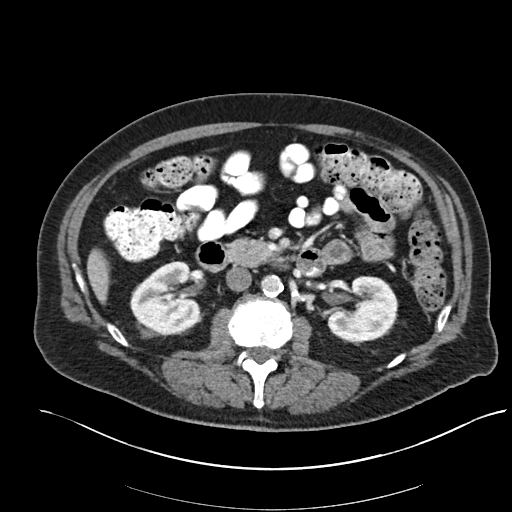
[im 77/96  soft-tissue]
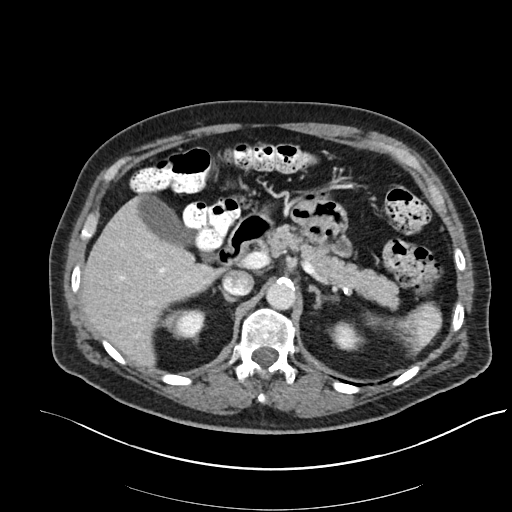
[im 81/96  soft-tissue]
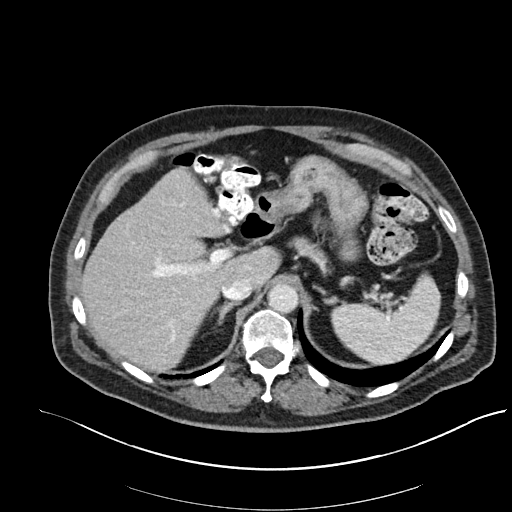
[im 91/96  soft-tissue]
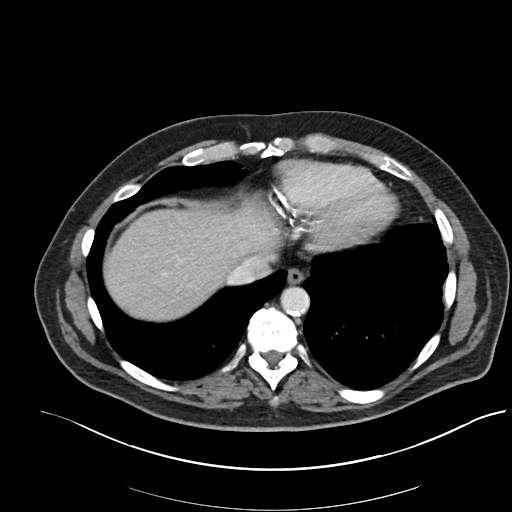

[Series 6: abd pelvis 2.00 br40 s3 cor · coronal · 0.85mm/px · 3 of 217 slices shown]
[im 73/217  soft-tissue]
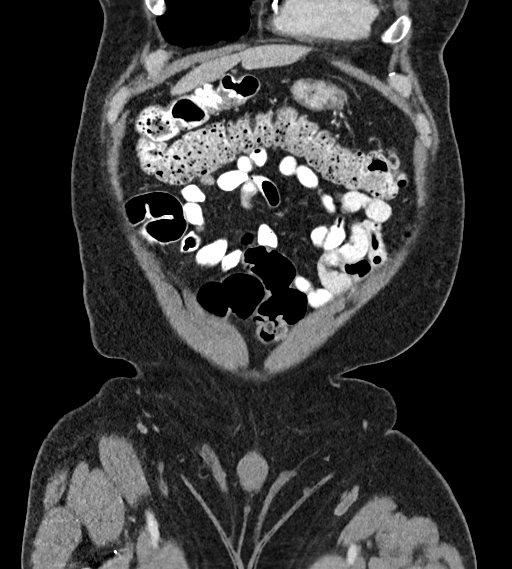
[im 97/217  soft-tissue]
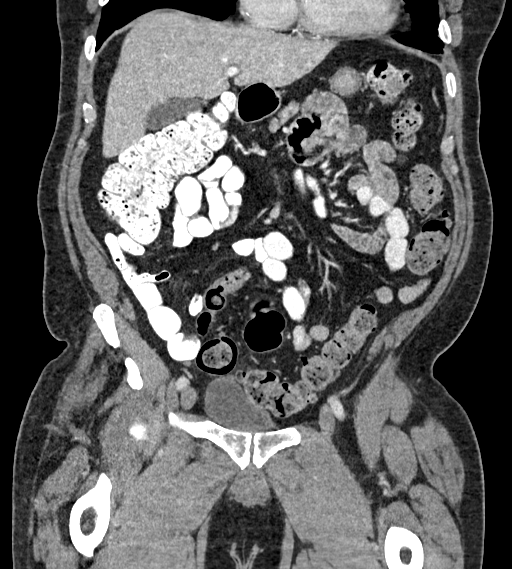
[im 121/217  soft-tissue]
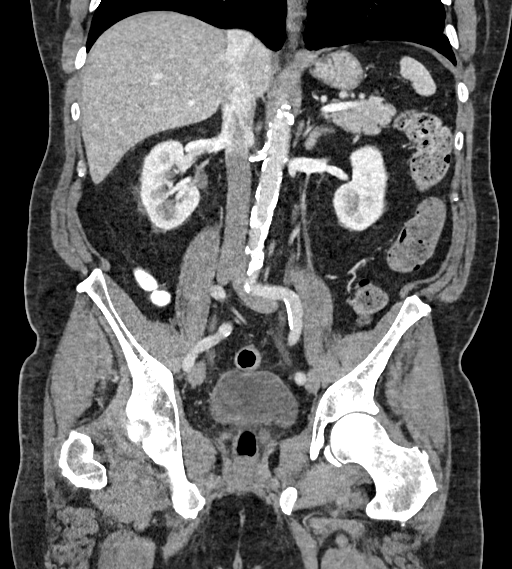

[16 of 46 positions shown; findings below may reference images not displayed]

FINDINGS: Lower chest: Pleural-based densities in the left lower lobe are
suggestive for scarring or atelectasis. No pleural effusions.
Coronary artery calcifications.

Hepatobiliary: Normal appearance of the liver, gallbladder and
portal venous system.

Pancreas: Unremarkable. No pancreatic ductal dilatation or
surrounding inflammatory changes.

Spleen: Normal in size without focal abnormality.

Adrenals/Urinary Tract: Normal adrenal glands. Low-density
structures in left kidney are suggestive for renal cysts. No
hydronephrosis. No suspicious renal lesions. Normal appearance of
the urinary bladder.

Stomach/Bowel: Multiple colonic diverticula without acute
inflammatory changes. Normal appearance of the stomach. No bowel
distention.

Vascular/Lymphatic: Atherosclerotic calcifications in the abdominal
aorta without aneurysm. No significant lymph node enlargement in the
abdomen or pelvis.

Reproductive: Prostatectomy.

Other: Negative for ascites.  Negative for free air.

Musculoskeletal: Surgical clips in the deep soft tissues of the
proximal right thigh. Multilevel disc space narrowing in the lumbar
spine. Median sternotomy wires.
IMPRESSION: 1. No acute abnormality in the abdomen or pelvis.
2. Colonic diverticulosis.  No acute bowel inflammation.
3.  Aortic Atherosclerosis ([id number].T).
4. Prostatectomy.

## 2023-08-06 ENCOUNTER — Ambulatory Visit: Payer: Medicare Other

## 2023-08-13 ENCOUNTER — Ambulatory Visit (HOSPITAL_COMMUNITY)
Admission: RE | Admit: 2023-08-13 | Discharge: 2023-08-13 | Disposition: A | Discharge status: Home / Self Care | Payer: Medicare Other | Source: Ambulatory Visit | Attending: Vascular Surgery | Admitting: Vascular Surgery

## 2023-08-13 ENCOUNTER — Ambulatory Visit (INDEPENDENT_AMBULATORY_CARE_PROVIDER_SITE_OTHER): Payer: Medicare Other | Admitting: Physician Assistant

## 2023-08-13 ENCOUNTER — Other Ambulatory Visit (HOSPITAL_COMMUNITY): Payer: Self-pay | Admitting: Physician Assistant

## 2023-08-13 VITALS — BP 119/72 | HR 63 | Temp 97.6°F | Resp 18 | Ht 69.0 in | Wt 202.0 lb

## 2023-08-13 DIAGNOSIS — I739 Peripheral vascular disease, unspecified: Secondary | ICD-10-CM | POA: Insufficient documentation

## 2023-08-13 NOTE — Progress Notes (Signed)
[facility] [place] HISTORY AND PHYSICAL   History of Present Illness:  Patient is a 77 y.o. year old male who presents for evaluation of claudication.  The patient currently describes a cramping sensation in the left gluteal/posterior hip after walking 100 feet.  He denies rest pain, non healing wounds.  He states that this has been progressive over the past 2 months.     He has a history of right popliteal stenosis requiring bypass by DR. Fields 11/11/2017.  He has had normal TBI with elevated index and calcified vessels.   ABI's multiphasic.  He was able to walk 1-2 miles prior to this.    He has stopped exercising at all because of the debilitating pain.  Of note he does have a gluteal tear on the left and history of lumbar spinal stenosis and disc protrusions.    He is medically managed with Statin and ASA daily.  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   Arthritis    Coronary artery disease    Diverticula of colon    Dysrhythmia    PACs, PVCs   Heart disease    High blood pressure    High cholesterol    Hyperlipemia    Peripheral vascular disease (HCC)    Prostate cancer [facility])     Past Surgical History:  Procedure Laterality Date   ABDOMINAL AORTOGRAM W/LOWER EXTREMITY N/A 10/16/2017   Procedure: ABDOMINAL AORTOGRAM W/LOWER EXTREMITY;  Surgeon: Sherren Kerns, MD;  Location: [facility];  Service: Cardiovascular;  Laterality: N/A;   APPENDECTOMY     CARDIAC CATHETERIZATION N/A 11/13/2015   Procedure: Left Heart Cath and Coronary Angiography;  Surgeon: Dalia Heading, MD;  Location: [facility] INVASIVE CV LAB;  Service: Cardiovascular;  Laterality: N/A;   CATARACT EXTRACTION     COLON SURGERY  2008   right colon resection   COLONOSCOPY WITH PROPOFOL N/A 01/06/2018   Procedure: COLONOSCOPY WITH PROPOFOL;  Surgeon: Toledo, Boykin Nearing, MD;  Location: [facility] ENDOSCOPY;  Service: Gastroenterology;  Laterality: N/A;    ENDARTERECTOMY POPLITEAL Right 11/11/2017   FEMORAL-POPLITEAL BYPASS GRAFT Right 11/11/2017   Procedure: RIGHT POPLITEAL ENDARTERECTOMY;  Surgeon: Sherren Kerns, MD;  Location: [facility] OR;  Service: Vascular;  Laterality: Right;   LIPOMA EXCISION Right    thigh   METACARPOPHALANGEAL JOINT ARTHROPLASTY     PATCH ANGIOPLASTY Right 11/11/2017   Procedure: PATCH ANGIOPLASTY of Superficial Fenoral Artery;  Surgeon: Sherren Kerns, MD;  Location: [facility] OR;  Service: Vascular;  Laterality: Right;   PROSTATECTOMY  2003   ULNAR NERVE TRANSPOSITION Left January 2015    ROS:   General:  No weight loss, Fever, chills  HEENT: No recent headaches, no nasal bleeding, no visual changes, no sore throat  Neurologic: No dizziness, blackouts, seizures. No recent symptoms of stroke or mini- stroke. No recent episodes of slurred speech, or temporary blindness.  Cardiac: No recent episodes of chest pain/pressure, no shortness of breath at rest.  No shortness of breath with exertion.  Denies history of atrial fibrillation or irregular heartbeat  Vascular: No history of rest pain in feet.  No history of claudication.  No history of non-healing ulcer, No history of DVT   Pulmonary: No home oxygen, no productive cough, no hemoptysis,  No asthma or wheezing  Musculoskeletal:  [ ]  Arthritis, [ ]  Low back pain,  [ ]  Joint pain  Hematologic:No history of hypercoagulable state.  No history of easy bleeding.  No history of anemia  Gastrointestinal: No hematochezia or melena,  No gastroesophageal reflux, no trouble swallowing  Urinary: [ ]  chronic Kidney disease, [ ]  on HD - [ ]  MWF or [ ]  TTHS, [ ]  Burning with urination, [ ]  Frequent urination, [ ]  Difficulty urinating;   Skin: No rashes  Psychological: No history of anxiety,  No history of depression  Social History Social History   Tobacco Use   Smoking status: Former    Current packs/day: 0.00    Average packs/day: 1.5 packs/day for 5.0 years (7.5  ttl pk-yrs)    Types: Cigarettes    Start date: 56    Quit date: 8    Years since quitting: 39.7    Passive exposure: Never   Smokeless tobacco: Never   Tobacco comments:    Quit 40+ years ago  Vaping Use   Vaping status: Never Used  Substance Use Topics   Alcohol use: No    Alcohol/week: 0.0 standard drinks of alcohol   Drug use: No    Family History Family History  Problem Relation Age of Onset   COPD Mother        Deceased, 73   Throat cancer Father        Deceased, 33s   Healthy Brother    Healthy Daughter    Healthy Son     Allergies  Allergies  Allergen Reactions   Pravastatin Sodium Other (See Comments)    Muscle aches    Amoxicillin Hives and Rash    Has patient had a PCN reaction causing immediate rash, facial/tongue/throat swelling, SOB or lightheadedness with hypotension: Yes Has patient had a PCN reaction causing severe rash involving mucus membranes or skin necrosis: No Has patient had a PCN reaction that required hospitalization: No Has patient had a PCN reaction occurring within the last 10 years: No If all of the above answers are "NO", then may proceed with Cephalosporin use.    Hepatitis A Virus Vaccine Inactivated Rash   Typhoid Vaccines Rash     Current Outpatient Medications  Medication Sig Dispense Refill   amLODipine-olmesartan (AZOR) 5-20 MG tablet Take 1 tablet by mouth daily. 90 tablet 3   aspirin EC 81 MG tablet Take 81 mg daily by mouth.      cholecalciferol (VITAMIN D) 1000 units tablet Take 1,000 Units daily by mouth.     ezetimibe (ZETIA) 10 MG tablet Take 1 tablet (10 mg total) by mouth daily. 90 tablet 2   MAGNESIUM-OXIDE 400 (241.3 Mg) MG tablet Take 400 mg by mouth daily.  11   Multiple Vitamins-Minerals (ICAPS AREDS 2 PO) Take 2 capsules by mouth daily.     olmesartan (BENICAR) 20 MG tablet Take 1 tablet (20 mg total) by mouth daily. 90 tablet 3   rosuvastatin (CRESTOR) 5 MG tablet Take 1 tablet (5 mg total) by mouth  daily. 90 tablet 3   vitamin B-12 (CYANOCOBALAMIN) 1000 MCG tablet Take 1,000 mcg by mouth daily. Reported on 12/31/2015     No current facility-administered medications for this visit.    Physical Examination  Vitals:   08/13/23 0825  BP: 119/72  Pulse: 63  Resp: 18  Temp: 97.6 F (36.4 C)  TempSrc: Temporal  SpO2: 99%  Weight: 202 lb (91.6 kg)  Height: 5\' 9"  (1.753 m)    Body mass index is 29.83 kg/m.  General:  Alert and oriented, no acute distress HEENT: Normal Neck: No bruit or JVD  Pulmonary: Clear to auscultation bilaterally Cardiac: Regular Rate and Rhythm without murmur Abdomen: Soft, non-tender, non-distended, no mass, no scars Skin: No rash Extremity Pulses:   radial,  femoral  pulses bilaterally Musculoskeletal: No deformity or edema  Neurologic: Upper and lower extremity motor 5/5 and symmetric  DATA:  ABI Findings:  +---------+------------------+-----+-----------+--------+  Right   Rt Pressure (mmHg)IndexWaveform   Comment   +---------+------------------+-----+-----------+--------+  Brachial 133                                         +---------+------------------+-----+-----------+--------+  PTA     160               1.20 multiphasic          +---------+------------------+-----+-----------+--------+  DP      156               1.17 multiphasic          +---------+------------------+-----+-----------+--------+  Great Toe129               0.97 Normal               +---------+------------------+-----+-----------+--------+   +---------+------------------+-----+-----------+-------+  Left    Lt Pressure (mmHg)IndexWaveform   Comment  +---------+------------------+-----+-----------+-------+  Brachial 129                                        +---------+------------------+-----+-----------+-------+  PTA     158               1.19 multiphasic         +---------+------------------+-----+-----------+-------+   DP      151               1.14 multiphasic         +---------+------------------+-----+-----------+-------+  Great Toe105               0.79 Normal              +---------+------------------+-----+-----------+-------+   +-------+-----------+-----------+------------+------------+  ABI/TBIToday's ABIToday's TBIPrevious ABIPrevious TBI  +-------+-----------+-----------+------------+------------+  Right 1.20       0.97       1.09        0.61          +-------+-----------+-----------+------------+------------+  Left  1.19       0.79       1.34        0.79          +-------+-----------+-----------+------------+------------+      PPG tracings display appropriate pulsatility.  Right ABIs and TBIs appear increased. Left ABI is decreased but still  within normal range (1.29-0.95). Left TBI is increased.    Summary:  Right: Resting right ankle-brachial index is within normal range. The  right toe-brachial index is normal.  Abdominal Aorta Findings:  +-------------+-------+----------+----------+---------+--------+--------+  Location    AP (cm)Trans (cm)PSV (cm/s)Waveform ThrombusComments  +-------------+-------+----------+----------+---------+--------+--------+  Proximal                     72                                   +-------------+-------+----------+----------+---------+--------+--------+  Mid  94                                   +-------------+-------+----------+----------+---------+--------+--------+  Distal                       79                                   +-------------+-------+----------+----------+---------+--------+--------+  RT CIA Prox                   86        biphasic                   +-------------+-------+----------+----------+---------+--------+--------+  RT CIA Mid                    124       triphasic                   +-------------+-------+----------+----------+---------+--------+--------+  RT CIA Distal                 124       biphasic                   +-------------+-------+----------+----------+---------+--------+--------+  RT EIA Prox                   96        biphasic                   +-------------+-------+----------+----------+---------+--------+--------+  RT EIA Mid                    99        biphasic                   +-------------+-------+----------+----------+---------+--------+--------+  RT EIA Distal                 108       biphasic                   +-------------+-------+----------+----------+---------+--------+--------+  LT CIA Prox                   132       triphasic                  +-------------+-------+----------+----------+---------+--------+--------+  LT CIA Mid                    137       biphasic                   +-------------+-------+----------+----------+---------+--------+--------+  LT CIA Distal                 110       biphasic                   +-------------+-------+----------+----------+---------+--------+--------+  LT EIA Prox                   77        biphasic                   +-------------+-------+----------+----------+---------+--------+--------+  LT EIA Mid  95        triphasic                  +-------------+-------+----------+----------+---------+--------+--------+  LT EIA Distal                 65        biphasic                   +-------------+-------+----------+----------+---------+--------+--------+       Summary:  Stenosis:  No obvious evidence of hemodynamically significant stenosis involving the  aorta, bilateral common iliac arteries, or bilateral external iliac  arteries.    ASSESSMENT/PLAN:  Claudication verses new gluteal tear verse spinal stenosis left LE left gluteal/lumbar pain with ambulation at 100 feet.  He has a history  of right LE claudication requiring.  He was concerned because he had similar claudication symptoms prior to his bypass on the right LE that showed normal ABI's.     His ABI's are WNL and demonstrated calcified vessels with normal TBI's.  The aortoiliac duplex shows biphasic flow with No obvious evidence of hemodynamically significant stenosis involving the aorta, bilateral common iliac arteries, or bilateral external iliac arteries.  At this point he has good arterial inflow.  He will f/u with an orthopedic to discuss left gluteal "tear" and/or lumbar.  I will schedule f/u 1 year for surveillance.           Mosetta Pigeon PA-C [facility] [place] Office: [phone]  MD in clinic [place]

## 2023-08-18 ENCOUNTER — Ambulatory Visit (INDEPENDENT_AMBULATORY_CARE_PROVIDER_SITE_OTHER): Payer: Medicare Other | Admitting: Orthopaedic Surgery

## 2023-08-18 DIAGNOSIS — G8929 Other chronic pain: Secondary | ICD-10-CM | POA: Diagnosis not present

## 2023-08-18 DIAGNOSIS — M545 Low back pain, unspecified: Secondary | ICD-10-CM | POA: Diagnosis not present

## 2023-08-18 NOTE — Progress Notes (Addendum)
Office Visit Note   Patient: Willie Burton           Date of Birth: 1945/12/04           MRN: [id number] Visit Date: 08/18/2023              Requested by: Melida Quitter, MD [address] [place],  [place] [address] PCP: Melida Quitter, MD   Assessment & Plan: Visit Diagnoses:  1. Chronic left-sided low back pain without sciatica     Plan: [name] is a very pleasant 77 year old gentleman with mainly left-sided low back pain without radiculopathy.  This certainly could be exacerbated by the gluteus minimus tear.  He is not interested in surgical intervention which I agree with.  I would like to send him to physical therapy to work on the back pain which is likely from facet disease but could also be from the SI joint.  From my standpoint we can see him back as needed.  Follow-Up Instructions: No follow-ups on file.   Orders:  No orders of the defined types were placed in this encounter.  No orders of the defined types were placed in this encounter.     Procedures: No procedures performed   Clinical Data: No additional findings.   Subjective: Chief Complaint  Patient presents with   Left Hip - Pain    HPI [name] is a very pleasant 77 year old gentleman here for evaluation of left hip pain.  Recently had an MRI at [facility] that showed 90% left gluteus minimus tear.  He states that the pain is 3 out of 10 at most.  This causes him to walk with a Trendelenburg gait but he is also not interested in surgery.  His main complaint is left-sided low back pain. Review of Systems  Constitutional: Negative.   HENT: Negative.    Eyes: Negative.   Respiratory: Negative.    Cardiovascular: Negative.   Gastrointestinal: Negative.   Endocrine: Negative.   Genitourinary: Negative.   Skin: Negative.   Allergic/Immunologic: Negative.   Neurological: Negative.   Hematological: Negative.   Psychiatric/Behavioral: Negative.    All other systems reviewed and are  negative.    Objective: Vital Signs: There were no vitals taken for this visit.  Physical Exam Vitals and nursing note reviewed.  Constitutional:      Appearance: He is well-developed.  HENT:     Head: Normocephalic and atraumatic.  Eyes:     Pupils: Pupils are equal, round, and reactive to light.  Pulmonary:     Effort: Pulmonary effort is normal.  Abdominal:     Palpations: Abdomen is soft.  Musculoskeletal:        General: Normal range of motion.     Cervical back: Neck supple.  Skin:    General: Skin is warm.  Neurological:     Mental Status: He is alert and oriented to person, place, and time.  Psychiatric:        Behavior: Behavior normal.        Thought Content: Thought content normal.        Judgment: Judgment normal.     Ortho Exam Examination of the left hip joint is normal.  There is no trochanteric tenderness.  No sciatic tension signs.  Negative Faber test.  Negative axial compression. Specialty Comments:  No specialty comments available.  Imaging: No results found.   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   H/O coronary artery bypass surgery 11/20/2015   Atherosclerotic heart disease of native coronary artery without angina pectoris 11/15/2015   HNP (herniated nucleus pulposus), lumbar 11/15/2014   Lumbar radiculitis 11/15/2014   Ulnar neuropathy of left upper extremity  10/13/2014   AC (acromioclavicular) arthritis 04/04/2013   [facility] arthritis, thumb, degenerative 06/14/2012   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   Arthritis    Coronary artery disease    Diverticula of colon    Dysrhythmia    PACs, PVCs   Heart disease    High blood pressure    High cholesterol    Hyperlipemia    Peripheral vascular disease (HCC)    Prostate cancer (HCC)     Family History  Problem Relation Age of Onset   COPD Mother        Deceased, 23   Throat cancer Father        Deceased, 48s   Healthy Brother    Healthy Daughter    Healthy Son     Past Surgical History:  Procedure Laterality Date   ABDOMINAL AORTOGRAM W/LOWER EXTREMITY N/A 10/16/2017   Procedure: ABDOMINAL AORTOGRAM W/LOWER EXTREMITY;  Surgeon: Sherren Kerns, MD;  Location: [facility];  Service: Cardiovascular;  Laterality: N/A;   APPENDECTOMY     CARDIAC CATHETERIZATION N/A 11/13/2015   Procedure: Left Heart Cath and Coronary Angiography;  Surgeon: Dalia Heading, MD;  Location: [facility] INVASIVE CV LAB;  Service: Cardiovascular;  Laterality: N/A;   CATARACT EXTRACTION     COLON SURGERY  2008   right colon resection   COLONOSCOPY WITH PROPOFOL N/A 01/06/2018   Procedure: COLONOSCOPY WITH PROPOFOL;  Surgeon: Toledo, Boykin Nearing, MD;  Location: [facility] ENDOSCOPY;  Service: Gastroenterology;  Laterality: N/A;   ENDARTERECTOMY POPLITEAL Right 11/11/2017   FEMORAL-POPLITEAL BYPASS GRAFT Right 11/11/2017   Procedure: RIGHT POPLITEAL ENDARTERECTOMY;  Surgeon: Sherren Kerns, MD;  Location[facility] OR;  Service: Vascular;  Laterality: Right;   LIPOMA EXCISION Right    thigh   METACARPOPHALANGEAL JOINT ARTHROPLASTY     PATCH ANGIOPLASTY Right 11/11/2017   Procedure: PATCH ANGIOPLASTY of Superficial Fenoral Artery;  Surgeon: Sherren Kerns, MD;  Location: [facility] OR;  Service: Vascular;  Laterality: Right;   PROSTATECTOMY  2003   ULNAR NERVE TRANSPOSITION Left  January 2015   Social History   Occupational History   Occupation: retired  Tobacco Use   Smoking status: Former    Current packs/day: 0.00    Average packs/day: 1.5 packs/day for 5.0 years (7.5 ttl pk-yrs)    Types: Cigarettes    Start date: 61    Quit date: 1985    Years since quitting: 39.7    Passive exposure: Never   Smokeless tobacco: Never   Tobacco comments:    Quit 40+ years ago  Vaping Use   Vaping status: Never Used  Substance and Sexual Activity   Alcohol use: No    Alcohol/week: 0.0 standard  drinks of alcohol   Drug use: No   Sexual activity: Not on file

## 2023-08-19 ENCOUNTER — Other Ambulatory Visit: Payer: Self-pay

## 2023-08-19 DIAGNOSIS — I739 Peripheral vascular disease, unspecified: Secondary | ICD-10-CM

## 2023-08-25 ENCOUNTER — Encounter: Payer: Self-pay | Admitting: *Deleted

## 2023-08-28 NOTE — Therapy (Signed)
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   Arthritis    Coronary artery disease    Diverticula of colon    Dysrhythmia    PACs, PVCs   Heart disease    High blood pressure    High cholesterol    Hyperlipemia    Peripheral vascular disease (HCC)    Prostate cancer [facility])    Past Surgical History:  Procedure Laterality Date   ABDOMINAL AORTOGRAM W/LOWER EXTREMITY N/A 10/16/2017   Procedure: ABDOMINAL AORTOGRAM W/LOWER EXTREMITY;  Surgeon: Sherren Kerns, MD;  [facility]: [facility];  Service: Cardiovascular;  Laterality: N/A;   APPENDECTOMY     CARDIAC CATHETERIZATION N/A 11/13/2015   Procedure: Left Heart Cath and Coronary Angiography;  Surgeon: Dalia Heading, MD;  Location: [facility] INVASIVE CV LAB;  Service: Cardiovascular;  Laterality: N/A;   CATARACT EXTRACTION     COLON SURGERY  2008   right colon resection   COLONOSCOPY WITH PROPOFOL N/A 01/06/2018   Procedure: COLONOSCOPY WITH PROPOFOL;  Surgeon: Toledo, Boykin Nearing, MD;  Location: [facility] ENDOSCOPY;  Service: Gastroenterology;  Laterality: N/A;   ENDARTERECTOMY POPLITEAL Right 11/11/2017   FEMORAL-POPLITEAL BYPASS GRAFT Right 11/11/2017   Procedure: RIGHT POPLITEAL ENDARTERECTOMY;  Surgeon: Sherren Kerns, MD;  Location: [facility] OR;  Service: Vascular;  Laterality: Right;   LIPOMA EXCISION Right    thigh   METACARPOPHALANGEAL JOINT ARTHROPLASTY     PATCH ANGIOPLASTY Right 11/11/2017   Procedure: PATCH ANGIOPLASTY of Superficial Fenoral Artery;  Surgeon: Sherren Kerns, MD;  Location: [facility] OR;  Service: Vascular;  Laterality: Right;   PROSTATECTOMY  2003   ULNAR NERVE TRANSPOSITION Left January 2015   Patient Active Problem List   Diagnosis Date Noted   Primary hypertension 10/17/2021   PAD  (peripheral artery disease) (HCC) 10/09/2021   Hyperlipidemia LDL goal <70 10/08/2021   Other microscopic hematuria 10/08/2021   B12 deficiency 06/07/2021   Arthritis of right hand 04/11/2021   Achilles tendonitis, bilateral 03/16/2020   Bilateral Achilles tendonosis 05/16/2019   Claudication of calf muscles (HCC) 08/11/2017   Chronic bilateral low back pain with right-sided sciatica 08/11/2017   Carotid stenosis 05/05/2017   Presbycusis of both ears 03/20/2017   Pulsatile tinnitus of both ears 03/20/2017   H/O coronary artery bypass surgery 11/20/2015   Atherosclerotic heart disease of native coronary artery without angina pectoris 11/15/2015   HNP (herniated nucleus pulposus), lumbar 11/15/2014   Lumbar radiculitis 11/15/2014   Ulnar neuropathy of left upper extremity 10/13/2014   AC (acromioclavicular) arthritis 04/04/2013   CMC arthritis, thumb, degenerative 06/14/2012    PCP: Melida Quitter, MD  REFERRING PROVIDER: Tarry Kos, MD  REFERRING DIAG: [phone] (ICD-10-CM) - Facet joint disease  Rationale for Evaluation and Treatment: Rehabilitation  THERAPY DIAG:  No diagnosis found.  ONSET DATE: ***  SUBJECTIVE:  SUBJECTIVE STATEMENT: ***  PERTINENT HISTORY:  HTN, OA, Prostate Cancer, Prostatectomy 2003, Hx alcohol abuse (sober)  PAIN:  Are you having pain? Yes: NPRS scale: ***/10 Pain location: *** Pain description: *** Aggravating factors: *** Relieving factors: ***  PRECAUTIONS: {Therapy precautions:24002}  RED FLAGS: {PT Red Flags:29287}   WEIGHT BEARING RESTRICTIONS: {Yes ***/No:24003}  FALLS:  Has patient fallen in last 6 months? {fallsyesno:27318}  LIVING ENVIRONMENT: Lives with: {OPRC lives with:25569::"lives with their family"} Lives in: {Lives in:25570} Stairs:  {opstairs:27293} Has following equipment at home: {Assistive devices:23999}  OCCUPATION: ***  PLOF: Leisure: ***  PATIENT GOALS: ***  NEXT MD VISIT: ***  OBJECTIVE:   DIAGNOSTIC FINDINGS:  ***  PATIENT SURVEYS:  {rehab surveys:24030}  SCREENING FOR RED FLAGS: Bowel or bladder incontinence: {Yes/No:304960894} Spinal tumors: {Yes/No:304960894} Cauda equina syndrome: {Yes/No:304960894} Compression fracture: {Yes/No:304960894} Abdominal aneurysm: {Yes/No:304960894}  COGNITION: Overall cognitive status: {cognition:24006}     SENSATION: {sensation:27233}  MUSCLE LENGTH: Hamstrings: Right *** deg; Left *** deg Thomas test: Right *** deg; Left *** deg  POSTURE: {posture:25561}  PALPATION: ***  LUMBAR ROM:   AROM eval  Flexion   Extension   Right lateral flexion   Left lateral flexion   Right rotation   Left rotation    (Blank rows = not tested)  LOWER EXTREMITY ROM:     {AROM/PROM:27142}  Right eval Left eval  Hip flexion    Hip extension    Hip abduction    Hip adduction    Hip internal rotation    Hip external rotation    Knee flexion    Knee extension    Ankle dorsiflexion    Ankle plantarflexion    Ankle inversion    Ankle eversion     (Blank rows = not tested)  LOWER EXTREMITY MMT:    MMT Right eval Left eval  Hip flexion    Hip extension    Hip abduction    Hip adduction    Hip internal rotation    Hip external rotation    Knee flexion    Knee extension    Ankle dorsiflexion    Ankle plantarflexion    Ankle inversion    Ankle eversion     (Blank rows = not tested)  LUMBAR SPECIAL TESTS:  {lumbar special test:25242}  FUNCTIONAL TESTS:  {Functional tests:24029}  GAIT: Distance walked: *** Assistive device utilized: {Assistive devices:23999} Level of assistance: {Levels of assistance:24026} Comments: ***  TODAY'S TREATMENT:                                                                                                                               DATE: ***    PATIENT EDUCATION:  Education details: *** Person educated: {Person educated:25204} Education method: {Education Method:25205} Education comprehension: {Education Comprehension:25206}  HOME EXERCISE PROGRAM: ***  ASSESSMENT:  CLINICAL IMPRESSION: Patient is a *** y.o. *** who was seen today for physical therapy evaluation and treatment for ***.   OBJECTIVE IMPAIRMENTS: {  opptimpairments:25111}.   ACTIVITY LIMITATIONS: {activitylimitations:27494}  PARTICIPATION LIMITATIONS: {participationrestrictions:25113}  PERSONAL FACTORS: {Personal factors:25162} are also affecting patient's functional outcome.   REHAB POTENTIAL: {rehabpotential:25112}  CLINICAL DECISION MAKING: {clinical decision making:25114}  EVALUATION COMPLEXITY: {Evaluation complexity:25115}   GOALS: Goals reviewed with patient? Yes  SHORT TERM GOALS: Target date: *** Patient will be independent with initial HEP. Baseline:  Goal status: INITIAL  2.  Patient will report > or = to *** improvement in ***. Baseline:  Goal status: INITIAL   3.  *** Baseline:  Goal status: INITIAL  4.  *** Baseline:  Goal status: INITIAL  5.  *** Baseline:  Goal status: INITIAL  6.  *** Baseline:  Goal status: INITIAL  LONG TERM GOALS: Target date: *** Patient will demonstrate independence in advanced HEP. Baseline:  Goal status: INITIAL  2.  Patient will report > or = to *** improvement in ***. Baseline:  Goal status: INITIAL   3.  *** Baseline:  Goal status: INITIAL  4.  *** Baseline:  Goal status: INITIAL  5.  *** Baseline:  Goal status: INITIAL  6.  *** Baseline:  Goal status: INITIAL  PLAN:  PT FREQUENCY: {rehab frequency:25116}  PT DURATION: {rehab duration:25117}  PLANNED INTERVENTIONS: Therapeutic exercises, Therapeutic activity, Neuromuscular re-education, Balance training, Gait training, Patient/Family education, Self Care, Joint  mobilization, Joint manipulation, Stair training, Vestibular training, Canalith repositioning, Aquatic Therapy, Dry Needling, Electrical stimulation, Spinal manipulation, Spinal mobilization, Cryotherapy, Moist heat, Taping, Vasopneumatic device, Traction, Ultrasound, Ionotophoresis 4mg /ml Dexamethasone, and Manual therapy.  PLAN FOR NEXT SESSION: ***   Montel Clock 08/28/2023, 9:26 AM

## 2023-08-31 ENCOUNTER — Encounter: Payer: Self-pay | Admitting: Physical Therapy

## 2023-08-31 ENCOUNTER — Ambulatory Visit (HOSPITAL_BASED_OUTPATIENT_CLINIC_OR_DEPARTMENT_OTHER): Payer: Medicare Other | Admitting: Orthopaedic Surgery

## 2023-08-31 ENCOUNTER — Other Ambulatory Visit: Payer: Self-pay

## 2023-08-31 ENCOUNTER — Ambulatory Visit: Payer: Medicare Other | Attending: Orthopaedic Surgery | Admitting: Physical Therapy

## 2023-08-31 DIAGNOSIS — M25552 Pain in left hip: Secondary | ICD-10-CM | POA: Diagnosis present

## 2023-08-31 DIAGNOSIS — R2689 Other abnormalities of gait and mobility: Secondary | ICD-10-CM | POA: Diagnosis not present

## 2023-08-31 DIAGNOSIS — M6281 Muscle weakness (generalized): Secondary | ICD-10-CM | POA: Insufficient documentation

## 2023-08-31 DIAGNOSIS — M62838 Other muscle spasm: Secondary | ICD-10-CM | POA: Insufficient documentation

## 2023-08-31 DIAGNOSIS — M47819 Spondylosis without myelopathy or radiculopathy, site unspecified: Secondary | ICD-10-CM | POA: Insufficient documentation

## 2023-09-03 ENCOUNTER — Ambulatory Visit: Payer: Medicare Other | Attending: Orthopaedic Surgery | Admitting: Physical Therapy

## 2023-09-03 DIAGNOSIS — R2689 Other abnormalities of gait and mobility: Secondary | ICD-10-CM | POA: Diagnosis present

## 2023-09-03 DIAGNOSIS — M62838 Other muscle spasm: Secondary | ICD-10-CM | POA: Diagnosis present

## 2023-09-03 DIAGNOSIS — M25552 Pain in left hip: Secondary | ICD-10-CM | POA: Diagnosis present

## 2023-09-03 DIAGNOSIS — M6281 Muscle weakness (generalized): Secondary | ICD-10-CM | POA: Diagnosis present

## 2023-09-03 NOTE — Patient Instructions (Signed)

## 2023-09-03 NOTE — Therapy (Signed)
OUTPATIENT PHYSICAL THERAPY LOWER EXTREMITY PROGRESS NOTE   Patient Name: Willie Burton MRN: [id number] DOB:[birth year], 77 y.o., male Today's Date: 09/03/2023  END OF SESSION:  PT End of Session - 09/03/23 1402     Visit Number 2    Date for PT Re-Evaluation 10/26/23    Authorization Type Medicare/ [employer] Blue [employer]    Authorization Time Period Kx modifier after 15th visit    Progress Note Due on Visit 10    PT Start Time 1400    PT Stop Time 1440    PT Time Calculation (min) 40 min    Activity Tolerance Patient tolerated treatment well             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   Arthritis    Coronary artery disease    Diverticula of colon    Dysrhythmia    PACs, PVCs   Heart disease    High blood pressure    High cholesterol    Hyperlipemia    Peripheral vascular disease (HCC)    Prostate cancer (HCC)    Past Surgical History:  Procedure Laterality Date   ABDOMINAL AORTOGRAM W/LOWER EXTREMITY N/A 10/16/2017   Procedure: ABDOMINAL AORTOGRAM W/LOWER EXTREMITY;  Surgeon: Sherren Kerns, MD;  [facility];  Service: Cardiovascular;  Laterality: N/A;   APPENDECTOMY     CARDIAC CATHETERIZATION N/A 11/13/2015   Procedure: Left Heart Cath and Coronary Angiography;  Surgeon: Dalia Heading, MD;  Location: [facility] INVASIVE CV LAB;  Service: Cardiovascular;  Laterality: N/A;   CATARACT EXTRACTION     COLON SURGERY  2008   right colon resection   COLONOSCOPY WITH PROPOFOL N/A 01/06/2018   Procedure: COLONOSCOPY WITH PROPOFOL;  Surgeon: Toledo, Boykin Nearing, MD;  Location: [facility] END[facility]COPY;  Service: Gastroenterology;  Laterality: N/A;   ENDARTERECTOMY POPLITEAL Right 11/11/2017   FEMORAL-POPLITEAL BYPASS GRAFT Right 11/11/2017   Procedure: RIGHT POPLITEAL ENDARTERECTOMY;  Surgeon: Sherren Kerns, MD;  Location: [facility] OR;  Service: Vascular;  Laterality: Right;   LIPOMA EXCISION Right    thigh    METACARPOPHALANGEAL JOINT ARTHROPLASTY     PATCH ANGIOPLASTY Right 11/11/2017   Procedure: PATCH ANGIOPLASTY of Superficial Fenoral Artery;  Surgeon: Sherren Kerns, MD;  Location: [facility] OR;  Service: Vascular;  Laterality: Right;   PROSTATECTOMY  2003   ULNAR NERVE TRANSPOSITION Left January 2015   Patient Active Problem List   Diagnosis Date Noted   Primary hypertension 10/17/2021   PAD (peripheral artery disease) (HCC) 10/09/2021   Hyperlipidemia LDL goal <70 10/08/2021   Other microscopic hematuria 10/08/2021   B12 deficiency 06/07/2021   Arthritis of right hand 04/11/2021   Achilles tendonitis, bilateral 03/16/2020   Bilateral Achilles tendonosis 05/16/2019   Claudication of calf muscles (HCC) 08/11/2017   Chronic bilateral low back pain with right-sided sciatica 08/11/2017   Carotid stenosis 05/05/2017   Presbycusis of both ears 03/20/2017   Pulsatile tinnitus of both ears 03/20/2017   H/O coronary artery bypass surgery 11/20/2015   Atherosclerotic heart disease of native coronary artery without angina pectoris 11/15/2015   HNP (herniated nucleus pulposus), lumbar 11/15/2014   Lumbar radiculitis 11/15/2014   Ulnar neuropathy of left upper extremity 10/13/2014   AC (acromioclavicular) arthritis 04/04/2013   CMC arthritis, thumb, degenerative 06/14/2012    PCP: Melida Quitter, MD  REFERRING PROVIDER: Tarry Kos, MD  REFERRING DIAG: [phone] (ICD-10-CM) - Facet joint disease  Rationale for Evaluation and Treatment: Rehabilitation  THERAPY DIAG:  Pain in left hip  Muscle weakness (generalized)  ONSET DATE: 03/2023  SUBJECTIVE:                                                                                                                                                                                           SUBJECTIVE STATEMENT: Been trimming some bushes.  Just finished some PT with [facility] and that produced my (lateral) hip pain.  The bridge might have  bothered my hip a little, the clam was OK.    Had DN in the past on right ITB several years ago (positive response)    Eval: Patient reports Lt sided SIJ pain that began 03/2023. He went to [facility] and they did Xrays which were unremarkable. He went to PT for hip strengthening but it did not solve his pain he was having. He began having Lt hip pain as well as the buttock pain after completing around of PT. He normally walks 2.5-3 miles/ day 7 days a week but with the pain he was only able to tolerate 1.5 miles. He was then referred for an MRI and that is when he was diagnoses with a Lt gluteal minimus tear. The referring provider told him not to worry much about the tear and it is normal for his age. He reports always having a chronic limp but it has recently gotten worse. Recently he has not been walking for exercise, but he has been traveling outside of the country. Pt denies leg numbness & tingling. 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 He states that the pain is 3 out of 10 at most. This causes him to walk with a Trendelenburg gait but he is also not interested in surgery. His main complaint is left-sided low back pain.  PAIN:  Are you having pain? Yes: NPRS scale: 4 (currently) 7(worst)/10 Pain location: Lt SIJ and Lt glute area Pain description: achy Aggravating factors: Walking >.25 miles; sleeping Relieving factors: Tylenol (helps a little bit)  PRECAUTIONS: None  RED FLAGS: None   WEIGHT BEARING RESTRICTIONS: No  FALLS:  Has patient fallen in last 6 months? No  LIVING ENVIRONMENT: Lives with: lives with their family and lives with their spouse Lives in: House/apartment Stairs: No Has following equipment at home:  None  OCCUPATION: Retired  PLOF:Independent Leisure: Traveling   PATIENT GOALS: Get rid of this  pain so I can walk and keep active  NEXT MD VISIT: PRN  OBJECTIVE:   DIAGNOSTIC FINDINGS:  Recently had an MRI at [facility] that showed 90% left gluteus minimus tear  PATIENT SURVEYS:  FOTO 58; goal: 62  SCREENING FOR RED FLAGS: Bowel or bladder incontinence: No Spinal tumors: No Cauda equina syndrome: No Compression fracture: No Abdominal aneurysm: No  COGNITION: Overall cognitive status: Within functional limits for tasks assessed      POSTURE: rounded shoulders and posterior pelvic tilt  PALPATION:  PA to SIJ reproduced pain Tenderness & taut bands to Lt gluteal complex    LOWER EXTREMITY ROM:     Passive  Right eval Left eval  Hip flexion [facility]  Hip extension [facility]  Hip abduction [facility] WFL  Hip adduction [facility]  Hip internal rotation    Hip external rotation    Knee flexion [facility] WFL  Knee extension [facility] WFL  Ankle dorsiflexion    Ankle plantarflexion    Ankle inversion    Ankle eversion     (Blank rows = not tested)  LOWER EXTREMITY MMT:    MMT Right eval Left eval  Hip flexion 5 5  Hip extension 4 4  Hip abduction 4 4  Hip adduction 4- 4-  Hip internal rotation    Hip external rotation    Knee flexion 5 5  Knee extension 5 5  Ankle dorsiflexion    Ankle plantarflexion    Ankle inversion    Ankle eversion     (Blank rows = not tested)  LUMBAR SPECIAL TESTS:  SI Compression/distraction test: Negative  FUNCTIONAL TESTS:  5 times sit to stand: 10.35 6 minute walk test: 1390ft  GAIT: Distance walked: 1325 Assistive device utilized: None Level of assistance: Complete Independence Comments: Antalgic gait; increased Lt gluteal min pain after 1.30 min that got worse as we continued to walk  TODAY'S TREATMENT:                                                                                                                               DATE:  09/03/2023 :  Nu-Step L4 (green machine) 7 min while discussing status Review of initial HEP: discomfort with single leg bridging, clams no problem, quadruped fire hydrant challenging but good Supine piriformis stretch 30 sec hold Pigeon pose stretch 30 sec Seated piriformis stretch 30 sec Standing leaning on elbow on table: hip abduction, hip extension, hip extension with bent knee right/left 2 sets of 5 Manual therapy: left hip joint mobs grade 3 30 sec each: long axis distraction, inferior, AP in internal rotation; soft tissue mobilization to gluteals Trigger Point Dry-Needling  Treatment instructions: Expect mild to moderate muscle soreness. S/S of pneumothorax if dry needled over a lung field, and to seek immediate medical attention should they occur. Patient verbalized understanding of these instructions and education.  Patient Consent Given: Yes Education handout provided: Yes Muscles treated: left upper gluteals  in sidelying Electrical stimulation performed: No Parameters: N/A Treatment response/outcome: improved soft tissue length and decreased tender point size and number   PATIENT EDUCATION:  Education details: Hip weakness; hip strengthening exercises; POC Person educated: Patient Education method: Explanation, Demonstration, and Handouts Education comprehension: verbalized understanding, returned demonstration, and needs further education  HOME EXERCISE PROGRAM: Access Code: [email or web address] URL: [email or web address] Date: 09/03/2023 Prepared by: Lavinia Sharps  Exercises - Clamshell with Resistance  - 1 x daily - 7 x weekly - 1 sets - 10 reps - Quadruped Fire Hydrant  - 1 x daily - 7 x weekly - 1 sets - 10 reps - Hip External Rotation Stretch  - 1 x daily - 7 x weekly - 1 sets - 3 reps - 30 hold - Seated Table Piriformis Stretch  - 1 x daily - 7 x weekly - 1 sets - 3 reps - 30 hold - Standing Hip Abduction with Counter Support  - 2 x daily - 7 x weekly  - 2 sets - 10 reps - Standing Hip Extension with Counter Support  - 2 x daily - 7 x weekly - 2 sets - 10 reps - Standing Hip Extension with Leg Bent and Support  - 2 x daily - 7 x weekly - 2 sets - 10 reps   ASSESSMENT:  CLINICAL IMPRESSION: The patient reports he was previously doing about 1 1/2 hours of exercise and wonders if this could have produced this newer onset of lateral hip pain.  Decreased hip capsular mobility in posterior and inferior directions.  He states he had DN several years ago for a right ITB issue and is open to trying DN today.  Good initial response with decreased tender points and soft tissue mobility noted.  The patient was encouraged in regular performance of HEP post DN including soft tissue lengthening and strengthening exercises to enhance long term benefit.   OBJECTIVE IMPAIRMENTS: Abnormal gait, difficulty walking, decreased strength, increased muscle spasms, and pain.   ACTIVITY LIMITATIONS: sleeping and walking >.25 miles  PARTICIPATION LIMITATIONS: community activity and walking tolerance while traveling  PERSONAL FACTORS: Age and 3+ comorbidities: HTN, OA, Hx of cancer  are also affecting patient's functional outcome.   REHAB POTENTIAL: Good  CLINICAL DECISION MAKING: Stable/uncomplicated  EVALUATION COMPLEXITY: Low   GOALS: Goals reviewed with patient? Yes  SHORT TERM GOALS: Target date: 09/28/2023   Patient will be independent with initial HEP. Baseline:  Goal status: INITIAL  2.  Patient will be able to walk 1.75 miles with < or = to 5/10 Lt hip/ buttock pain. Baseline: 1.5 miles; 7/10 pain Goal status: INITIAL    LONG TERM GOALS: Target date: 10/26/2023  Patient will demonstrate independence in advanced HEP. Baseline:  Goal status: INITIAL  2.  Patient will be able to walk 3 miles with < or = to 4/10 Lt hip/ buttock pain. Baseline: 1.5 miles; 7/10 pain Goal status: INITIAL   3.  Patient's FOTO score will increase to 69 for  improved function. Baseline: 58 Goal status: INITIAL  4.  Patient will report waking up < or = to 2x/night with hip pain.  Baseline: frequently Goal status: INITIAL   PLAN:  PT FREQUENCY: 2x/week  PT DURATION: 8 weeks  PLANNED INTERVENTIONS: Therapeutic exercises, Therapeutic activity, Neuromuscular re-education, Balance training, Gait training, Patient/Family education, Self Care, Joint mobilization, Joint manipulation, Stair training, Vestibular training, Canalith repositioning, Aquatic Therapy, Dry Needling, Electrical stimulation, Spinal manipulation, Spinal mobilization, Cryotherapy, Moist heat, Taping, Vasopneumatic device, Traction, Ultrasound, Ionotophoresis  4mg /ml Dexamethasone, and Manual therapy.  PLAN FOR NEXT SESSION: Review HEP, single leg balance, LE strengthening; assess response to DN   Lavinia Sharps, PT 09/03/23 5:53 PM Phone: [phone] Fax: [phone]   [facility] [address], [address] [place], [place] [address] Phone # [phone] Fax [phone]

## 2023-09-09 ENCOUNTER — Ambulatory Visit: Payer: Medicare Other | Admitting: Physical Therapy

## 2023-09-09 ENCOUNTER — Encounter: Payer: Self-pay | Admitting: Physical Therapy

## 2023-09-09 DIAGNOSIS — M25552 Pain in left hip: Secondary | ICD-10-CM | POA: Diagnosis not present

## 2023-09-09 DIAGNOSIS — M6281 Muscle weakness (generalized): Secondary | ICD-10-CM

## 2023-09-09 DIAGNOSIS — R2689 Other abnormalities of gait and mobility: Secondary | ICD-10-CM

## 2023-09-09 DIAGNOSIS — M62838 Other muscle spasm: Secondary | ICD-10-CM

## 2023-09-09 NOTE — Therapy (Signed)
OUTPATIENT PHYSICAL THERAPY LOWER EXTREMITY TREATMENT NOTE   Patient Name: Willie Burton MRN: [id number] DOB:Jul 23, 1946, 77 y.o., male Today's Date: 09/09/2023  END OF SESSION:  PT End of Session - 09/09/23 0946     Visit Number 3    Date for PT Re-Evaluation 10/26/23    Authorization Type Medicare/ [employer]    Authorization Time Period Kx modifier after 15th visit    Progress Note Due on Visit 10    PT Start Time 0850    PT Stop Time 0930    PT Time Calculation (min) 40 min    Activity Tolerance Patient tolerated treatment well    Behavior During Therapy [facility] for tasks assessed/performed              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   Arthritis    Coronary artery disease    Diverticula of colon    Dysrhythmia    PACs, PVCs   Heart disease    High blood pressure    High cholesterol    Hyperlipemia    Peripheral vascular disease (HCC)    Prostate cancer (HCC)    Past Surgical History:  Procedure Laterality Date   ABDOMINAL AORTOGRAM W/LOWER EXTREMITY N/A 10/16/2017   Procedure: ABDOMINAL AORTOGRAM W/LOWER EXTREMITY;  Surgeon: Sherren Kerns, MD;  Location: [facility] INVASIVE CV LAB;  Service: Cardiovascular;  Laterality: N/A;   APPENDECTOMY     CARDIAC CATHETERIZATION N/A 11/13/2015   Procedure: Left Heart Cath and Coronary Angiography;  Surgeon: Dalia Heading, MD;  Location: [facility] INVASIVE CV LAB;  Service: Cardiovascular;  Laterality: N/A;   CATARACT EXTRACTION     COLON SURGERY  2008   right colon resection   COLONOSCOPY WITH PROPOFOL N/A 01/06/2018   Procedure: COLONOSCOPY WITH PROPOFOL;  Surgeon: Toledo, Boykin Nearing, MD;  Location: [facility] ENDOSCOPY;  Service: Gastroenterology;  Laterality: N/A;   ENDARTERECTOMY POPLITEAL Right 11/11/2017   FEMORAL-POPLITEAL BYPASS GRAFT Right 11/11/2017   Procedure: RIGHT POPLITEAL ENDARTERECTOMY;  Surgeon: Sherren Kerns, MD;  Location: [facility] OR;  Service: Vascular;   Laterality: Right;   LIPOMA EXCISION Right    thigh   METACARPOPHALANGEAL JOINT ARTHROPLASTY     PATCH ANGIOPLASTY Right 11/11/2017   Procedure: PATCH ANGIOPLASTY of Superficial Fenoral Artery;  Surgeon: Sherren Kerns, MD;  Location: [facility] OR;  Service: Vascular;  Laterality: Right;   PROSTATECTOMY  2003   ULNAR NERVE TRANSPOSITION Left January 2015   Patient Active Problem List   Diagnosis Date Noted   Primary hypertension 10/17/2021   PAD (peripheral artery disease) (HCC) 10/09/2021   Hyperlipidemia LDL goal <70 10/08/2021   Other microscopic hematuria 10/08/2021   B12 deficiency 06/07/2021   Arthritis of right hand 04/11/2021   Achilles tendonitis, bilateral 03/16/2020   Bilateral Achilles tendonosis 05/16/2019   Claudication of calf muscles (HCC) 08/11/2017   Chronic bilateral low back pain with right-sided sciatica 08/11/2017   Carotid stenosis 05/05/2017   Presbycusis of both ears 03/20/2017   Pulsatile tinnitus of both ears 03/20/2017   H/O coronary artery bypass surgery 11/20/2015   Atherosclerotic heart disease of native coronary artery without angina pectoris 11/15/2015   HNP (herniated nucleus pulposus), lumbar 11/15/2014   Lumbar radiculitis 11/15/2014   Ulnar neuropathy of left upper extremity 10/13/2014   AC (acromioclavicular) arthritis 04/04/2013   CMC arthritis, thumb, degenerative 06/14/2012    PCP: Melida Quitter, MD  REFERRING PROVIDER: Tarry Kos,  MD  REFERRING DIAG: M47.819 (ICD-10-CM) - Facet joint disease  Rationale for Evaluation and Treatment: Rehabilitation  THERAPY DIAG:  Pain in left hip  Muscle weakness (generalized)  Other muscle spasm  Other abnormalities of gait and mobility  ONSET DATE: 03/2023  SUBJECTIVE:                                                                                                                                                                                           SUBJECTIVE STATEMENT: Patient reports  he is doing good today. Dry needling last treatment session was very helpful and his lateral hip pain is almost all gone. Currently his pain is 2/10.    Eval: Patient reports Lt sided SIJ pain that began 03/2023. He went to [facility] and they did Xrays which were unremarkable. He went to PT for hip strengthening but it did not solve his pain he was having. He began having Lt hip pain as well as the buttock pain after completing around of PT. He normally walks 2.5-3 miles/ day 7 days a week but with the pain he was only able to tolerate 1.5 miles. He was then referred for an MRI and that is when he was diagnoses with a Lt gluteal minimus tear. The referring provider told him not to worry much about the tear and it is normal for his age. He reports always having a chronic limp but it has recently gotten worse. Recently he has not been walking for exercise, but he has been traveling outside of the [place]. Pt denies leg numbness & tingling. Functional activity limitations: walking >.25 miles, sleeping   PERTINENT HISTORY:  90% Lt Gluteus Minimus Tear, HTN, OA, Prostate Cancer, Prostatectomy 2003, Hx alcohol abuse (20 yrs sober)  From Doctor's Note:Recently had an MRI at [facility] that showed 90% left gluteus minimus tear. He states that the pain is 3 out of 10 at most. This causes him to walk with a Trendelenburg gait but he is also not interested in surgery. His main complaint is left-sided low back pain.  PAIN:  Are you having pain? Yes: NPRS scale: 4 (currently) 7(worst)/10 Pain location: Lt SIJ and Lt glute area Pain description: achy Aggravating factors: Walking >.25 miles; sleeping Relieving factors: Tylenol (helps a little bit)  PRECAUTIONS: None  RED FLAGS: None   WEIGHT BEARING RESTRICTIONS: No  FALLS:  Has patient fallen in last 6 months? No  LIVING ENVIRONMENT: Lives with: lives with their family and lives with their spouse Lives in: House/apartment Stairs: No Has following  equipment at home: None  OCCUPATION: Retired  PLOF:Independent Leisure: Traveling   PATIENT GOALS: Get  rid of this pain so I can walk and keep active  NEXT MD VISIT: PRN  OBJECTIVE:   DIAGNOSTIC FINDINGS:  Recently had an MRI at [facility] that showed 90% left gluteus minimus tear  PATIENT SURVEYS:  FOTO 58; goal: 64  SCREENING FOR RED FLAGS: Bowel or bladder incontinence: No Spinal tumors: No Cauda equina syndrome: No Compression fracture: No Abdominal aneurysm: No  COGNITION: Overall cognitive status: Within functional limits for tasks assessed      POSTURE: rounded shoulders and posterior pelvic tilt  PALPATION:  PA to SIJ reproduced pain Tenderness & taut bands to Lt gluteal complex    LOWER EXTREMITY ROM:     Passive  Right eval Left eval  Hip flexion [facility]  Hip extension [facility]  Hip abduction [facility] WFL  Hip adduction [facility]  Hip internal rotation    Hip external rotation    Knee flexion [facility] WFL  Knee extension [facility] WFL  Ankle dorsiflexion    Ankle plantarflexion    Ankle inversion    Ankle eversion     (Blank rows = not tested)  LOWER EXTREMITY MMT:    MMT Right eval Left eval  Hip flexion 5 5  Hip extension 4 4  Hip abduction 4 4  Hip adduction 4- 4-  Hip internal rotation    Hip external rotation    Knee flexion 5 5  Knee extension 5 5  Ankle dorsiflexion    Ankle plantarflexion    Ankle inversion    Ankle eversion     (Blank rows = not tested)  LUMBAR SPECIAL TESTS:  SI Compression/distraction test: Negative  FUNCTIONAL TESTS:  5 times sit to stand: 10.35 6 minute walk test: 1382ft  GAIT: Distance walked: 1325 Assistive device utilized: None Level of assistance: Complete Independence Comments: Antalgic gait; increased Lt gluteal min pain after 1.30 min that got worse as we continued to walk  TODAY'S TREATMENT:                                                                                                                               DATE:  09/09/2023 :  Nu-Step L4 8 min while discussing status Reviewed updated HEP- performed all exercises correctly and required minimal verbal cues Pigeon pose stretch 30 sec each side Seated piriformis stretch 30 sec side Hand supported lunge 2x10 each leg Leg Press 2 inch Hip Hike 2x8 on Rt  Step Ups 6 inch + knee drive x 10 each leg  Manual: Attaday Lt gluteal muscles    09/03/2023 :  Nu-Step L4 (green machine) 7 min while discussing status Review of initial HEP: discomfort with single leg bridging, clams no problem, quadruped fire hydrant challenging but good Supine piriformis stretch 30 sec hold Pigeon pose stretch 30 sec Seated piriformis stretch 30 sec Standing leaning on elbow on table: hip abduction, hip extension, hip extension with bent knee right/left 2 sets of 5 Manual therapy: left  hip joint mobs grade 3 30 sec each: long axis distraction, inferior, AP in internal rotation; soft tissue mobilization to gluteals Trigger Point Dry-Needling  Treatment instructions: Expect mild to moderate muscle soreness. S/S of pneumothorax if dry needled over a lung field, and to seek immediate medical attention should they occur. Patient verbalized understanding of these instructions and education.  Patient Consent Given: Yes Education handout provided: Yes Muscles treated: left upper gluteals in sidelying Electrical stimulation performed: No Parameters: N/A Treatment response/outcome: improved soft tissue length and decreased tender point size and number   PATIENT EDUCATION:  Education details: Hip weakness; hip strengthening exercises; POC Person educated: Patient Education method: Explanation, Demonstration, and Handouts Education comprehension: verbalized understanding, returned demonstration, and needs further education  HOME EXERCISE PROGRAM: Access Code: [email or web address] URL: [email or web address] Date: 09/09/2023 Prepared by: Claude Manges  Exercises - Standing Hip Abduction with Counter Support  - 1 x daily - 7 x weekly - 2 sets - 10 reps - Standing Hip Extension with Counter Support  - 1 x daily - 7 x weekly - 2 sets - 10 reps - Standing Hip Extension with Leg Bent and Support  - 1 x daily - 7 x weekly - 2 sets - 10 reps - Clamshell with Resistance  - 1 x daily - 7 x weekly - 1 sets - 10 reps - Seated Piriformis Stretch with Trunk Bend  - 1 x daily - 7 x weekly - 2 sets - 3 reps - 30 hold - Quadruped Fire Hydrant  - 1 x daily - 7 x weekly - 1 sets - 10 reps - Standing Hip ER Stretch at Table  - 1 x daily - 7 x weekly - 1 sets - 10 reps - 30 hold   ASSESSMENT:  CLINICAL IMPRESSION: Today's treatment session focused on hip strengthening and single leg balance. Educated patient on gait mechanics of why he feels he has to consciously pick up his Rt leg after walking > 10 minutes. Patient verbalized understanding. Incorporated single leg hip hike on 2 inch step and noted big difference between Rt and Lt leg. Patient required moderate verbal cues for for correction and correct muscle engagement. Patient will benefit from skilled PT to address the below impairments and improve overall function.   OBJECTIVE IMPAIRMENTS: Abnormal gait, difficulty walking, decreased strength, increased muscle spasms, and pain.   ACTIVITY LIMITATIONS: sleeping and walking >.25 miles  PARTICIPATION LIMITATIONS: community activity and walking tolerance while traveling  PERSONAL FACTORS: Age and 3+ comorbidities: HTN, OA, Hx of cancer  are also affecting patient's functional outcome.   REHAB POTENTIAL: Good  CLINICAL DECISION MAKING: Stable/uncomplicated  EVALUATION COMPLEXITY: Low   GOALS: Goals reviewed with patient? Yes  SHORT TERM GOALS: Target date: 09/28/2023   Patient will be independent with initial HEP. Baseline:  Goal status: INITIAL  2.  Patient will be able to walk 1.75 miles with < or = to 5/10 Lt hip/ buttock  pain. Baseline: 1.5 miles; 7/10 pain Goal status: INITIAL    LONG TERM GOALS: Target date: 10/26/2023  Patient will demonstrate independence in advanced HEP. Baseline:  Goal status: INITIAL  2.  Patient will be able to walk 3 miles with < or = to 4/10 Lt hip/ buttock pain. Baseline: 1.5 miles; 7/10 pain Goal status: INITIAL   3.  Patient's FOTO score will increase to 69 for improved function. Baseline: 58 Goal status: INITIAL  4.  Patient will report waking up < or = to 2x/night with hip  pain.  Baseline: frequently Goal status: INITIAL   PLAN:  PT FREQUENCY: 2x/week  PT DURATION: 8 weeks  PLANNED INTERVENTIONS: Therapeutic exercises, Therapeutic activity, Neuromuscular re-education, Balance training, Gait training, Patient/Family education, Self Care, Joint mobilization, Joint manipulation, Stair training, Vestibular training, Canalith repositioning, Aquatic Therapy, Dry Needling, Electrical stimulation, Spinal manipulation, Spinal mobilization, Cryotherapy, Moist heat, Taping, Vasopneumatic device, Traction, Ultrasound, Ionotophoresis 4mg /ml Dexamethasone, and Manual therapy.  PLAN FOR NEXT SESSION: continue glute strengthening and single leg balance  Claude Manges, PT 09/09/23 9:47 AM  [facility] [address], [address] [place], [place] [address] Phone # [phone] Fax [phone]

## 2023-09-11 ENCOUNTER — Ambulatory Visit: Payer: Medicare Other | Admitting: Physical Therapy

## 2023-09-11 ENCOUNTER — Encounter: Payer: Self-pay | Admitting: Physical Therapy

## 2023-09-11 DIAGNOSIS — M25552 Pain in left hip: Secondary | ICD-10-CM

## 2023-09-11 DIAGNOSIS — R2689 Other abnormalities of gait and mobility: Secondary | ICD-10-CM

## 2023-09-11 DIAGNOSIS — M62838 Other muscle spasm: Secondary | ICD-10-CM

## 2023-09-11 DIAGNOSIS — M6281 Muscle weakness (generalized): Secondary | ICD-10-CM

## 2023-09-11 NOTE — Therapy (Signed)
OUTPATIENT PHYSICAL THERAPY LOWER EXTREMITY TREATMENT NOTE   Patient Name: Willie Burton MRN: [id number] DOB:June 26, 1946, 77 y.o., male Today's Date: 09/11/2023  END OF SESSION:  PT End of Session - 09/11/23 0930     Visit Number 4    Date for PT Re-Evaluation 10/26/23    Authorization Type Medicare/ [employer] Blue [employer]    Authorization Time Period Kx modifier after 15th visit    Progress Note Due on Visit 10    PT Start Time 0845    PT Stop Time 0928    PT Time Calculation (min) 43 min    Activity Tolerance Patient tolerated treatment well    Behavior During Therapy [facility] for tasks assessed/performed               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   Arthritis    Coronary artery disease    Diverticula of colon    Dysrhythmia    PACs, PVCs   Heart disease    High blood pressure    High cholesterol    Hyperlipemia    Peripheral vascular disease (HCC)    Prostate cancer (HCC)    Past Surgical History:  Procedure Laterality Date   ABDOMINAL AORTOGRAM W/LOWER EXTREMITY N/A 10/16/2017   Procedure: ABDOMINAL AORTOGRAM W/LOWER EXTREMITY;  Surgeon: Sherren Kerns, MD;  Location: [facility];  Service: Cardiovascular;  Laterality: N/A;   APPENDECTOMY     CARDIAC CATHETERIZATION N/A 11/13/2015   Procedure: Left Heart Cath and Coronary Angiography;  Surgeon: Dalia Heading, MD;  Location: [facility] INVASIVE CV LAB;  Service: Cardiovascular;  Laterality: N/A;   CATARACT EXTRACTION     COLON SURGERY  2008   right colon resection   COLONOSCOPY WITH PROPOFOL N/A 01/06/2018   Procedure: COLONOSCOPY WITH PROPOFOL;  Surgeon: Toledo, Boykin Nearing, MD;  Location: [facility] ENDOSCOPY;  Service: Gastroenterology;  Laterality: N/A;   ENDARTERECTOMY POPLITEAL Right 11/11/2017   FEMORAL-POPLITEAL BYPASS GRAFT Right 11/11/2017   Procedure: RIGHT POPLITEAL ENDARTERECTOMY;  Surgeon: Sherren Kerns, MD;  Location: [facility] OR;  Service:  Vascular;  Laterality: Right;   LIPOMA EXCISION Right    thigh   METACARPOPHALANGEAL JOINT ARTHROPLASTY     PATCH ANGIOPLASTY Right 11/11/2017   Procedure: PATCH ANGIOPLASTY of Superficial Fenoral Artery;  Surgeon: Sherren Kerns, MD;  Location: [facility];  Service: Vascular;  Laterality: Right;   PROSTATECTOMY  2003   ULNAR NERVE TRANSPOSITION Left January 2015   Patient Active Problem List   Diagnosis Date Noted   Primary hypertension 10/17/2021   PAD (peripheral artery disease) (HCC) 10/09/2021   Hyperlipidemia LDL goal <70 10/08/2021   Other microscopic hematuria 10/08/2021   B12 deficiency 06/07/2021   Arthritis of right hand 04/11/2021   Achilles tendonitis, bilateral 03/16/2020   Bilateral Achilles tendonosis 05/16/2019   Claudication of calf muscles (HCC) 08/11/2017   Chronic bilateral low back pain with right-sided sciatica 08/11/2017   Carotid stenosis 05/05/2017   Presbycusis of both ears 03/20/2017   Pulsatile tinnitus of both ears 03/20/2017   H/O coronary artery bypass surgery 11/20/2015   Atherosclerotic heart disease of native coronary artery without angina pectoris 11/15/2015   HNP (herniated nucleus pulposus), lumbar 11/15/2014   Lumbar radiculitis 11/15/2014   Ulnar neuropathy of left upper extremity 10/13/2014   AC (acromioclavicular) arthritis 04/04/2013   CMC arthritis, thumb, degenerative 06/14/2012    PCP: Melida Quitter, MD  REFERRING PROVIDER: Gershon Mussel  M, MD  REFERRING DIAG: Z61.096 (ICD-10-CM) - Facet joint disease  Rationale for Evaluation and Treatment: Rehabilitation  THERAPY DIAG:  Pain in left hip  Muscle weakness (generalized)  Other muscle spasm  Other abnormalities of gait and mobility  ONSET DATE: 03/2023  SUBJECTIVE:                                                                                                                                                                                           SUBJECTIVE  STATEMENT: Patient reports he is doing okay today. He is not currently having any hip pain, but his quads are really sore after the lunges last treatment session.   Eval: Patient reports Lt sided SIJ pain that began 03/2023. He went to [facility] and they did Xrays which were unremarkable. He went to PT for hip strengthening but it did not solve his pain he was having. He began having Lt hip pain as well as the buttock pain after completing around of PT. He normally walks 2.5-3 miles/ day 7 days a week but with the pain he was only able to tolerate 1.5 miles. He was then referred for an MRI and that is when he was diagnoses with a Lt gluteal minimus tear. The referring provider told him not to worry much about the tear and it is normal for his age. He reports always having a chronic limp but it has recently gotten worse. Recently he has not been walking for exercise, but he has been traveling outside of the [place]. Pt denies leg numbness & tingling. Functional activity limitations: walking >.25 miles, sleeping   PERTINENT HISTORY:  90% Lt Gluteus Minimus Tear, HTN, OA, Prostate Cancer, Prostatectomy 2003, Hx alcohol abuse (20 yrs sober)  From Doctor's Note:Recently had an MRI at [facility] that showed 90% left gluteus minimus tear. He states that the pain is 3 out of 10 at most. This causes him to walk with a Trendelenburg gait but he is also not interested in surgery. His main complaint is left-sided low back pain.  PAIN:  Are you having pain? Yes: NPRS scale: 4 (currently) 7(worst)/10 Pain location: Lt SIJ and Lt glute area Pain description: achy Aggravating factors: Walking >.25 miles; sleeping Relieving factors: Tylenol (helps a little bit)  PRECAUTIONS: None  RED FLAGS: None   WEIGHT BEARING RESTRICTIONS: No  FALLS:  Has patient fallen in last 6 months? No  LIVING ENVIRONMENT: Lives with: lives with their family and lives with their spouse Lives in: House/apartment Stairs:  No Has following equipment at home: None  OCCUPATION: Retired  PLOF:Independent Leisure: Traveling   PATIENT GOALS: Get rid of  this pain so I can walk and keep active  NEXT MD VISIT: PRN  OBJECTIVE:   DIAGNOSTIC FINDINGS:  Recently had an MRI at [facility] that showed 90% left gluteus minimus tear  PATIENT SURVEYS:  FOTO 58; goal: 11  SCREENING FOR RED FLAGS: Bowel or bladder incontinence: No Spinal tumors: No Cauda equina syndrome: No Compression fracture: No Abdominal aneurysm: No  COGNITION: Overall cognitive status: Within functional limits for tasks assessed      POSTURE: rounded shoulders and posterior pelvic tilt  PALPATION:  PA to SIJ reproduced pain Tenderness & taut bands to Lt gluteal complex    LOWER EXTREMITY ROM:     Passive  Right eval Left eval  Hip flexion [facility]  Hip extension [facility]  Hip abduction [facility] WFL  Hip adduction [facility]  Hip internal rotation    Hip external rotation    Knee flexion [facility] WFL  Knee extension [facility] WFL  Ankle dorsiflexion    Ankle plantarflexion    Ankle inversion    Ankle eversion     (Blank rows = not tested)  LOWER EXTREMITY MMT:    MMT Right eval Left eval  Hip flexion 5 5  Hip extension 4 4  Hip abduction 4 4  Hip adduction 4- 4-  Hip internal rotation    Hip external rotation    Knee flexion 5 5  Knee extension 5 5  Ankle dorsiflexion    Ankle plantarflexion    Ankle inversion    Ankle eversion     (Blank rows = not tested)  LUMBAR SPECIAL TESTS:  SI Compression/distraction test: Negative  FUNCTIONAL TESTS:  5 times sit to stand: 10.35 6 minute walk test: 1336ft  GAIT: Distance walked: 1325 Assistive device utilized: None Level of assistance: Complete Independence Comments: Antalgic gait; increased Lt gluteal min pain after 1.30 min that got worse as we continued to walk  TODAY'S TREATMENT:                                                                                                                               DATE:  09/11/2023 :  Nu-Step L4 7 min while discussing status Lateral Walks & Monster Walks yellow loop x 4  2 inch Hip Hike 2x8 on Rt  Leg Press bilateral 110 lbs unilateral 55 lbs 2 x 10 each Step Ups 6 inch unilateral 5# KB hold 2 x 10 each leg  STS with 5 lb DB 2 x 8 Y Balance (2 cones in front one behind) 2 x 10 each min UE support on barre Sitting piriformis stretch 2 x 30 sec Rt  Standing Quad stretch on chair 2 x 30 sec bilateral  Manual: Attaday Lt gluteal muscles    09/09/2023 :  Nu-Step L4 8 min while discussing status Reviewed updated HEP- performed all exercises correctly and required minimal verbal cues Pigeon pose stretch 30 sec each side Seated piriformis stretch 30 sec side  Hand supported lunge 2x10 each leg Leg Press 2 inch Hip Hike 2x8 on Rt  Step Ups 6 inch + knee drive x 10 each leg  Manual: Attaday Lt gluteal muscles    09/03/2023 :  Nu-Step L4 (green machine) 7 min while discussing status Review of initial HEP: discomfort with single leg bridging, clams no problem, quadruped fire hydrant challenging but good Supine piriformis stretch 30 sec hold Pigeon pose stretch 30 sec Seated piriformis stretch 30 sec Standing leaning on elbow on table: hip abduction, hip extension, hip extension with bent knee right/left 2 sets of 5 Manual therapy: left hip joint mobs grade 3 30 sec each: long axis distraction, inferior, AP in internal rotation; soft tissue mobilization to gluteals Trigger Point Dry-Needling  Treatment instructions: Expect mild to moderate muscle soreness. S/S of pneumothorax if dry needled over a lung field, and to seek immediate medical attention should they occur. Patient verbalized understanding of these instructions and education.  Patient Consent Given: Yes Education handout provided: Yes Muscles treated: left upper gluteals in sidelying Electrical stimulation performed: No Parameters: N/A Treatment  response/outcome: improved soft tissue length and decreased tender point size and number   PATIENT EDUCATION:  Education details: Hip weakness; hip strengthening exercises; POC Person educated: Patient Education method: Explanation, Demonstration, and Handouts Education comprehension: verbalized understanding, returned demonstration, and needs further education  HOME EXERCISE PROGRAM: Access Code: [email or web address] URL: [email or web address] Date: 09/09/2023 Prepared by: Claude Manges  Exercises - Standing Hip Abduction with Counter Support  - 1 x daily - 7 x weekly - 2 sets - 10 reps - Standing Hip Extension with Counter Support  - 1 x daily - 7 x weekly - 2 sets - 10 reps - Standing Hip Extension with Leg Bent and Support  - 1 x daily - 7 x weekly - 2 sets - 10 reps - Clamshell with Resistance  - 1 x daily - 7 x weekly - 1 sets - 10 reps - Seated Piriformis Stretch with Trunk Bend  - 1 x daily - 7 x weekly - 2 sets - 3 reps - 30 hold - Quadruped Fire Hydrant  - 1 x daily - 7 x weekly - 1 sets - 10 reps - Standing Hip ER Stretch at Table  - 1 x daily - 7 x weekly - 1 sets - 10 reps - 30 hold   ASSESSMENT:  CLINICAL IMPRESSION: Today's treatment session focused on hip strengthening. Incorporated lateral band walks and monster walks and patient verbalized exercises were a good challenge. He required moderate verbal and visual cues for correct performance. Hip hike exercises remains to be challenging for patient due to glue med weakness. Patient verbalized feeling like exercises were effective. Y Balance exercise was more challenging with patient's Lt leg compared to right. Patient will benefit from skilled PT to address the below impairments and improve overall function.    OBJECTIVE IMPAIRMENTS: Abnormal gait, difficulty walking, decreased strength, increased muscle spasms, and pain.   ACTIVITY LIMITATIONS: sleeping and walking >.25 miles  PARTICIPATION LIMITATIONS: community  activity and walking tolerance while traveling  PERSONAL FACTORS: Age and 3+ comorbidities: HTN, OA, Hx of cancer  are also affecting patient's functional outcome.   REHAB POTENTIAL: Good  CLINICAL DECISION MAKING: Stable/uncomplicated  EVALUATION COMPLEXITY: Low   GOALS: Goals reviewed with patient? Yes  SHORT TERM GOALS: Target date: 09/28/2023   Patient will be independent with initial HEP. Baseline:  Goal status: INITIAL  2.  Patient will be able to walk  1.75 miles with < or = to 5/10 Lt hip/ buttock pain. Baseline: 1.5 miles; 7/10 pain Goal status: INITIAL    LONG TERM GOALS: Target date: 10/26/2023  Patient will demonstrate independence in advanced HEP. Baseline:  Goal status: INITIAL  2.  Patient will be able to walk 3 miles with < or = to 4/10 Lt hip/ buttock pain. Baseline: 1.5 miles; 7/10 pain Goal status: INITIAL   3.  Patient's FOTO score will increase to 69 for improved function. Baseline: 58 Goal status: INITIAL  4.  Patient will report waking up < or = to 2x/night with hip pain.  Baseline: frequently Goal status: INITIAL   PLAN:  PT FREQUENCY: 2x/week  PT DURATION: 8 weeks  PLANNED INTERVENTIONS: Therapeutic exercises, Therapeutic activity, Neuromuscular re-education, Balance training, Gait training, Patient/Family education, Self Care, Joint mobilization, Joint manipulation, Stair training, Vestibular training, Canalith repositioning, Aquatic Therapy, Dry Needling, Electrical stimulation, Spinal manipulation, Spinal mobilization, Cryotherapy, Moist heat, Taping, Vasopneumatic device, Traction, Ultrasound, Ionotophoresis 4mg /ml Dexamethasone, and Manual therapy.  PLAN FOR NEXT SESSION: single leg balance on unstable surface; lateral lunges  Claude Manges, PT 09/11/23 9:31 AM  [facility] [address] [place], [place] [address] Phone # [phone] Fax [phone]

## 2023-09-15 ENCOUNTER — Ambulatory Visit: Payer: Medicare Other | Admitting: Physical Therapy

## 2023-09-15 ENCOUNTER — Encounter: Payer: Self-pay | Admitting: Physical Therapy

## 2023-09-15 DIAGNOSIS — R2689 Other abnormalities of gait and mobility: Secondary | ICD-10-CM

## 2023-09-15 DIAGNOSIS — M6281 Muscle weakness (generalized): Secondary | ICD-10-CM

## 2023-09-15 DIAGNOSIS — M62838 Other muscle spasm: Secondary | ICD-10-CM

## 2023-09-15 DIAGNOSIS — M25552 Pain in left hip: Secondary | ICD-10-CM

## 2023-09-15 NOTE — Therapy (Signed)
OUTPATIENT PHYSICAL THERAPY LOWER EXTREMITY TREATMENT NOTE   Patient Name: Willie Burton MRN: [id number] DOB:1946-02-10, 77 y.o., male Today's Date: 09/15/2023  END OF SESSION:  PT End of Session - 09/15/23 1017     Visit Number 5    Date for PT Re-Evaluation 10/26/23    Authorization Type Medicare/ [employer]    Authorization Time Period Kx modifier after 15th visit    PT Start Time 0930    PT Stop Time 1013    PT Time Calculation (min) 43 min    Activity Tolerance Patient tolerated treatment well    Behavior During Therapy WFL for tasks assessed/performed                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   Arthritis    Coronary artery disease    Diverticula of colon    Dysrhythmia    PACs, PVCs   Heart disease    High blood pressure    High cholesterol    Hyperlipemia    Peripheral vascular disease (HCC)    Prostate cancer (HCC)    Past Surgical History:  Procedure Laterality Date   ABDOMINAL AORTOGRAM W/LOWER EXTREMITY N/A 10/16/2017   Procedure: ABDOMINAL AORTOGRAM W/LOWER EXTREMITY;  Surgeon: Sherren Kerns, MD;  Location: [facility];  Service: Cardiovascular;  Laterality: N/A;   APPENDECTOMY     CARDIAC CATHETERIZATION N/A 11/13/2015   Procedure: Left Heart Cath and Coronary Angiography;  Surgeon: Dalia Heading, MD;  Location: [facility];  Service: Cardiovascular;  Laterality: N/A;   CATARACT EXTRACTION     COLON SURGERY  2008   right colon resection   COLONOSCOPY WITH PROPOFOL N/A 01/06/2018   Procedure: COLONOSCOPY WITH PROPOFOL;  Surgeon: Toledo, Boykin Nearing, MD;  Location: [facility] END[facility];  Service: Gastroenterology;  Laterality: N/A;   ENDARTERECTOMY POPLITEAL Right 11/11/2017   FEMORAL-POPLITEAL BYPASS GRAFT Right 11/11/2017   Procedure: RIGHT POPLITEAL ENDARTERECTOMY;  Surgeon: Sherren Kerns, MD;  Location: [facility] OR;  Service: Vascular;  Laterality: Right;   LIPOMA  EXCISION Right    thigh   METACARPOPHALANGEAL JOINT ARTHROPLASTY     PATCH ANGIOPLASTY Right 11/11/2017   Procedure: PATCH ANGIOPLASTY of Superficial Fenoral Artery;  Surgeon: Sherren Kerns, MD;  Location: [facility] OR;  Service: Vascular;  Laterality: Right;   PROSTATECTOMY  2003   ULNAR NERVE TRANSPOSITION Left January 2015   Patient Active Problem List   Diagnosis Date Noted   Primary hypertension 10/17/2021   PAD (peripheral artery disease) (HCC) 10/09/2021   Hyperlipidemia LDL goal <70 10/08/2021   Other microscopic hematuria 10/08/2021   B12 deficiency 06/07/2021   Arthritis of right hand 04/11/2021   Achilles tendonitis, bilateral 03/16/2020   Bilateral Achilles tendonosis 05/16/2019   Claudication of calf muscles (HCC) 08/11/2017   Chronic bilateral low back pain with right-sided sciatica 08/11/2017   Carotid stenosis 05/05/2017   Presbycusis of both ears 03/20/2017   Pulsatile tinnitus of both ears 03/20/2017   H/O coronary artery bypass surgery 11/20/2015   Atherosclerotic heart disease of native coronary artery without angina pectoris 11/15/2015   HNP (herniated nucleus pulposus), lumbar 11/15/2014   Lumbar radiculitis 11/15/2014   Ulnar neuropathy of left upper extremity 10/13/2014   AC (acromioclavicular) arthritis 04/04/2013   CMC arthritis, thumb, degenerative 06/14/2012    PCP: Melida Quitter, MD  REFERRING PROVIDER: Tarry Kos, MD  REFERRING DIAG: [id number] (ICD-10-CM) -  Facet joint disease  Rationale for Evaluation and Treatment: Rehabilitation  THERAPY DIAG:  Pain in left hip  Muscle weakness (generalized)  Other muscle spasm  Other abnormalities of gait and mobility  ONSET DATE: 03/2023  SUBJECTIVE:                                                                                                                                                                                           SUBJECTIVE STATEMENT: Patient reports she is doing good today. On  Friday he was able to walk a mile before having any hip pain. On Sunday he went for a walk and started to experience hip pain after .25 miles. Currently his pain is 2/10 pain.   Eval: Patient reports Lt sided SIJ pain that began 03/2023. He went to [facility] and they did Xrays which were unremarkable. He went to PT for hip strengthening but it did not solve his pain he was having. He began having Lt hip pain as well as the buttock pain after completing around of PT. He normally walks 2.5-3 miles/ day 7 days a week but with the pain he was only able to tolerate 1.5 miles. He was then referred for an MRI and that is when he was diagnoses with a Lt gluteal minimus tear. The referring provider told him not to worry much about the tear and it is normal for his age. He reports always having a chronic limp but it has recently gotten worse. Recently he has not been walking for exercise, but he has been traveling outside of the country. Pt denies leg numbness & tingling. Functional activity limitations: walking >.25 miles, sleeping   PERTINENT HISTORY:  90% Lt Gluteus Minimus Tear, HTN, OA, Prostate Cancer, Prostatectomy 2003, Hx alcohol abuse (20 yrs sober)  From Doctor's Note:Recently had an MRI at [facility] that showed 90% left gluteus minimus tear. He states that the pain is 3 out of 10 at most. This causes him to walk with a Trendelenburg gait but he is also not interested in surgery. His main complaint is left-sided low back pain.  PAIN:  Are you having pain? Yes: NPRS scale: 4 (currently) 7(worst)/10 Pain location: Lt SIJ and Lt glute area Pain description: achy Aggravating factors: Walking >.25 miles; sleeping Relieving factors: Tylenol (helps a little bit)  PRECAUTIONS: None  RED FLAGS: None   WEIGHT BEARING RESTRICTIONS: No  FALLS:  Has patient fallen in last 6 months? No  LIVING ENVIRONMENT: Lives with: lives with their family and lives with their spouse Lives in:  House/apartment Stairs: No Has following equipment at home: None  OCCUPATION: Retired  PLOF:Independent Leisure:  Traveling   PATIENT GOALS: Get rid of this pain so I can walk and keep active  NEXT MD VISIT: PRN  OBJECTIVE:   DIAGNOSTIC FINDINGS:  Recently had an MRI at [facility] that showed 90% left gluteus minimus tear  PATIENT SURVEYS:  FOTO 58; goal: 51  SCREENING FOR RED FLAGS: Bowel or bladder incontinence: No Spinal tumors: No Cauda equina syndrome: No Compression fracture: No Abdominal aneurysm: No  COGNITION: Overall cognitive status: Within functional limits for tasks assessed      POSTURE: rounded shoulders and posterior pelvic tilt  PALPATION:  PA to SIJ reproduced pain Tenderness & taut bands to Lt gluteal complex    LOWER EXTREMITY ROM:     Passive  Right eval Left eval  Hip flexion [facility]  Hip extension [facility]  Hip abduction [facility] WFL  Hip adduction [facility]  Hip internal rotation    Hip external rotation    Knee flexion [facility] WFL  Knee extension [facility] WFL  Ankle dorsiflexion    Ankle plantarflexion    Ankle inversion    Ankle eversion     (Blank rows = not tested)  LOWER EXTREMITY MMT:    MMT Right eval Left eval  Hip flexion 5 5  Hip extension 4 4  Hip abduction 4 4  Hip adduction 4- 4-  Hip internal rotation    Hip external rotation    Knee flexion 5 5  Knee extension 5 5  Ankle dorsiflexion    Ankle plantarflexion    Ankle inversion    Ankle eversion     (Blank rows = not tested)  LUMBAR SPECIAL TESTS:  SI Compression/distraction test: Negative  FUNCTIONAL TESTS:  5 times sit to stand: 10.35 6 minute walk test: 1349ft  GAIT: Distance walked: 1325 Assistive device utilized: None Level of assistance: Complete Independence Comments: Antalgic gait; increased Lt gluteal min pain after 1.30 min that got worse as we continued to walk  TODAY'S TREATMENT:                                                                                                                               DATE:  09/15/2023 :  Nu-Step L5 7 min while discussing status Hip Hinging with leg against wall unilateral UE support on chair 2 x 10 2 inch Hip Hike 2x8 on Rt  Leg Press bilateral 110 lbs unilateral 55 lbs 2 x 10 each Y Balance (2 cones in front one behind) 2 x 10 each min UE support on barre Standing Figure Four 2 x 30 sec each Side Step Ups 6 inch step 2 x 10 Single Leg Stance 2 x 30 sec each - harder standing on Rt  STS with 5 lb KB 2 x 8 Manual: Attaday Lt gluteal muscles   09/11/2023 :  Nu-Step L4 7 min while discussing status Lateral Walks & Monster Walks yellow loop x 4  2 inch Hip Hike 2x8 on Rt  Leg Press bilateral 110 lbs unilateral 55 lbs 2 x 10 each Step Ups 6 inch unilateral 5# KB hold 2 x 10 each leg  STS with 5 lb DB 2 x 8 Y Balance (2 cones in front one behind) 2 x 10 each min UE support on barre Sitting piriformis stretch 2 x 30 sec Rt  Standing Quad stretch on chair 2 x 30 sec bilateral  Manual: Attaday Lt gluteal muscles    09/09/2023 :  Nu-Step L4 8 min while discussing status Reviewed updated HEP- performed all exercises correctly and required minimal verbal cues Pigeon pose stretch 30 sec each side Seated piriformis stretch 30 sec side Hand supported lunge 2x10 each leg Leg Press 2 inch Hip Hike 2x8 on Rt  Step Ups 6 inch + knee drive x 10 each leg  Manual: Attaday Lt gluteal muscles    PATIENT EDUCATION:  Education details: Hip weakness; hip strengthening exercises; POC Person educated: Patient Education method: Explanation, Demonstration, and Handouts Education comprehension: verbalized understanding, returned demonstration, and needs further education  HOME EXERCISE PROGRAM: Access Code: [email or web address] URL: [email or web address] Date: 09/09/2023 Prepared by: Claude Manges  Exercises - Standing Hip Abduction with Counter Support  - 1 x daily - 7 x weekly - 2 sets - 10 reps -  Standing Hip Extension with Counter Support  - 1 x daily - 7 x weekly - 2 sets - 10 reps - Standing Hip Extension with Leg Bent and Support  - 1 x daily - 7 x weekly - 2 sets - 10 reps - Clamshell with Resistance  - 1 x daily - 7 x weekly - 1 sets - 10 reps - Seated Piriformis Stretch with Trunk Bend  - 1 x daily - 7 x weekly - 2 sets - 3 reps - 30 hold - Quadruped Fire Hydrant  - 1 x daily - 7 x weekly - 1 sets - 10 reps - Standing Hip ER Stretch at Table  - 1 x daily - 7 x weekly - 1 sets - 10 reps - 30 hold   ASSESSMENT:  CLINICAL IMPRESSION: Today's treatment session focused on hip strengthening. Incorporated single leg hinging against wall and patient experienced a deep ache in hip Rt leg when standing on it. Noted improved performance with hip hike exercises; patient did not require as much UE support. Noted smoother performance of standing Y balance exercise. At times patient was able to balance without UE support. Patient will benefit from skilled PT to address the below impairments and improve overall function.   OBJECTIVE IMPAIRMENTS: Abnormal gait, difficulty walking, decreased strength, increased muscle spasms, and pain.   ACTIVITY LIMITATIONS: sleeping and walking >.25 miles  PARTICIPATION LIMITATIONS: community activity and walking tolerance while traveling  PERSONAL FACTORS: Age and 3+ comorbidities: HTN, OA, Hx of cancer  are also affecting patient's functional outcome.   REHAB POTENTIAL: Good  CLINICAL DECISION MAKING: Stable/uncomplicated  EVALUATION COMPLEXITY: Low   GOALS: Goals reviewed with patient? Yes  SHORT TERM GOALS: Target date: 09/28/2023   Patient will be independent with initial HEP. Baseline:  Goal status: INITIAL  2.  Patient will be able to walk 1.75 miles with < or = to 5/10 Lt hip/ buttock pain. Baseline: 1.5 miles; 7/10 pain Goal status: INITIAL    LONG TERM GOALS: Target date: 10/26/2023  Patient will demonstrate independence in  advanced HEP. Baseline:  Goal status: INITIAL  2.  Patient will be able to walk 3 miles with < or = to 4/10  Lt hip/ buttock pain. Baseline: 1.5 miles; 7/10 pain Goal status: INITIAL   3.  Patient's FOTO score will increase to 69 for improved function. Baseline: 58 Goal status: INITIAL  4.  Patient will report waking up < or = to 2x/night with hip pain.  Baseline: frequently Goal status: INITIAL   PLAN:  PT FREQUENCY: 2x/week  PT DURATION: 8 weeks  PLANNED INTERVENTIONS: Therapeutic exercises, Therapeutic activity, Neuromuscular re-education, Balance training, Gait training, Patient/Family education, Self Care, Joint mobilization, Joint manipulation, Stair training, Vestibular training, Canalith repositioning, Aquatic Therapy, Dry Needling, Electrical stimulation, Spinal manipulation, Spinal mobilization, Cryotherapy, Moist heat, Taping, Vasopneumatic device, Traction, Ultrasound, Ionotophoresis 4mg /ml Dexamethasone, and Manual therapy.  PLAN FOR NEXT SESSION: single leg balance on unstable surface; lateral lunges  Claude Manges, PT 09/15/23 10:18 AM  [facility] [address], [address] [place], [place] [address] Phone # [phone] Fax [phone]

## 2023-09-17 ENCOUNTER — Ambulatory Visit: Payer: Medicare Other | Admitting: Physical Therapy

## 2023-09-17 ENCOUNTER — Encounter: Payer: Self-pay | Admitting: Physical Therapy

## 2023-09-17 DIAGNOSIS — M25552 Pain in left hip: Secondary | ICD-10-CM

## 2023-09-17 DIAGNOSIS — M6281 Muscle weakness (generalized): Secondary | ICD-10-CM

## 2023-09-17 DIAGNOSIS — R2689 Other abnormalities of gait and mobility: Secondary | ICD-10-CM

## 2023-09-17 DIAGNOSIS — M62838 Other muscle spasm: Secondary | ICD-10-CM

## 2023-09-17 NOTE — Therapy (Signed)
OUTPATIENT PHYSICAL THERAPY LOWER EXTREMITY TREATMENT NOTE   Patient Name: Willie Burton MRN: [id number] DOB:08/16/1946, 77 y.o., male Today's Date: 09/17/2023  END OF SESSION:  PT End of Session - 09/17/23 0930     Visit Number 6    Date for PT Re-Evaluation 10/26/23    Authorization Type Medicare/ [employer] Blue [employer]    Authorization Time Period Kx modifier after 15th visit    Progress Note Due on Visit 10    PT Start Time 0845    PT Stop Time 0929    PT Time Calculation (min) 44 min    Activity Tolerance Patient tolerated treatment well    Behavior During Therapy [facility] for tasks assessed/performed                 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   Arthritis    Coronary artery disease    Diverticula of colon    Dysrhythmia    PACs, PVCs   Heart disease    High blood pressure    High cholesterol    Hyperlipemia    Peripheral vascular disease (HCC)    Prostate cancer (HCC)    Past Surgical History:  Procedure Laterality Date   ABDOMINAL AORTOGRAM W/LOWER EXTREMITY N/A 10/16/2017   Procedure: ABDOMINAL AORTOGRAM W/LOWER EXTREMITY;  Surgeon: Sherren Kerns, MD;  Location: [facility] IN[facility]AS[facility];  Service: Cardiovascular;  Laterality: N/A;   APPENDECTOMY     CARDIAC CATHETERIZATION N/A 11/13/2015   Procedure: Left Heart Cath and Coronary Angiography;  Surgeon: Dalia Heading, MD;  Location: [facility] INVASIVE CV LAB;  Service: Cardiovascular;  Laterality: N/A;   CATARACT EXTRACTION     COLON SURGERY  2008   right colon resection   COLONOSCOPY WITH PROPOFOL N/A 01/06/2018   Procedure: COLONOSCOPY WITH PROPOFOL;  Surgeon: Toledo, Boykin Nearing, MD;  Location: [facility] ENDOSCOPY;  Service: Gastroenterology;  Laterality: N/A;   ENDARTERECTOMY POPLITEAL Right 11/11/2017   FEMORAL-POPLITEAL BYPASS GRAFT Right 11/11/2017   Procedure: RIGHT POPLITEAL ENDARTERECTOMY;  Surgeon: Sherren Kerns, MD;  Location: [facility] OR;  Service:  Vascular;  Laterality: Right;   LIPOMA EXCISION Right    thigh   METACARPOPHALANGEAL JOINT ARTHROPLASTY     PATCH ANGIOPLASTY Right 11/11/2017   Procedure: PATCH ANGIOPLASTY of Superficial Fenoral Artery;  Surgeon: Sherren Kerns, MD;  Location: [facility] OR;  Service: Vascular;  Laterality: Right;   PROSTATECTOMY  2003   ULNAR NERVE TRANSPOSITION Left January 2015   Patient Active Problem List   Diagnosis Date Noted   Primary hypertension 10/17/2021   PAD (peripheral artery disease) (HCC) 10/09/2021   Hyperlipidemia LDL goal <70 10/08/2021   Other microscopic hematuria 10/08/2021   B12 deficiency 06/07/2021   Arthritis of right hand 04/11/2021   Achilles tendonitis, bilateral 03/16/2020   Bilateral Achilles tendonosis 05/16/2019   Claudication of calf muscles (HCC) 08/11/2017   Chronic bilateral low back pain with right-sided sciatica 08/11/2017   Carotid stenosis 05/05/2017   Presbycusis of both ears 03/20/2017   Pulsatile tinnitus of both ears 03/20/2017   H/O coronary artery bypass surgery 11/20/2015   Atherosclerotic heart disease of native coronary artery without angina pectoris 11/15/2015   HNP (herniated nucleus pulposus), lumbar 11/15/2014   Lumbar radiculitis 11/15/2014   Ulnar neuropathy of left upper extremity 10/13/2014   AC (acromioclavicular) arthritis 04/04/2013   CMC arthritis, thumb, degenerative 06/14/2012    PCP: Melida Quitter, MD  REFERRING PROVIDER:  Tarry Kos, MD  REFERRING DIAG: [phone] (ICD-10-CM) - Facet joint disease  Rationale for Evaluation and Treatment: Rehabilitation  THERAPY DIAG:  Pain in left hip  Muscle weakness (generalized)  Other muscle spasm  Other abnormalities of gait and mobility  ONSET DATE: 03/2023  SUBJECTIVE:                                                                                                                                                                                           SUBJECTIVE  STATEMENT: Patient reports he is doing okay today. He tried to go on a walk yesterday and his hip started to hurt after 1/4th of a mile. He started to limp and his hip got a little achy. Currently his pain is 2/10 pain.   Eval: Patient reports Lt sided SIJ pain that began 03/2023. He went to [facility] and they did Xrays which were unremarkable. He went to PT for hip strengthening but it did not solve his pain he was having. He began having Lt hip pain as well as the buttock pain after completing around of PT. He normally walks 2.5-3 miles/ day 7 days a week but with the pain he was only able to tolerate 1.5 miles. He was then referred for an MRI and that is when he was diagnoses with a Lt gluteal minimus tear. The referring provider told him not to worry much about the tear and it is normal for his age. He reports always having a chronic limp but it has recently gotten worse. Recently he has not been walking for exercise, but he has been traveling outside of the country. Pt denies leg numbness & tingling. Functional activity limitations: walking >.25 miles, sleeping   PERTINENT HISTORY:  90% Lt Gluteus Minimus Tear, HTN, OA, Prostate Cancer, Prostatectomy 2003, Hx alcohol abuse (20 yrs sober)  From Doctor's Note:Recently had an MRI at [facility] that showed 90% left gluteus minimus tear. He states that the pain is 3 out of 10 at most. This causes him to walk with a Trendelenburg gait but he is also not interested in surgery. His main complaint is left-sided low back pain.   PAIN: 09/17/2023 Are you having pain? Yes: NPRS scale: 01/10/09 Pain location: Lt SIJ and Lt glute area Pain description: achy Aggravating factors: Walking >.25 miles; sleeping Relieving factors: Tylenol (helps a little bit)  PRECAUTIONS: None  RED FLAGS: None   WEIGHT BEARING RESTRICTIONS: No  FALLS:  Has patient fallen in last 6 months? No  LIVING ENVIRONMENT: Lives with: lives with their family and lives with  their spouse Lives in: House/apartment Stairs: No Has following equipment  at home: None  OCCUPATION: Retired  PLOF:Independent Leisure: Traveling   PATIENT GOALS: Get rid of this pain so I can walk and keep active  NEXT MD VISIT: PRN  OBJECTIVE:   DIAGNOSTIC FINDINGS:  Recently had an MRI at [facility] that showed 90% left gluteus minimus tear  PATIENT SURVEYS:  FOTO 58; goal: 62  SCREENING FOR RED FLAGS: Bowel or bladder incontinence: No Spinal tumors: No Cauda equina syndrome: No Compression fracture: No Abdominal aneurysm: No  COGNITION: Overall cognitive status: Within functional limits for tasks assessed      POSTURE: rounded shoulders and posterior pelvic tilt  PALPATION:  PA to SIJ reproduced pain Tenderness & taut bands to Lt gluteal complex    LOWER EXTREMITY ROM:     Passive  Right eval Left eval  Hip flexion [facility]  Hip extension [facility]  Hip abduction [facility] WFL  Hip adduction [facility]  Hip internal rotation    Hip external rotation    Knee flexion [facility] WFL  Knee extension [facility] WFL  Ankle dorsiflexion    Ankle plantarflexion    Ankle inversion    Ankle eversion     (Blank rows = not tested)  LOWER EXTREMITY MMT:    MMT Right eval Left eval  Hip flexion 5 5  Hip extension 4 4  Hip abduction 4 4  Hip adduction 4- 4-  Hip internal rotation    Hip external rotation    Knee flexion 5 5  Knee extension 5 5  Ankle dorsiflexion    Ankle plantarflexion    Ankle inversion    Ankle eversion     (Blank rows = not tested)  LUMBAR SPECIAL TESTS:  SI Compression/distraction test: Negative  FUNCTIONAL TESTS:  5 times sit to stand: 10.35 6 minute walk test: 132ft ; pain after 2 minutes  09/17/2023: 6 MWT: 1488ft; notable antalgic gait at 2.5 minutes; pain started at 4.5 minutes; pain increased a 6 minute mark  GAIT: Distance walked: 1325 Assistive device utilized: None Level of assistance: Complete Independence Comments: Antalgic  gait; increased Lt gluteal min pain after 1.30 min that got worse as we continued to walk  TODAY'S TREATMENT:                                                                                                                              DATE:  09/17/2023 :  Nu-Step L5 7 min while discussing status 2 inch Hip Hike 2x8 on Rt  Leg Press bilateral 110 lbs unilateral 60 lbs 2 x 10 each Single leg balance on airex taping cones x 10 each unilateral UE support on bar Lateral Lunge x 10 next to barre for support if needed 6 MWT: 1436ft; notable antalgic gait at 2.5 minutes; pain started at 4.5 minutes; pain increased a 6 minute mark Seated piriformis stretch 2 x 30 sec STS with 5 lb KB 2 x 8  Step Ups 6 inch step 2  x 10 ; unilateral 5 # KB hold Side Step Ups 6 inch x 10 Hip flexor stretch on second stair 2 x 30 sec Stand hamstring stretch on second second 2 x 30 sec Education on variety of hamstring stretches to perform at home   09/15/2023 :  Nu-Step L5 7 min while discussing status Hip Hinging with leg against wall unilateral UE support on chair 2 x 10 2 inch Hip Hike 2x8 on Rt  Leg Press bilateral 110 lbs unilateral 55 lbs 2 x 10 each Y Balance (2 cones in front one behind) 2 x 10 each min UE support on barre Standing Figure Four 2 x 30 sec each Side Step Ups 6 inch step 2 x 10 Single Leg Stance 2 x 30 sec each - harder standing on Rt  STS with 5 lb KB 2 x 8 Manual: Attaday Lt gluteal muscles   09/11/2023 :  Nu-Step L4 7 min while discussing status Lateral Walks & Monster Walks yellow loop x 4  2 inch Hip Hike 2x8 on Rt  Leg Press bilateral 110 lbs unilateral 55 lbs 2 x 10 each Step Ups 6 inch unilateral 5# KB hold 2 x 10 each leg  STS with 5 lb DB 2 x 8 Y Balance (2 cones in front one behind) 2 x 10 each min UE support on barre Sitting piriformis stretch 2 x 30 sec Rt  Standing Quad stretch on chair 2 x 30 sec bilateral  Manual: Attaday Lt gluteal muscles      PATIENT  EDUCATION:  Education details: Hip weakness; hip strengthening exercises; POC Person educated: Patient Education method: Explanation, Demonstration, and Handouts Education comprehension: verbalized understanding, returned demonstration, and needs further education  HOME EXERCISE PROGRAM: Access Code: [email or web address] URL: [email or web address] Date: 09/09/2023 Prepared by: Claude Manges  Exercises - Standing Hip Abduction with Counter Support  - 1 x daily - 7 x weekly - 2 sets - 10 reps - Standing Hip Extension with Counter Support  - 1 x daily - 7 x weekly - 2 sets - 10 reps - Standing Hip Extension with Leg Bent and Support  - 1 x daily - 7 x weekly - 2 sets - 10 reps - Clamshell with Resistance  - 1 x daily - 7 x weekly - 1 sets - 10 reps - Seated Piriformis Stretch with Trunk Bend  - 1 x daily - 7 x weekly - 2 sets - 3 reps - 30 hold - Quadruped Fire Hydrant  - 1 x daily - 7 x weekly - 1 sets - 10 reps - Standing Hip ER Stretch at Table  - 1 x daily - 7 x weekly - 1 sets - 10 reps - 30 hold   ASSESSMENT:  CLINICAL IMPRESSION: Today's treatment session focused on hip strengthening and single leg balance. Patient verbalized he feels 10% better since starting therapy. He feels stronger but he is still having his normal hip pain when walking for exercise. Re administered six minute walk test today and patient walked 168ft more than previous attempt, and he experienced Rt hip pain after 4 1/2 minutes of walking. At the end of the test he had increase hip pain but it was alleviated with stretching and resting for 2 minutes. Overall, patient tolerated treatment session well. Educated patient on a variety of hamstring stretches he can perform at home. Patient will benefit from skilled PT to address the below impairments and improve overall function.    OBJECTIVE IMPAIRMENTS: Abnormal gait, difficulty  walking, decreased strength, increased muscle spasms, and pain.   ACTIVITY  LIMITATIONS: sleeping and walking >.25 miles  PARTICIPATION LIMITATIONS: community activity and walking tolerance while traveling  PERSONAL FACTORS: Age and 3+ comorbidities: HTN, OA, Hx of cancer  are also affecting patient's functional outcome.   REHAB POTENTIAL: Good  CLINICAL DECISION MAKING: Stable/uncomplicated  EVALUATION COMPLEXITY: Low   GOALS: Goals reviewed with patient? Yes  SHORT TERM GOALS: Target date: 09/28/2023   Patient will be independent with initial HEP. Baseline:  Goal status: INITIAL  2.  Patient will be able to walk 1.75 miles with < or = to 5/10 Lt hip/ buttock pain. Baseline: 1.5 miles; 7/10 pain Goal status: INITIAL    LONG TERM GOALS: Target date: 10/26/2023  Patient will demonstrate independence in advanced HEP. Baseline:  Goal status: INITIAL  2.  Patient will be able to walk 3 miles with < or = to 4/10 Lt hip/ buttock pain. Baseline: 1.5 miles; 7/10 pain Goal status: INITIAL   3.  Patient's FOTO score will increase to 69 for improved function. Baseline: 58 Goal status: INITIAL  4.  Patient will report waking up < or = to 2x/night with hip pain.  Baseline: frequently Goal status: INITIAL   PLAN:  PT FREQUENCY: 2x/week  PT DURATION: 8 weeks  PLANNED INTERVENTIONS: Therapeutic exercises, Therapeutic activity, Neuromuscular re-education, Balance training, Gait training, Patient/Family education, Self Care, Joint mobilization, Joint manipulation, Stair training, Vestibular training, Canalith repositioning, Aquatic Therapy, Dry Needling, Electrical stimulation, Spinal manipulation, Spinal mobilization, Cryotherapy, Moist heat, Taping, Vasopneumatic device, Traction, Ultrasound, Ionotophoresis 4mg /ml Dexamethasone, and Manual therapy.  PLAN FOR NEXT SESSION: continue progressing single leg balance; warm up on treadmill next time  Claude Manges, PT 09/17/23 9:31 AM  [facility] [address] [place], [place] [address] Phone # [phone] Fax [phone]

## 2023-09-21 ENCOUNTER — Ambulatory Visit: Payer: Medicare Other | Admitting: Physical Therapy

## 2023-09-21 ENCOUNTER — Encounter: Payer: Self-pay | Admitting: Physical Therapy

## 2023-09-21 DIAGNOSIS — M62838 Other muscle spasm: Secondary | ICD-10-CM

## 2023-09-21 DIAGNOSIS — M6281 Muscle weakness (generalized): Secondary | ICD-10-CM

## 2023-09-21 DIAGNOSIS — M25552 Pain in left hip: Secondary | ICD-10-CM | POA: Diagnosis not present

## 2023-09-21 DIAGNOSIS — R2689 Other abnormalities of gait and mobility: Secondary | ICD-10-CM

## 2023-09-21 NOTE — Therapy (Signed)
OUTPATIENT PHYSICAL THERAPY LOWER EXTREMITY TREATMENT NOTE   Patient Name: Willie Burton MRN: [id number] DOB:Dec 23, 1945, 77 y.o., male Today's Date: 09/21/2023  END OF SESSION:  PT End of Session - 09/21/23 1014     Visit Number 7    Date for PT Re-Evaluation 10/26/23    Authorization Type Medicare/ [employer] Blue [employer]    Authorization Time Period Kx modifier after 15th visit    Progress Note Due on Visit 10    PT Start Time 0932    PT Stop Time 1011    PT Time Calculation (min) 39 min    Activity Tolerance Patient tolerated treatment well    Behavior During Therapy WFL for tasks assessed/performed                  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   Arthritis    Coronary artery disease    Diverticula of colon    Dysrhythmia    PACs, PVCs   Heart disease    High blood pressure    High cholesterol    Hyperlipemia    Peripheral vascular disease (HCC)    Prostate cancer (HCC)    Past Surgical History:  Procedure Laterality Date   ABDOMINAL AORTOGRAM W/LOWER EXTREMITY N/A 10/16/2017   Procedure: ABDOMINAL AORTOGRAM W/LOWER EXTREMITY;  Surgeon: Sherren Kerns, MD;  Location: [facility];  Service: Cardiovascular;  Laterality: N/A;   APPENDECTOMY     CARDIAC CATHETERIZATION N/A 11/13/2015   Procedure: Left Heart Cath and Coronary Angiography;  Surgeon: Dalia Heading, MD;  Location: [facility] INVASIVE CV [facility];  Service: Cardiovascular;  Laterality: N/A;   CATARACT EXTRACTION     COLON SURGERY  2008   right colon resection   COLONOSCOPY WITH PROPOFOL N/A 01/06/2018   Procedure: COLONOSCOPY WITH PROPOFOL;  Surgeon: Toledo, Boykin Nearing, MD;  Location: [facility] ENDOSCOPY;  Service: Gastroenterology;  Laterality: N/A;   ENDARTERECTOMY POPLITEAL Right 11/11/2017   FEMORAL-POPLITEAL BYPASS GRAFT Right 11/11/2017   Procedure: RIGHT POPLITEAL ENDARTERECTOMY;  Surgeon: Sherren Kerns, MD;  Location: [facility] OR;  Service:  Vascular;  Laterality: Right;   LIPOMA EXCISION Right    thigh   METACARPOPHALANGEAL JOINT ARTHROPLASTY     PATCH ANGIOPLASTY Right 11/11/2017   Procedure: PATCH ANGIOPLASTY of Superficial Fenoral Artery;  Surgeon: Sherren Kerns, MD;  Location: [facility] OR;  Service: Vascular;  Laterality: Right;   PROSTATECTOMY  2003   ULNAR NERVE TRANSPOSITION Left January 2015   Patient Active Problem List   Diagnosis Date Noted   Primary hypertension 10/17/2021   PAD (peripheral artery disease) (HCC) 10/09/2021   Hyperlipidemia LDL goal <70 10/08/2021   Other microscopic hematuria 10/08/2021   B12 deficiency 06/07/2021   Arthritis of right hand 04/11/2021   Achilles tendonitis, bilateral 03/16/2020   Bilateral Achilles tendonosis 05/16/2019   Claudication of calf muscles (HCC) 08/11/2017   Chronic bilateral low back pain with right-sided sciatica 08/11/2017   Carotid stenosis 05/05/2017   Presbycusis of both ears 03/20/2017   Pulsatile tinnitus of both ears 03/20/2017   H/O coronary artery bypass surgery 11/20/2015   Atherosclerotic heart disease of native coronary artery without angina pectoris 11/15/2015   HNP (herniated nucleus pulposus), lumbar 11/15/2014   Lumbar radiculitis 11/15/2014   Ulnar neuropathy of left upper extremity 10/13/2014   AC (acromioclavicular) arthritis 04/04/2013   [facility] arthritis, thumb, degenerative 06/14/2012    PCP: Melida Quitter, MD  REFERRING  PROVIDER: Tarry Kos, MD  REFERRING DIAG: [phone] (ICD-10-CM) - Facet joint disease  Rationale for Evaluation and Treatment: Rehabilitation  THERAPY DIAG:  Pain in left hip  Muscle weakness (generalized)  Other muscle spasm  Other abnormalities of gait and mobility  ONSET DATE: 03/2023  SUBJECTIVE:                                                                                                                                                                                           SUBJECTIVE  STATEMENT: Patient reports he is doing good today. He is not currently having pain. On Friday patient walked 1.3 mile and he felt good. On Saturday he went for a  walk and was only able to tolerate walking 1/4 mile before experiencing increased hip pain and he had to turn around to go back home.   Eval: Patient reports Lt sided SIJ pain that began 03/2023. He went to [facility] and they did Xrays which were unremarkable. He went to PT for hip strengthening but it did not solve his pain he was having. He began having Lt hip pain as well as the buttock pain after completing around of PT. He normally walks 2.5-3 miles/ day 7 days a week but with the pain he was only able to tolerate 1.5 miles. He was then referred for an MRI and that is when he was diagnoses with a Lt gluteal minimus tear. The referring provider told him not to worry much about the tear and it is normal for his age. He reports always having a chronic limp but it has recently gotten worse. Recently he has not been walking for exercise, but he has been traveling outside of the country. Pt denies leg numbness & tingling. Functional activity limitations: walking >.25 miles, sleeping   PERTINENT HISTORY:  90% Lt Gluteus Minimus Tear, HTN, OA, Prostate Cancer, Prostatectomy 2003, Hx alcohol abuse (20 yrs sober)  From Doctor's Note:Recently had an MRI at [facility] that showed 90% left gluteus minimus tear. He states that the pain is 3 out of 10 at most. This causes him to walk with a Trendelenburg gait but he is also not interested in surgery. His main complaint is left-sided low back pain.   PAIN: 09/17/2023 Are you having pain? Yes: NPRS scale: 01/10/09 Pain location: Lt SIJ and Lt glute area Pain description: achy Aggravating factors: Walking >.25 miles; sleeping Relieving factors: Tylenol (helps a little bit)  PRECAUTIONS: None  RED FLAGS: None   WEIGHT BEARING RESTRICTIONS: No  FALLS:  Has patient fallen in last 6 months?  No  LIVING ENVIRONMENT: Lives with: lives with their family  and lives with their spouse Lives in: House/apartment Stairs: No Has following equipment at home: None  OCCUPATION: Retired  PLOF:Independent Leisure: Traveling   PATIENT GOALS: Get rid of this pain so I can walk and keep active  NEXT MD VISIT: PRN  OBJECTIVE:   DIAGNOSTIC FINDINGS:  Recently had an MRI at [facility] that showed 90% left gluteus minimus tear  PATIENT SURVEYS:  FOTO 58; goal: 69  SCREENING FOR RED FLAGS: Bowel or bladder incontinence: No Spinal tumors: No Cauda equina syndrome: No Compression fracture: No Abdominal aneurysm: No  COGNITION: Overall cognitive status: Within functional limits for tasks assessed      POSTURE: rounded shoulders and posterior pelvic tilt  PALPATION:  PA to SIJ reproduced pain Tenderness & taut bands to Lt gluteal complex    LOWER EXTREMITY ROM:     Passive  Right eval Left eval  Hip flexion [facility]  Hip extension [facility]  Hip abduction [facility] WFL  Hip adduction [facility]  Hip internal rotation    Hip external rotation    Knee flexion [facility] WFL  Knee extension [facility] WFL  Ankle dorsiflexion    Ankle plantarflexion    Ankle inversion    Ankle eversion     (Blank rows = not tested)  LOWER EXTREMITY MMT:    MMT Right eval Left eval  Hip flexion 5 5  Hip extension 4 4  Hip abduction 4 4  Hip adduction 4- 4-  Hip internal rotation    Hip external rotation    Knee flexion 5 5  Knee extension 5 5  Ankle dorsiflexion    Ankle plantarflexion    Ankle inversion    Ankle eversion     (Blank rows = not tested)  LUMBAR SPECIAL TESTS:  SI Compression/distraction test: Negative  FUNCTIONAL TESTS:  5 times sit to stand: 10.35 6 minute walk test: 134ft ; pain after 2 minutes  09/17/2023: 6 MWT: 1441ft; notable antalgic gait at 2.5 minutes; pain started at 4.5 minutes; pain increased a 6 minute mark  GAIT: Distance walked: 1325 Assistive device  utilized: None Level of assistance: Complete Independence Comments: Antalgic gait; increased Lt gluteal min pain after 1.30 min that got worse as we continued to walk  TODAY'S TREATMENT:                                                                                                                              DATE:  09/21/2023 :  Elliptical 5 mins- PT present to discuss status Created walking warm up: standing heel raises, standing marching, and side steps see updated HEP below 2 inch Hip Hike 2x8 on Rt  Step Ups 6 inch step 2 x 10 ; unilateral 5 # KB hold STS with 5 lb KB 2 x 10 Leg Press bilateral 120 lbs unilateral 65 lbs 2 x 10 each Single leg balance on airex taping cones (forward, lateral, back) x 10 each unilateral UE support  on bar Seated piriformis stretch 2 x 30 sec Side Step Ups 6 inch + knee drive 5# DB  2 x 10 Lateral Lunge x 10 next to barre for support if needed Supine hamstring stretch with green strap 2 x 30 sec  09/17/2023 :  Nu-Step L5 7 min while discussing status 2 inch Hip Hike 2x8 on Rt  Leg Press bilateral 110 lbs unilateral 60 lbs 2 x 10 each Single leg balance on airex taping cones x 10 each unilateral UE support on bar Lateral Lunge x 10 next to barre for support if needed 6 MWT: 147ft; notable antalgic gait at 2.5 minutes; pain started at 4.5 minutes; pain increased a 6 minute mark Seated piriformis stretch 2 x 30 sec STS with 5 lb KB 2 x 8  Step Ups 6 inch step 2 x 10 ; unilateral 5 # KB hold Side Step Ups 6 inch x 10 Hip flexor stretch on second stair 2 x 30 sec Stand hamstring stretch on second second 2 x 30 sec Education on variety of hamstring stretches to perform at home   09/15/2023 :  Nu-Step L5 7 min while discussing status Hip Hinging with leg against wall unilateral UE support on chair 2 x 10 2 inch Hip Hike 2x8 on Rt  Leg Press bilateral 110 lbs unilateral 55 lbs 2 x 10 each Y Balance (2 cones in front one behind) 2 x 10 each min UE  support on barre Standing Figure Four 2 x 30 sec each Side Step Ups 6 inch step 2 x 10 Single Leg Stance 2 x 30 sec each - harder standing on Rt  STS with 5 lb KB 2 x 8 Manual: Attaday Lt gluteal muscles     PATIENT EDUCATION:  Education details: Hip weakness; hip strengthening exercises; POC Person educated: Patient Education method: Explanation, Demonstration, and Handouts Education comprehension: verbalized understanding, returned demonstration, and needs further education  HOME EXERCISE PROGRAM: Access Code: [email or web address] URL: [email or web address] Date: 09/21/2023 Prepared by: Claude Manges  Exercises - Standing Hip Abduction with Counter Support  - 1 x daily - 7 x weekly - 2 sets - 10 reps - Standing Hip Extension with Counter Support  - 1 x daily - 7 x weekly - 2 sets - 10 reps - Standing Hip Extension with Leg Bent and Support  - 1 x daily - 7 x weekly - 2 sets - 10 reps - Clamshell with Resistance  - 1 x daily - 7 x weekly - 1 sets - 10 reps - Seated Piriformis Stretch with Trunk Bend  - 1 x daily - 7 x weekly - 2 sets - 3 reps - 30 hold - Quadruped Fire Hydrant  - 1 x daily - 7 x weekly - 1 sets - 10 reps - Standing Hip ER Stretch at Table  - 1 x daily - 7 x weekly - 1 sets - 10 reps - 30 hold - Side Stepping with Resistance at Ankles  - 1 x daily - 7 x weekly - 1 sets - 10 reps - Heel Raises with Counter Support  - 1 x daily - 7 x weekly - 3 sets - 10 reps - Marching with Resistance  - 1 x daily - 7 x weekly - 1 sets - 10 reps   ASSESSMENT:  CLINICAL IMPRESSION: Today's treatment session focused on hip strengthening. Patient continues to experience fluctuations in how many miles he is able to walk. Updated patient's HEP to include a warm up  for patient to perform before he goes on his walks. Patient required moderate verbal cues for correct performance of exercises. Patient tolerated increased weight on leg press. Single leg balance on an airex continues to be a  challenge for patient. Patient will benefit from skilled PT to address the below impairments and improve overall function.    OBJECTIVE IMPAIRMENTS: Abnormal gait, difficulty walking, decreased strength, increased muscle spasms, and pain.   ACTIVITY LIMITATIONS: sleeping and walking >.25 miles  PARTICIPATION LIMITATIONS: community activity and walking tolerance while traveling  PERSONAL FACTORS: Age and 3+ comorbidities: HTN, OA, Hx of cancer  are also affecting patient's functional outcome.   REHAB POTENTIAL: Good  CLINICAL DECISION MAKING: Stable/uncomplicated  EVALUATION COMPLEXITY: Low   GOALS: Goals reviewed with patient? Yes  SHORT TERM GOALS: Target date: 09/28/2023   Patient will be independent with initial HEP. Baseline:  Goal status: INITIAL  2.  Patient will be able to walk 1.75 miles with < or = to 5/10 Lt hip/ buttock pain. Baseline: 1.5 miles; 7/10 pain Goal status: INITIAL    LONG TERM GOALS: Target date: 10/26/2023  Patient will demonstrate independence in advanced HEP. Baseline:  Goal status: INITIAL  2.  Patient will be able to walk 3 miles with < or = to 4/10 Lt hip/ buttock pain. Baseline: 1.5 miles; 7/10 pain Goal status: INITIAL   3.  Patient's FOTO score will increase to 69 for improved function. Baseline: 58 Goal status: INITIAL  4.  Patient will report waking up < or = to 2x/night with hip pain.  Baseline: frequently Goal status: INITIAL   PLAN:  PT FREQUENCY: 2x/week  PT DURATION: 8 weeks  PLANNED INTERVENTIONS: Therapeutic exercises, Therapeutic activity, Neuromuscular re-education, Balance training, Gait training, Patient/Family education, Self Care, Joint mobilization, Joint manipulation, Stair training, Vestibular training, Canalith repositioning, Aquatic Therapy, Dry Needling, Electrical stimulation, Spinal manipulation, Spinal mobilization, Cryotherapy, Moist heat, Taping, Vasopneumatic device, Traction, Ultrasound,  Ionotophoresis 4mg /ml Dexamethasone, and Manual therapy.  PLAN FOR NEXT SESSION: reverse clamshells; bridging + abduction  Claude Manges, PT 09/21/23 10:15 AM  [facility] [address] [place], [place] [address] Phone # [phone] Fax [phone]

## 2023-09-23 ENCOUNTER — Encounter: Payer: Self-pay | Admitting: Physical Therapy

## 2023-09-23 ENCOUNTER — Ambulatory Visit: Payer: Medicare Other | Admitting: Physical Therapy

## 2023-09-23 DIAGNOSIS — M6281 Muscle weakness (generalized): Secondary | ICD-10-CM

## 2023-09-23 DIAGNOSIS — R2689 Other abnormalities of gait and mobility: Secondary | ICD-10-CM

## 2023-09-23 DIAGNOSIS — M25552 Pain in left hip: Secondary | ICD-10-CM | POA: Diagnosis not present

## 2023-09-23 DIAGNOSIS — M62838 Other muscle spasm: Secondary | ICD-10-CM

## 2023-09-23 NOTE — Therapy (Signed)
OUTPATIENT PHYSICAL THERAPY LOWER EXTREMITY TREATMENT NOTE   Patient Name: Willie Burton MRN: [id number] DOB:1946/01/12, 77 y.o., male Today's Date: 09/23/2023  END OF SESSION:  PT End of Session - 09/23/23 1023     Visit Number 8    Date for PT Re-Evaluation 10/26/23    Authorization Type Medicare/ [employer] Blue [employer]    Authorization Time Period Kx modifier after 15th visit    Progress Note Due on Visit 10    PT Start Time 0932    PT Stop Time 1013    PT Time Calculation (min) 41 min    Activity Tolerance Patient tolerated treatment well    Behavior During Therapy WFL for tasks assessed/performed                   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   Arthritis    Coronary artery disease    Diverticula of colon    Dysrhythmia    PACs, PVCs   Heart disease    High blood pressure    High cholesterol    Hyperlipemia    Peripheral vascular disease (HCC)    Prostate cancer (HCC)    Past Surgical History:  Procedure Laterality Date   ABDOMINAL AORTOGRAM W/LOWER EXTREMITY N/A 10/16/2017   Procedure: ABDOMINAL AORTOGRAM W/LOWER EXTREMITY;  Surgeon: Sherren Kerns, MD;  Location: [facility];  Service: Cardiovascular;  Laterality: N/A;   APPENDECTOMY     CARDIAC CATHETERIZATION N/A 11/13/2015   Procedure: Left Heart Cath and Coronary Angiography;  Surgeon: Dalia Heading, MD;  Location: [facility] INVASIVE CV [facility];  Service: Cardiovascular;  Laterality: N/A;   CATARACT EXTRACTION     COLON SURGERY  2008   right colon resection   COLONOSCOPY WITH PROPOFOL N/A 01/06/2018   Procedure: COLONOSCOPY WITH PROPOFOL;  Surgeon: Toledo, Boykin Nearing, MD;  Location: [facility] ENDOSCOPY;  Service: Gastroenterology;  Laterality: N/A;   ENDARTERECTOMY POPLITEAL Right 11/11/2017   FEMORAL-POPLITEAL BYPASS GRAFT Right 11/11/2017   Procedure: RIGHT POPLITEAL ENDARTERECTOMY;  Surgeon: Sherren Kerns, MD;  Location: [facility] OR;  Service:  Vascular;  Laterality: Right;   LIPOMA EXCISION Right    thigh   METACARPOPHALANGEAL JOINT ARTHROPLASTY     PATCH ANGIOPLASTY Right 11/11/2017   Procedure: PATCH ANGIOPLASTY of Superficial Fenoral Artery;  Surgeon: Sherren Kerns, MD;  Location: [facility] OR;  Service: Vascular;  Laterality: Right;   PROSTATECTOMY  2003   ULNAR NERVE TRANSPOSITION Left January 2015   Patient Active Problem List   Diagnosis Date Noted   Primary hypertension 10/17/2021   PAD (peripheral artery disease) (HCC) 10/09/2021   Hyperlipidemia LDL goal <70 10/08/2021   Other microscopic hematuria 10/08/2021   B12 deficiency 06/07/2021   Arthritis of right hand 04/11/2021   Achilles tendonitis, bilateral 03/16/2020   Bilateral Achilles tendonosis 05/16/2019   Claudication of calf muscles (HCC) 08/11/2017   Chronic bilateral low back pain with right-sided sciatica 08/11/2017   Carotid stenosis 05/05/2017   Presbycusis of both ears 03/20/2017   Pulsatile tinnitus of both ears 03/20/2017   H/O coronary artery bypass surgery 11/20/2015   Atherosclerotic heart disease of native coronary artery without angina pectoris 11/15/2015   HNP (herniated nucleus pulposus), lumbar 11/15/2014   Lumbar radiculitis 11/15/2014   Ulnar neuropathy of left upper extremity 10/13/2014   AC (acromioclavicular) arthritis 04/04/2013   CMC arthritis, thumb, degenerative 06/14/2012    PCP: Melida Quitter, MD  REFERRING PROVIDER: Tarry Kos, MD  REFERRING DIAG: [phone] (ICD-10-CM) - Facet joint disease  Rationale for Evaluation and Treatment: Rehabilitation  THERAPY DIAG:  Pain in left hip  Muscle weakness (generalized)  Other muscle spasm  Other abnormalities of gait and mobility  ONSET DATE: 03/2023  SUBJECTIVE:                                                                                                                                                                                           SUBJECTIVE  STATEMENT: Patient reports he is feel discouraged today. He went on a walk yesterday and was only able to tolerate walking less than 1/4th a mile and he had to turn around and walk home due to his increased hip pain. He did his warm up exercises before walking. Currently his pain is 4/10.   Eval: Patient reports Lt sided SIJ pain that began 03/2023. He went to [facility] and they did Xrays which were unremarkable. He went to PT for hip strengthening but it did not solve his pain he was having. He began having Lt hip pain as well as the buttock pain after completing around of PT. He normally walks 2.5-3 miles/ day 7 days a week but with the pain he was only able to tolerate 1.5 miles. He was then referred for an MRI and that is when he was diagnoses with a Lt gluteal minimus tear. The referring provider told him not to worry much about the tear and it is normal for his age. He reports always having a chronic limp but it has recently gotten worse. Recently he has not been walking for exercise, but he has been traveling outside of the [place]. Pt denies leg numbness & tingling. Functional activity limitations: walking >.25 miles, sleeping   PERTINENT HISTORY:  90% Lt Gluteus Minimus Tear, HTN, OA, Prostate Cancer, Prostatectomy 2003, Hx alcohol abuse (20 yrs sober)  From Doctor's Note:Recently had an MRI at [facility] that showed 90% left gluteus minimus tear. He states that the pain is 3 out of 10 at most. This causes him to walk with a Trendelenburg gait but he is also not interested in surgery. His main complaint is left-sided low back pain.   PAIN: 09/23/2023 Are you having pain? Yes: NPRS scale: 4/10 Pain location: Lt SIJ and Lt glute area Pain description: achy Aggravating factors: Walking >.25 miles; sleeping Relieving factors: Tylenol (helps a little bit)  PRECAUTIONS: None  RED FLAGS: None   WEIGHT BEARING RESTRICTIONS: No  FALLS:  Has patient fallen in last 6 months? No  LIVING  ENVIRONMENT: Lives with: lives with their family and  lives with their spouse Lives in: House/apartment Stairs: No Has following equipment at home: None  OCCUPATION: Retired  PLOF:Independent Leisure: Traveling   PATIENT GOALS: Get rid of this pain so I can walk and keep active  NEXT MD VISIT: PRN  OBJECTIVE:   DIAGNOSTIC FINDINGS:  Recently had an MRI at [facility] that showed 90% left gluteus minimus tear  PATIENT SURVEYS:  FOTO 58; goal: 69  SCREENING FOR RED FLAGS: Bowel or bladder incontinence: No Spinal tumors: No Cauda equina syndrome: No Compression fracture: No Abdominal aneurysm: No  COGNITION: Overall cognitive status: Within functional limits for tasks assessed      POSTURE: rounded shoulders and posterior pelvic tilt  PALPATION:  PA to SIJ reproduced pain Tenderness & taut bands to Lt gluteal complex    LOWER EXTREMITY ROM:     Passive  Right eval Left eval  Hip flexion [facility]  Hip extension [facility]  Hip abduction [facility] WFL  Hip adduction [facility]  Hip internal rotation    Hip external rotation    Knee flexion [facility] WFL  Knee extension [facility] WFL  Ankle dorsiflexion    Ankle plantarflexion    Ankle inversion    Ankle eversion     (Blank rows = not tested)  LOWER EXTREMITY MMT:    MMT Right eval Left eval  Hip flexion 5 5  Hip extension 4 4  Hip abduction 4 4  Hip adduction 4- 4-  Hip internal rotation    Hip external rotation    Knee flexion 5 5  Knee extension 5 5  Ankle dorsiflexion    Ankle plantarflexion    Ankle inversion    Ankle eversion     (Blank rows = not tested)  LUMBAR SPECIAL TESTS:  SI Compression/distraction test: Negative  FUNCTIONAL TESTS:  5 times sit to stand: 10.35 6 minute walk test: 1347ft ; pain after 2 minutes  09/17/2023: 6 MWT: 1483ft; notable antalgic gait at 2.5 minutes; pain started at 4.5 minutes; pain increased a 6 minute mark  GAIT: Distance walked: 1325 Assistive device utilized:  None Level of assistance: Complete Independence Comments: Antalgic gait; increased Lt gluteal min pain after 1.30 min that got worse as we continued to walk  TODAY'S TREATMENT:                                                                                                                              DATE:  09/23/2023 :  Elliptical 5 mins- PT present to discuss status Reverse Clamshells 2 x 10 each Bridging + abduction x 10 Bridging coming down with one leg x 10 each Hip Matrix (Abduction & Extension ) 2 x 10 each 35# Leg Press bilateral 120 lbs unilateral 65 lbs 2 x 10 each Step Ups + knee drive 6 inch step 2 x 10 ; unilateral 5 # KB hold Lateral Step Ups STS with 5 lb KB 2 x 10 Lateral lunges with slider  x 8 each Supine hamstring stretch with green strap 2 x 30 sec    09/21/2023 :  Elliptical 5 mins- PT present to discuss status Created walking warm up: standing heel raises, standing marching, and side steps see updated HEP below 2 inch Hip Hike 2x8 on Rt  Step Ups 6 inch step 2 x 10 ; unilateral 5 # KB hold STS with 5 lb KB 2 x 10 Leg Press bilateral 120 lbs unilateral 65 lbs 2 x 10 each Single leg balance on airex taping cones (forward, lateral, back) x 10 each unilateral UE support on bar Seated piriformis stretch 2 x 30 sec Side Step Ups 6 inch + knee drive 5# DB  2 x 10 Lateral Lunge x 10 next to barre for support if needed Supine hamstring stretch with green strap 2 x 30 sec  09/17/2023 :  Nu-Step L5 7 min while discussing status 2 inch Hip Hike 2x8 on Rt  Leg Press bilateral 110 lbs unilateral 60 lbs 2 x 10 each Single leg balance on airex taping cones x 10 each unilateral UE support on bar Lateral Lunge x 10 next to barre for support if needed 6 MWT: 1429ft; notable antalgic gait at 2.5 minutes; pain started at 4.5 minutes; pain increased a 6 minute mark Seated piriformis stretch 2 x 30 sec STS with 5 lb KB 2 x 8  Step Ups 6 inch step 2 x 10 ; unilateral 5 # KB  hold Side Step Ups 6 inch x 10 Hip flexor stretch on second stair 2 x 30 sec Stand hamstring stretch on second second 2 x 30 sec Education on variety of hamstring stretches to perform at home    PATIENT EDUCATION:  Education details: Hip weakness; hip strengthening exercises; POC Person educated: Patient Education method: Explanation, Demonstration, and Handouts Education comprehension: verbalized understanding, returned demonstration, and needs further education  HOME EXERCISE PROGRAM: Access Code: [email or web address] URL: [email or web address] Date: 09/21/2023 Prepared by: Claude Manges  Exercises - Standing Hip Abduction with Counter Support  - 1 x daily - 7 x weekly - 2 sets - 10 reps - Standing Hip Extension with Counter Support  - 1 x daily - 7 x weekly - 2 sets - 10 reps - Standing Hip Extension with Leg Bent and Support  - 1 x daily - 7 x weekly - 2 sets - 10 reps - Clamshell with Resistance  - 1 x daily - 7 x weekly - 1 sets - 10 reps - Seated Piriformis Stretch with Trunk Bend  - 1 x daily - 7 x weekly - 2 sets - 3 reps - 30 hold - Quadruped Fire Hydrant  - 1 x daily - 7 x weekly - 1 sets - 10 reps - Standing Hip ER Stretch at Table  - 1 x daily - 7 x weekly - 1 sets - 10 reps - 30 hold - Side Stepping with Resistance at Ankles  - 1 x daily - 7 x weekly - 1 sets - 10 reps - Heel Raises with Counter Support  - 1 x daily - 7 x weekly - 3 sets - 10 reps - Marching with Resistance  - 1 x daily - 7 x weekly - 1 sets - 10 reps   ASSESSMENT  CLINICAL IMPRESSION: Hasani continues to have limitations in his walking tolerance. He has only been able to walk 1/4 mile before having to turn around to go back home because of pain. Patient has shown good improvements in  physical therapy and is tolerating exercise progressions without increased pain. Incorporated more hip stability exercises this treatment session. Patient required verbal and tactile cues for correct performance. Patient  tolerance treatment session well and did not verbalize any increased pain. Patient will benefit from skilled PT to address the below impairments and improve overall function.     OBJECTIVE IMPAIRMENTS: Abnormal gait, difficulty walking, decreased strength, increased muscle spasms, and pain.   ACTIVITY LIMITATIONS: sleeping and walking >.25 miles  PARTICIPATION LIMITATIONS: community activity and walking tolerance while traveling  PERSONAL FACTORS: Age and 3+ comorbidities: HTN, OA, Hx of cancer  are also affecting patient's functional outcome.   REHAB POTENTIAL: Good  CLINICAL DECISION MAKING: Stable/uncomplicated  EVALUATION COMPLEXITY: Low   GOALS: Goals reviewed with patient? Yes  SHORT TERM GOALS: Target date: 09/28/2023   Patient will be independent with initial HEP. Baseline:  Goal status: MET 09/23/2023  2.  Patient will be able to walk 1.75 miles with < or = to 5/10 Lt hip/ buttock pain. Baseline: 1.5 miles; 7/10 pain Goal status: IN PROGRESS 09/23/2023    LONG TERM GOALS: Target date: 10/26/2023  Patient will demonstrate independence in advanced HEP. Baseline:  Goal status: INITIAL  2.  Patient will be able to walk 3 miles with < or = to 4/10 Lt hip/ buttock pain. Baseline: 1.5 miles; 7/10 pain Goal status: INITIAL   3.  Patient's FOTO score will increase to 69 for improved function. Baseline: 58 Goal status: INITIAL  4.  Patient will report waking up < or = to 2x/night with hip pain.  Baseline: frequently Goal status: MET 09/23/2023   PLAN:  PT FREQUENCY: 2x/week  PT DURATION: 8 weeks  PLANNED INTERVENTIONS: Therapeutic exercises, Therapeutic activity, Neuromuscular re-education, Balance training, Gait training, Patient/Family education, Self Care, Joint mobilization, Joint manipulation, Stair training, Vestibular training, Canalith repositioning, Aquatic Therapy, Dry Needling, Electrical stimulation, Spinal manipulation, Spinal mobilization,  Cryotherapy, Moist heat, Taping, Vasopneumatic device, Traction, Ultrasound, Ionotophoresis 4mg /ml Dexamethasone, and Manual therapy.  PLAN FOR NEXT SESSION: continue progressing LE strenghtening exercises; assess walking tolerance  Claude Manges, PT 09/23/23 10:23 AM   [facility] [address], [address] [place], [place] [address] Phone # [phone] Fax [phone]

## 2023-09-25 ENCOUNTER — Telehealth: Payer: Self-pay | Admitting: Gastroenterology

## 2023-09-25 NOTE — Telephone Encounter (Signed)
Good Afternoon Dr Ethelene Hal MD PM  Patient preferring [facility] location   We received a call from patient wanting to have a colonoscopy. Patient last procedure was in 2019. Records are available for review in epic. Please review and advise on scheduling.   Thank you

## 2023-09-25 NOTE — Telephone Encounter (Signed)
With his age and medical issues, he needs to be seen in the office.  Please make next available appointment with me or an APP.  At that visit, his overall health and previous GI procedure records will be reviewed.  Ellwood Dense, MD

## 2023-09-28 ENCOUNTER — Encounter: Payer: Self-pay | Admitting: Physical Therapy

## 2023-09-28 ENCOUNTER — Ambulatory Visit: Payer: Medicare Other | Admitting: Physical Therapy

## 2023-09-28 DIAGNOSIS — R2689 Other abnormalities of gait and mobility: Secondary | ICD-10-CM

## 2023-09-28 DIAGNOSIS — M25552 Pain in left hip: Secondary | ICD-10-CM | POA: Diagnosis not present

## 2023-09-28 DIAGNOSIS — M62838 Other muscle spasm: Secondary | ICD-10-CM

## 2023-09-28 DIAGNOSIS — M6281 Muscle weakness (generalized): Secondary | ICD-10-CM

## 2023-09-28 NOTE — Therapy (Signed)
OUTPATIENT PHYSICAL THERAPY LOWER EXTREMITY TREATMENT NOTE   Patient Name: Willie Burton MRN: [id number] DOB:11-09-1946, 77 y.o., male Today's Date: 09/28/2023  END OF SESSION:  PT End of Session - 09/28/23 1358     Visit Number 9    Date for PT Re-Evaluation 10/26/23    Authorization Type Medicare/ [employer] Blue [employer]    Authorization Time Period Kx modifier after 15th visit    Progress Note Due on Visit 10    PT Start Time 0934    PT Stop Time 1018    PT Time Calculation (min) 44 min    Activity Tolerance Patient tolerated treatment well    Behavior During Therapy WFL for tasks assessed/performed                    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   Arthritis    Coronary artery disease    Diverticula of colon    Dysrhythmia    PACs, PVCs   Heart disease    High blood pressure    High cholesterol    Hyperlipemia    Peripheral vascular disease (HCC)    Prostate cancer (HCC)    Past Surgical History:  Procedure Laterality Date   ABDOMINAL AORTOGRAM W/LOWER EXTREMITY N/A 10/16/2017   Procedure: ABDOMINAL AORTOGRAM W/LOWER EXTREMITY;  Surgeon: Sherren Kerns, MD;  Location: [facility];  Service: Cardiovascular;  Laterality: N/A;   APPENDECTOMY     CARDIAC CATHETERIZATION N/A 11/13/2015   Procedure: Left Heart Cath and Coronary Angiography;  Surgeon: Dalia Heading, MD;  Location: [facility] INV[facility]IVE CV [facility];  Service: Cardiovascular;  Laterality: N/A;   CATARACT EXTRACTION     COLON SURGERY  2008   right colon resection   COLONOSCOPY WITH PROPOFOL N/A 01/06/2018   Procedure: COLONOSCOPY WITH PROPOFOL;  Surgeon: Toledo, Boykin Nearing, MD;  Location: [facility] ENDOSCOPY;  Service: Gastroenterology;  Laterality: N/A;   ENDARTERECTOMY POPLITEAL Right 11/11/2017   FEMORAL-POPLITEAL BYPASS GRAFT Right 11/11/2017   Procedure: RIGHT POPLITEAL ENDARTERECTOMY;  Surgeon: Sherren Kerns, MD;  Location: [facility] OR;   Service: Vascular;  Laterality: Right;   LIPOMA EXCISION Right    thigh   METACARPOPHALANGEAL JOINT ARTHROPLASTY     PATCH ANGIOPLASTY Right 11/11/2017   Procedure: PATCH ANGIOPLASTY of Superficial Fenoral Artery;  Surgeon: Sherren Kerns, MD;  Location: [facility] OR;  Service: Vascular;  Laterality: Right;   PROSTATECTOMY  2003   ULNAR NERVE TRANSPOSITION Left January 2015   Patient Active Problem List   Diagnosis Date Noted   Primary hypertension 10/17/2021   PAD (peripheral artery disease) (HCC) 10/09/2021   Hyperlipidemia LDL goal <70 10/08/2021   Other microscopic hematuria 10/08/2021   B12 deficiency 06/07/2021   Arthritis of right hand 04/11/2021   Achilles tendonitis, bilateral 03/16/2020   Bilateral Achilles tendonosis 05/16/2019   Claudication of calf muscles (HCC) 08/11/2017   Chronic bilateral low back pain with right-sided sciatica 08/11/2017   Carotid stenosis 05/05/2017   Presbycusis of both ears 03/20/2017   Pulsatile tinnitus of both ears 03/20/2017   H/O coronary artery bypass surgery 11/20/2015   Atherosclerotic heart disease of native coronary artery without angina pectoris 11/15/2015   HNP (herniated nucleus pulposus), lumbar 11/15/2014   Lumbar radiculitis 11/15/2014   Ulnar neuropathy of left upper extremity 10/13/2014   AC (acromioclavicular) arthritis 04/04/2013   CMC arthritis, thumb, degenerative 06/14/2012    PCP: Melida Quitter, MD  REFERRING PROVIDER: Tarry Kos, MD  REFERRING DIAG: [phone] (ICD-10-CM) - Facet joint disease  Rationale for Evaluation and Treatment: Rehabilitation  THERAPY DIAG:  Pain in left hip  Muscle weakness (generalized)  Other muscle spasm  Other abnormalities of gait and mobility  ONSET DATE: 03/2023  SUBJECTIVE:                                                                                                                                                                                           SUBJECTIVE  STATEMENT: Patient verbalized he is feeling discouraged because he has not been able to improve his walking tolerance since starting physical therapy. He feels stronger in his hips, but has not been able to walk further than 1/4th mile.  Eval: Patient reports Lt sided SIJ pain that began 03/2023. He went to [facility] and they did Xrays which were unremarkable. He went to PT for hip strengthening but it did not solve his pain he was having. He began having Lt hip pain as well as the buttock pain after completing around of PT. He normally walks 2.5-3 miles/ day 7 days a week but with the pain he was only able to tolerate 1.5 miles. He was then referred for an MRI and that is when he was diagnoses with a Lt gluteal minimus tear. The referring provider told him not to worry much about the tear and it is normal for his age. He reports always having a chronic limp but it has recently gotten worse. Recently he has not been walking for exercise, but he has been traveling outside of the country. Pt denies leg numbness & tingling. Functional activity limitations: walking >.25 miles, sleeping   PERTINENT HISTORY:  90% Lt Gluteus Minimus Tear, HTN, OA, Prostate Cancer, Prostatectomy 2003, Hx alcohol abuse (20 yrs sober)  From Doctor's Note:Recently had an MRI at [facility] that showed 90% left gluteus minimus tear. He states that the pain is 3 out of 10 at most. This causes him to walk with a Trendelenburg gait but he is also not interested in surgery. His main complaint is left-sided low back pain.   PAIN: 09/23/2023 Are you having pain? Yes: NPRS scale: 4/10 Pain location: Lt SIJ and Lt glute area Pain description: achy Aggravating factors: Walking >.25 miles; sleeping Relieving factors: Tylenol (helps a little bit)  PRECAUTIONS: None  RED FLAGS: None   WEIGHT BEARING RESTRICTIONS: No  FALLS:  Has patient fallen in last 6 months? No  LIVING ENVIRONMENT: Lives with: lives with their family and  lives with their spouse Lives in: House/apartment Stairs: No Has following equipment at home: None  OCCUPATION: Retired  PLOF:Independent Leisure: Traveling   PATIENT GOALS: Get rid of this pain so I can walk and keep active  NEXT MD VISIT: PRN  OBJECTIVE:   DIAGNOSTIC FINDINGS:  Recently had an MRI at [facility] that showed 90% left gluteus minimus tear  PATIENT SURVEYS:  FOTO 58; goal: 53  SCREENING FOR RED FLAGS: Bowel or bladder incontinence: No Spinal tumors: No Cauda equina syndrome: No Compression fracture: No Abdominal aneurysm: No  COGNITION: Overall cognitive status: Within functional limits for tasks assessed      POSTURE: rounded shoulders and posterior pelvic tilt  PALPATION:  PA to SIJ reproduced pain Tenderness & taut bands to Lt gluteal complex    LOWER EXTREMITY ROM:     Passive  Right eval Left eval  Hip flexion [facility]  Hip extension [facility]  Hip abduction [facility] WFL  Hip adduction [facility]  Hip internal rotation    Hip external rotation    Knee flexion [facility] WFL  Knee extension [facility] WFL  Ankle dorsiflexion    Ankle plantarflexion    Ankle inversion    Ankle eversion     (Blank rows = not tested)  LOWER EXTREMITY MMT:    MMT Right eval Left eval  Hip flexion 5 5  Hip extension 4 4  Hip abduction 4 4  Hip adduction 4- 4-  Hip internal rotation    Hip external rotation    Knee flexion 5 5  Knee extension 5 5  Ankle dorsiflexion    Ankle plantarflexion    Ankle inversion    Ankle eversion     (Blank rows = not tested)  LUMBAR SPECIAL TESTS:  SI Compression/distraction test: Negative  FUNCTIONAL TESTS:  5 times sit to stand: 10.35 6 minute walk test: 1356ft ; pain after 2 minutes  09/17/2023: 6 MWT: 145ft; notable antalgic gait at 2.5 minutes; pain started at 4.5 minutes; pain increased a 6 minute mark  GAIT: Distance walked: 1325 Assistive device utilized: None Level of assistance: Complete Independence Comments:  Antalgic gait; increased Lt gluteal min pain after 1.30 min that got worse as we continued to walk  TODAY'S TREATMENT:                                                                                                                              DATE:  09/28/2023 :  NuStep Level 5 8 mins - PT present to discuss status Leg Press bilateral 130 lbs unilateral 65 lbs 2 x 10 each Bridging + abduction against red loop x 10 Bridging coming down with one leg 2 x 10 each Hip Matrix (Abduction & Extension ) 2 x 10 each 35# Seated figure four stretch 2 x 30 sec Kinesiotaping to Lt glute minimus    09/23/2023 :  Elliptical 5 mins- PT present to discuss status Reverse Clamshells 2 x 10 each Bridging + abduction x 10 Bridging coming down with one leg x 10 each  Hip Matrix (Abduction & Extension ) 2 x 10 each 35# Leg Press bilateral 120 lbs unilateral 65 lbs 2 x 10 each Step Ups + knee drive 6 inch step 2 x 10 ; unilateral 5 # KB hold Lateral Step Ups STS with 5 lb KB 2 x 10 Lateral lunges with slider x 8 each Supine hamstring stretch with green strap 2 x 30 sec    09/21/2023 :  Elliptical 5 mins- PT present to discuss status Created walking warm up: standing heel raises, standing marching, and side steps see updated HEP below 2 inch Hip Hike 2x8 on Rt  Step Ups 6 inch step 2 x 10 ; unilateral 5 # KB hold STS with 5 lb KB 2 x 10 Leg Press bilateral 120 lbs unilateral 65 lbs 2 x 10 each Single leg balance on airex taping cones (forward, lateral, back) x 10 each unilateral UE support on bar Seated piriformis stretch 2 x 30 sec Side Step Ups 6 inch + knee drive 5# DB  2 x 10 Lateral Lunge x 10 next to barre for support if needed Supine hamstring stretch with green strap 2 x 30 sec    PATIENT EDUCATION:  Education details: Hip weakness; hip strengthening exercises; POC Person educated: Patient Education method: Explanation, Demonstration, and Handouts Education comprehension: verbalized  understanding, returned demonstration, and needs further education  HOME EXERCISE PROGRAM: Access Code: [email or web address] URL: [email or web address] Date: 09/21/2023 Prepared by: Claude Manges  Exercises - Standing Hip Abduction with Counter Support  - 1 x daily - 7 x weekly - 2 sets - 10 reps - Standing Hip Extension with Counter Support  - 1 x daily - 7 x weekly - 2 sets - 10 reps - Standing Hip Extension with Leg Bent and Support  - 1 x daily - 7 x weekly - 2 sets - 10 reps - Clamshell with Resistance  - 1 x daily - 7 x weekly - 1 sets - 10 reps - Seated Piriformis Stretch with Trunk Bend  - 1 x daily - 7 x weekly - 2 sets - 3 reps - 30 hold - Quadruped Fire Hydrant  - 1 x daily - 7 x weekly - 1 sets - 10 reps - Standing Hip ER Stretch at Table  - 1 x daily - 7 x weekly - 1 sets - 10 reps - 30 hold - Side Stepping with Resistance at Ankles  - 1 x daily - 7 x weekly - 1 sets - 10 reps - Heel Raises with Counter Support  - 1 x daily - 7 x weekly - 3 sets - 10 reps - Marching with Resistance  - 1 x daily - 7 x weekly - 1 sets - 10 reps   ASSESSMENT  CLINICAL IMPRESSION: Today's treatment session focused on LE strengthening. Patient was a little discouraged today because he has not been able to walk longer than 1/4 mile before experiencing increased hip pain. Educated patient on gait mechanics and anatomy of glute minimus. He has an appointment scheduled 10/01/2023 with a new physician to explore his options about his pain. Performed kinesio taping to Lt glute minimus for hip support while walking. Patient tolerated treatment session well and did not verbalize any increased pain. Patient will benefit from skilled PT to address the below impairments and improve overall function.     OBJECTIVE IMPAIRMENTS: Abnormal gait, difficulty walking, decreased strength, increased muscle spasms, and pain.   ACTIVITY LIMITATIONS: sleeping and walking >.25 miles  PARTICIPATION LIMITATIONS:  community activity and walking tolerance while traveling  PERSONAL FACTORS: Age and 3+ comorbidities: HTN, OA, Hx of cancer  are also affecting patient's functional outcome.   REHAB POTENTIAL: Good  CLINICAL DECISION MAKING: Stable/uncomplicated  EVALUATION COMPLEXITY: Low   GOALS: Goals reviewed with patient? Yes  SHORT TERM GOALS: Target date: 09/28/2023   Patient will be independent with initial HEP. Baseline:  Goal status: MET 09/23/2023  2.  Patient will be able to walk 1.75 miles with < or = to 5/10 Lt hip/ buttock pain. Baseline: 1.5 miles; 7/10 pain Goal status: IN PROGRESS 09/23/2023    LONG TERM GOALS: Target date: 10/26/2023  Patient will demonstrate independence in advanced HEP. Baseline:  Goal status: INITIAL  2.  Patient will be able to walk 3 miles with < or = to 4/10 Lt hip/ buttock pain. Baseline: 1.5 miles; 7/10 pain Goal status: INITIAL   3.  Patient's FOTO score will increase to 69 for improved function. Baseline: 58 Goal status: INITIAL  4.  Patient will report waking up < or = to 2x/night with hip pain.  Baseline: frequently Goal status: MET 09/23/2023   PLAN:  PT FREQUENCY: 2x/week  PT DURATION: 8 weeks  PLANNED INTERVENTIONS: Therapeutic exercises, Therapeutic activity, Neuromuscular re-education, Balance training, Gait training, Patient/Family education, Self Care, Joint mobilization, Joint manipulation, Stair training, Vestibular training, Canalith repositioning, Aquatic Therapy, Dry Needling, Electrical stimulation, Spinal manipulation, Spinal mobilization, Cryotherapy, Moist heat, Taping, Vasopneumatic device, Traction, Ultrasound, Ionotophoresis 4mg /ml Dexamethasone, and Manual therapy.  PLAN FOR NEXT SESSION: assess kinesiotaping & doctor's appointment; 10th visit PN due  Claude Manges, PT 09/28/23 2:00 PM  [facility] [address], [address] [place], [place] [address] Phone # [phone] Fax  [phone]

## 2023-09-29 ENCOUNTER — Encounter: Payer: Self-pay | Admitting: Gastroenterology

## 2023-09-30 ENCOUNTER — Encounter: Payer: Medicare Other | Admitting: Physical Therapy

## 2023-10-06 ENCOUNTER — Encounter: Payer: Medicare Other | Admitting: Physical Therapy

## 2023-10-08 ENCOUNTER — Encounter: Payer: Medicare Other | Admitting: Physical Therapy

## 2023-10-12 ENCOUNTER — Encounter: Payer: Medicare Other | Admitting: Physical Therapy

## 2023-10-14 ENCOUNTER — Encounter: Payer: Self-pay | Admitting: Physical Therapy

## 2023-10-14 ENCOUNTER — Ambulatory Visit: Payer: Medicare Other | Attending: Orthopaedic Surgery | Admitting: Physical Therapy

## 2023-10-14 DIAGNOSIS — M6281 Muscle weakness (generalized): Secondary | ICD-10-CM | POA: Insufficient documentation

## 2023-10-14 DIAGNOSIS — R2689 Other abnormalities of gait and mobility: Secondary | ICD-10-CM | POA: Insufficient documentation

## 2023-10-14 DIAGNOSIS — M62838 Other muscle spasm: Secondary | ICD-10-CM | POA: Insufficient documentation

## 2023-10-14 DIAGNOSIS — M25552 Pain in left hip: Secondary | ICD-10-CM | POA: Insufficient documentation

## 2023-10-14 NOTE — Therapy (Signed)
OUTPATIENT PHYSICAL THERAPY LOWER EXTREMITY  DISCHARGE NOTE   Patient Name: Willie Burton MRN: [id number] DOB:Jun 06, 1946, 77 y.o., male Today's Date: 10/14/2023   END OF SESSION:  PT End of Session - 10/14/23 1246     Visit Number 10    Date for PT Re-Evaluation 10/26/23    Authorization Type Medicare/ Blue Cross Blue [employer]    Authorization Time Period Kx modifier after 15th visit    Progress Note Due on Visit 10    PT Start Time 0931    PT Stop Time 1011    PT Time Calculation (min) 40 min    Activity Tolerance Patient tolerated treatment well    Behavior During Therapy [facility] for tasks assessed/performed                     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   Arthritis    Coronary artery disease    Diverticula of colon    Dysrhythmia    PACs, PVCs   Heart disease    High blood pressure    High cholesterol    Hyperlipemia    Peripheral vascular disease (HCC)    Prostate cancer (HCC)    Past Surgical History:  Procedure Laterality Date   ABDOMINAL AORTOGRAM W/LOWER EXTREMITY N/A 10/16/2017   Procedure: ABDOMINAL AORTOGRAM W/LOWER EXTREMITY;  Surgeon: Sherren Kerns, MD;  [facility];  Service: Cardiovascular;  Laterality: N/A;   APPENDECTOMY     CARDIAC CATHETERIZATION N/A 11/13/2015   Procedure: Left Heart Cath and Coronary Angiography;  Surgeon: Dalia Heading, MD;  Location: [facility] INVASIVE CV LAB;  Service: Cardiovascular;  Laterality: N/A;   CATARACT EXTRACTION     COLON SURGERY  2008   right colon resection   COLONOSCOPY WITH PROPOFOL N/A 01/06/2018   Procedure: COLONOSCOPY WITH PROPOFOL;  Surgeon: Toledo, Boykin Nearing, MD;  Location: [facility] ENDOSCOPY;  Service: Gastroenterology;  Laterality: N/A;   ENDARTERECTOMY POPLITEAL Right 11/11/2017   FEMORAL-POPLITEAL BYPASS GRAFT Right 11/11/2017   Procedure: RIGHT POPLITEAL ENDARTERECTOMY;  Surgeon: Sherren Kerns, MD;  Location: [facility] OR;   Service: Vascular;  Laterality: Right;   LIPOMA EXCISION Right    thigh   METACARPOPHALANGEAL JOINT ARTHROPLASTY     PATCH ANGIOPLASTY Right 11/11/2017   Procedure: PATCH ANGIOPLASTY of Superficial Fenoral Artery;  Surgeon: Sherren Kerns, MD;  Location: [facility] OR;  Service: Vascular;  Laterality: Right;   PROSTATECTOMY  2003   ULNAR NERVE TRANSPOSITION Left January 2015   Patient Active Problem List   Diagnosis Date Noted   Primary hypertension 10/17/2021   PAD (peripheral artery disease) (HCC) 10/09/2021   Hyperlipidemia LDL goal <70 10/08/2021   Other microscopic hematuria 10/08/2021   B12 deficiency 06/07/2021   Arthritis of right hand 04/11/2021   Achilles tendonitis, bilateral 03/16/2020   Bilateral Achilles tendonosis 05/16/2019   Claudication of calf muscles (HCC) 08/11/2017   Chronic bilateral low back pain with right-sided sciatica 08/11/2017   Carotid stenosis 05/05/2017   Presbycusis of both ears 03/20/2017   Pulsatile tinnitus of both ears 03/20/2017   H/O coronary artery bypass surgery 11/20/2015   Atherosclerotic heart disease of native coronary artery without angina pectoris 11/15/2015   HNP (herniated nucleus pulposus), lumbar 11/15/2014   Lumbar radiculitis 11/15/2014   Ulnar neuropathy of left upper extremity 10/13/2014   AC (acromioclavicular) arthritis 04/04/2013   CMC arthritis, thumb, degenerative 06/14/2012    PCP: Nadene Rubins,  Nyoka Cowden, MD  REFERRING PROVIDER: Tarry Kos, MD  REFERRING DIAG: [phone] (ICD-10-CM) - Facet joint disease  Rationale for Evaluation and Treatment: Rehabilitation  THERAPY DIAG:  Pain in left hip  Muscle weakness (generalized)  Other muscle spasm  Other abnormalities of gait and mobility  ONSET DATE: 03/2023  SUBJECTIVE:                                                                                                                                                                                           SUBJECTIVE  STATEMENT: Patient reports he is not doing the best today. He went to the PA of a hip specialist and he was told he had hip impingement and a torn labrum a long with the glute med tear. He had a cortisone shot on Saturday and he has not found relief from it.  Eval: Patient reports Lt sided SIJ pain that began 03/2023. He went to [facility] and they did Xrays which were unremarkable. He went to PT for hip strengthening but it did not solve his pain he was having. He began having Lt hip pain as well as the buttock pain after completing around of PT. He normally walks 2.5-3 miles/ day 7 days a week but with the pain he was only able to tolerate 1.5 miles. He was then referred for an MRI and that is when he was diagnoses with a Lt gluteal minimus tear. The referring provider told him not to worry much about the tear and it is normal for his age. He reports always having a chronic limp but it has recently gotten worse. Recently he has not been walking for exercise, but he has been traveling outside of the country. Pt denies leg numbness & tingling. Functional activity limitations: walking >.25 miles, sleeping   PERTINENT HISTORY:  90% Lt Gluteus Minimus Tear, HTN, OA, Prostate Cancer, Prostatectomy 2003, Hx alcohol abuse (20 yrs sober)  From Doctor's Note:Recently had an MRI at [facility] that showed 90% left gluteus minimus tear. He states that the pain is 3 out of 10 at most. This causes him to walk with a Trendelenburg gait but he is also not interested in surgery. His main complaint is left-sided low back pain.   PAIN: 09/23/2023 Are you having pain? Yes: NPRS scale: 4/10 Pain location: Lt SIJ and Lt glute area Pain description: achy Aggravating factors: Walking >.25 miles; sleeping Relieving factors: Tylenol (helps a little bit)  PRECAUTIONS: None  RED FLAGS: None   WEIGHT BEARING RESTRICTIONS: No  FALLS:  Has patient fallen in last 6 months? No  LIVING ENVIRONMENT: Lives with: lives  with their  family and lives with their spouse Lives in: House/apartment Stairs: No Has following equipment at home: None  OCCUPATION: Retired  PLOF:Independent Leisure: Traveling   PATIENT GOALS: Get rid of this pain so I can walk and keep active  NEXT MD VISIT: PRN  OBJECTIVE:   DIAGNOSTIC FINDINGS:  Recently had an MRI at [facility] that showed 90% left gluteus minimus tear  PATIENT SURVEYS:  FOTO 58; goal: 16 FOTO 10/14/2023: 55  SCREENING FOR RED FLAGS: Bowel or bladder incontinence: No Spinal tumors: No Cauda equina syndrome: No Compression fracture: No Abdominal aneurysm: No  COGNITION: Overall cognitive status: Within functional limits for tasks assessed      POSTURE: rounded shoulders and posterior pelvic tilt  PALPATION:  PA to SIJ reproduced pain Tenderness & taut bands to Lt gluteal complex    LOWER EXTREMITY ROM:     Passive  Right eval Left eval  Hip flexion [facility]  Hip extension [facility]  Hip abduction [facility] WFL  Hip adduction [facility]  Hip internal rotation    Hip external rotation    Knee flexion [facility] WFL  Knee extension [facility] WFL  Ankle dorsiflexion    Ankle plantarflexion    Ankle inversion    Ankle eversion     (Blank rows = not tested)  LOWER EXTREMITY MMT:    MMT Right eval Left eval  Hip flexion 5 5  Hip extension 4 4  Hip abduction 4 4  Hip adduction 4- 4-  Hip internal rotation    Hip external rotation    Knee flexion 5 5  Knee extension 5 5  Ankle dorsiflexion    Ankle plantarflexion    Ankle inversion    Ankle eversion     (Blank rows = not tested)  LUMBAR SPECIAL TESTS:  SI Compression/distraction test: Negative  FUNCTIONAL TESTS:  5 times sit to stand: 10.35 6 minute walk test: 1336ft ; pain after 2 minutes  09/17/2023: 6 MWT: 1485ft; notable antalgic gait at 2.5 minutes; pain started at 4.5 minutes; pain increased a 6 minute mark  GAIT: Distance walked: 1325 Assistive device utilized: None Level of  assistance: Complete Independence Comments: Antalgic gait; increased Lt gluteal min pain after 1.30 min that got worse as we continued to walk  TODAY'S TREATMENT:                                                                                                                              DATE:  10/14/2023 :  NuStep Level 5 9 mins - PT present to discuss status FOTO: 55 Leg Press bilateral 145 lbs unilateral 75 lbs 2 x 12 each 6 in step ups with 10# unilateral KB hold x 20 each leg 6 in side step ups 10# unilateral KB hold x 20 each leg Sit to stands holding 10lb KB 2 x 10 Seated figure four on Lt 2 x 30 sec Supine hamstring stretch with green strap 2 x 30  sec each Supine adductor stretch with green strap 2 x 30 sec each Updated patient's HEP   09/28/2023 :  NuStep Level 5 8 mins - PT present to discuss status Leg Press bilateral 130 lbs unilateral 65 lbs 2 x 10 each Bridging + abduction against red loop x 10 Bridging coming down with one leg 2 x 10 each Hip Matrix (Abduction & Extension ) 2 x 10 each 35# Seated figure four stretch 2 x 30 sec Kinesiotaping to Lt glute minimus    09/23/2023 :  Elliptical 5 mins- PT present to discuss status Reverse Clamshells 2 x 10 each Bridging + abduction x 10 Bridging coming down with one leg x 10 each Hip Matrix (Abduction & Extension ) 2 x 10 each 35# Leg Press bilateral 120 lbs unilateral 65 lbs 2 x 10 each Step Ups + knee drive 6 inch step 2 x 10 ; unilateral 5 # KB hold Lateral Step Ups STS with 5 lb KB 2 x 10 Lateral lunges with slider x 8 each Supine hamstring stretch with green strap 2 x 30 sec    PATIENT EDUCATION:  Education details: Hip weakness; hip strengthening exercises; POC Person educated: Patient Education method: Explanation, Demonstration, and Handouts Education comprehension: verbalized understanding, returned demonstration, and needs further education  HOME EXERCISE PROGRAM: Access Code: [email or web address] URL:  [email or web address] Date: 10/14/2023 Prepared by: Claude Manges  Exercises - Standing Hip Abduction with Counter Support  - 1 x daily - 7 x weekly - 2 sets - 10 reps - Standing Hip Extension with Counter Support  - 1 x daily - 7 x weekly - 2 sets - 10 reps - Standing Hip Extension with Leg Bent and Support  - 1 x daily - 7 x weekly - 2 sets - 10 reps - Clamshell with Resistance  - 1 x daily - 7 x weekly - 1 sets - 10 reps - Seated Piriformis Stretch with Trunk Bend  - 1 x daily - 7 x weekly - 2 sets - 3 reps - 30 hold - Quadruped Fire Hydrant  - 1 x daily - 7 x weekly - 1 sets - 10 reps - Standing Hip ER Stretch at Table  - 1 x daily - 7 x weekly - 1 sets - 10 reps - 30 hold - Side Stepping with Resistance at Ankles  - 1 x daily - 7 x weekly - 1 sets - 10 reps - Heel Raises with Counter Support  - 1 x daily - 7 x weekly - 3 sets - 10 reps - Marching with Resistance  - 1 x daily - 7 x weekly - 1 sets - 10 reps - Step Up  - 1 x daily - 7 x weekly - 2 sets - 10 reps - Lateral Step Up  - 1 x daily - 7 x weekly - 2 sets - 10 reps - Bridge with Hip Abduction and Resistance  - 1 x daily - 7 x weekly - 2 sets - 10 reps - Airline pilot with Eccentric Lowering  - 1 x daily - 7 x weekly - 2 sets - 10 reps   ASSESSMENT  CLINICAL IMPRESSION: [place] has made minimal improvements since starting therapy. Patient verbalized feeling stronger, however his walking tolerance has decreased since starting therapy. His main goal was to be able to walk 3 miles without hip pain and he has not been able to accomplish that. He recently had another doctor's appointment in regards to his hip and he  received a cortisone shot and that has not provided him any relief. Patient has been compliant with HEP and incorporating exercises done in physical therapy at home. Patient is in agreement that he has come to the end of his therapy POC due to his minimal improvements. Updated patient's HEP to include higher  level hip strengthening exercises. Patient to be discharged from PT.    OBJECTIVE IMPAIRMENTS: Abnormal gait, difficulty walking, decreased strength, increased muscle spasms, and pain.   ACTIVITY LIMITATIONS: sleeping and walking >.25 miles  PARTICIPATION LIMITATIONS: community activity and walking tolerance while traveling  PERSONAL FACTORS: Age and 3+ comorbidities: HTN, OA, Hx of cancer  are also affecting patient's functional outcome.   REHAB POTENTIAL: Good  CLINICAL DECISION MAKING: Stable/uncomplicated  EVALUATION COMPLEXITY: Low   GOALS: Goals reviewed with patient? Yes  SHORT TERM GOALS: Target date: 09/28/2023   Patient will be independent with initial HEP. Baseline:  Goal status: MET 09/23/2023  2.  Patient will be able to walk 1.75 miles with < or = to 5/10 Lt hip/ buttock pain. Baseline: 1.5 miles; 7/10 pain Goal status: NOT MET 10/14/2023    LONG TERM GOALS: Target date: 10/26/2023  Patient will demonstrate independence in advanced HEP. Baseline:  Goal status: MET 10/14/2023  2.  Patient will be able to walk 3 miles with < or = to 4/10 Lt hip/ buttock pain. Baseline: 1.5 miles; 7/10 pain Goal status: NOT MET 10/14/2023   3.  Patient's FOTO score will increase to 69 for improved function. Baseline: 58 Goal status: NOT MET 10/14/2023  4.  Patient will report waking up < or = to 2x/night with hip pain.  Baseline: frequently Goal status: MET 09/23/2023   PLAN:  PT FREQUENCY: 2x/week  PT DURATION: 8 weeks  PLANNED INTERVENTIONS: Therapeutic exercises, Therapeutic activity, Neuromuscular re-education, Balance training, Gait training, Patient/Family education, Self Care, Joint mobilization, Joint manipulation, Stair training, Vestibular training, Canalith repositioning, Aquatic Therapy, Dry Needling, Electrical stimulation, Spinal manipulation, Spinal mobilization, Cryotherapy, Moist heat, Taping, Vasopneumatic device, Traction, Ultrasound,  Ionotophoresis 4mg /ml Dexamethasone, and Manual therapy.  PLAN FOR NEXT SESSION: assess kinesiotaping & doctor's appointment; 10th visit PN due  PHYSICAL THERAPY DISCHARGE SUMMARY  Visits from Start of Care: 9  Current functional level related to goals / functional outcomes: See clinical impression above   Remaining deficits: Walking greater than 1/4th mile   Education / Equipment: See above   Patient agrees to discharge. Patient goals were partially met. Patient is being discharged due to lack of progress.   Claude Manges, PT 10/14/23 12:47 PM  [facility] [address], [address] [place], [place] [address] Phone # [phone] Fax [phone]

## 2023-10-19 ENCOUNTER — Encounter: Payer: Self-pay | Admitting: Gastroenterology

## 2023-10-19 ENCOUNTER — Ambulatory Visit (INDEPENDENT_AMBULATORY_CARE_PROVIDER_SITE_OTHER): Payer: Medicare Other | Admitting: Gastroenterology

## 2023-10-19 ENCOUNTER — Encounter: Payer: Medicare Other | Admitting: Physical Therapy

## 2023-10-19 VITALS — BP 112/62 | HR 64 | Ht 69.0 in | Wt 201.0 lb

## 2023-10-19 DIAGNOSIS — Z860100 Personal history of colon polyps, unspecified: Secondary | ICD-10-CM | POA: Diagnosis not present

## 2023-10-19 MED ORDER — NA SULFATE-K SULFATE-MG SULF 17.5-3.13-1.6 GM/177ML PO SOLN
1.0000 | Freq: Once | ORAL | 0 refills | Status: AC
Start: 2023-10-19 — End: 2023-10-19

## 2023-10-19 NOTE — Patient Instructions (Addendum)
You have been scheduled for a colonoscopy. Please follow written instructions given to you at your visit today.   Please pick up your prep supplies at the pharmacy within the next 1-3 days.  If you use inhalers (even only as needed), please bring them with you on the day of your procedure.  DO NOT TAKE 7 DAYS PRIOR TO TEST- Trulicity (dulaglutide) Ozempic, Wegovy (semaglutide) Mounjaro (tirzepatide) Bydureon Bcise (exanatide extended release)  DO NOT TAKE 1 DAY PRIOR TO YOUR TEST Rybelsus (semaglutide) Adlyxin (lixisenatide) Victoza (liraglutide) Byetta (exanatide)  _______________________________________________________  If your blood pressure at your visit was 140/90 or greater, please contact your primary care physician to follow up on this.  _______________________________________________________  If you are age 62 or older, your body mass index should be between 23-30. Your Body mass index is 29.68 kg/m. If this is out of the aforementioned range listed, please consider follow up with your Primary Care Provider.  If you are age 85 or younger, your body mass index should be between 19-25. Your Body mass index is 29.68 kg/m. If this is out of the aformentioned range listed, please consider follow up with your Primary Care Provider.   ________________________________________________________  The [facility] providers would like to encourage you to use [facility] to communicate with providers for non-urgent requests or questions.  Due to long hold times on the telephone, sending your provider a message by [facility] may be a faster and more efficient way to get a response.  Please allow 48 business hours for a response.  Please remember that this is for non-urgent requests.  _______________________________________________________

## 2023-10-19 NOTE — Progress Notes (Signed)
10/19/2023 Willie Burton [id number] 1946-08-24   HISTORY OF PRESENT ILLNESS: This is a 77 year old male who is new to our office.  He has been referred here by Dr. Nadene Rubins, his PCP, for history of colon polyps and to discuss colonoscopy.  He has a history of a colon polyp in the cecum that required right colon resection several years ago.  Last colonoscopy in February 2019 as below.  Colonoscopy 01/2018:  - Diverticulosis in the sigmoid colon. - Three 3 to 4 mm polyps in the sigmoid colon and in the transverse colon, removed with a jumbo cold forceps. Resected and retrieved. - Non- bleeding internal hemorrhoids. - The examination was otherwise normal.  DIAGNOSIS:  A. COLON POLYP 2, TRANSVERSE; COLD BIOPSY:  - TUBULAR ADENOMA (2).  - NEGATIVE FOR HIGH-GRADE DYSPLASIA AND MALIGNANCY.   B. COLON POLYP, SIGMOID; COLD BIOPSY:  - POLYPOID FRAGMENT OF COLONIC MUCOSA WITH FOCAL HYPERPLASTIC EPITHELIAL  CHANGE.  - NEGATIVE FOR DYSPLASIA AND MALIGNANCY.   Last colonoscopy was performed by Dr. Servando Snare in [place].  Patient now resides in [place].  He denies any GI complaints.  Moves his bowels regularly.  No rectal bleeding.  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   Arthritis    Colon polyp    Coronary artery disease    Diverticula of colon    Dysrhythmia    PACs, PVCs   Heart disease    High blood pressure    High cholesterol    Hyperlipemia    Peripheral vascular disease (HCC)    Prostate cancer [facility])    Past Surgical History:  Procedure Laterality Date   ABDOMINAL AORTOGRAM W/LOWER EXTREMITY N/A 10/16/2017   Procedure: ABDOMINAL AORTOGRAM W/LOWER EXTREMITY;  Surgeon: Sherren Kerns, MD;  [facility] INV[facility] CV LAB;  Service: Cardiovascular;  Laterality: N/A;   APPENDECTOMY     CARDIAC CATHETERIZATION N/A 11/13/2015   Procedure: Left Heart Cath and Coronary Angiography;  Surgeon: Dalia Heading, MD;  Location: [facility] INVASIVE  CV LAB;  Service: Cardiovascular;  Laterality: N/A;   CATARACT EXTRACTION     COLON SURGERY  2008   right colon resection   COLONOSCOPY WITH PROPOFOL N/A 01/06/2018   Procedure: COLONOSCOPY WITH PROPOFOL;  Surgeon: Toledo, Boykin Nearing, MD;  Location: [facility] ENDOSCOPY;  Service: Gastroenterology;  Laterality: N/A;   ENDARTERECTOMY POPLITEAL Right 11/11/2017   FEMORAL-POPLITEAL BYPASS GRAFT Right 11/11/2017   Procedure: RIGHT POPLITEAL ENDARTERECTOMY;  Surgeon: Sherren Kerns, MD;  Location: [facility] OR;  Service: Vascular;  Laterality: Right;   LIPOMA EXCISION Right    thigh   METACARPOPHALANGEAL JOINT ARTHROPLASTY     PATCH ANGIOPLASTY Right 11/11/2017   Procedure: PATCH ANGIOPLASTY of Superficial Fenoral Artery;  Surgeon: Sherren Kerns, MD;  Location: [facility] OR;  Service: Vascular;  Laterality: Right;   PROSTATECTOMY  2003   ULNAR NERVE TRANSPOSITION Left January 2015    reports that he quit smoking about 39 years ago. His smoking use included cigarettes. He started smoking about 44 years ago. He has a 7.5 pack-year smoking history. He has never been exposed to tobacco smoke. He has never used smokeless tobacco. He reports that he does not drink alcohol and does not use drugs. family history includes Breast cancer in his mother; COPD in his mother; Healthy in his brother, daughter, and son; Liver disease in his mother; Throat cancer in his father. Allergies  Allergen Reactions   Pravastatin Sodium Other (  See Comments)    Muscle aches    Amoxicillin Hives and Rash    Has patient had a PCN reaction causing immediate rash, facial/tongue/throat swelling, SOB or lightheadedness with hypotension: Yes Has patient had a PCN reaction causing severe rash involving mucus membranes or skin necrosis: No Has patient had a PCN reaction that required hospitalization: No Has patient had a PCN reaction occurring within the last 10 years: No If all of the above answers are "NO", then may proceed with Cephalosporin  use.    Hepatitis A Virus Vaccine Inactivated Rash   Typhoid Vaccines Rash      Outpatient Encounter Medications as of 10/19/2023  Medication Sig   amLODipine-olmesartan (AZOR) 5-20 MG tablet Take 1 tablet by mouth daily.   aspirin EC 81 MG tablet Take 81 mg daily by mouth.    cholecalciferol (VITAMIN D) 1000 units tablet Take 1,000 Units daily by mouth.   ezetimibe (ZETIA) 10 MG tablet Take 1 tablet (10 mg total) by mouth daily.   MAGNESIUM-OXIDE 400 (241.3 Mg) MG tablet Take 400 mg by mouth daily.   Multiple Vitamins-Minerals (ICAPS AREDS 2 PO) Take 2 capsules by mouth daily.   rosuvastatin (CRESTOR) 5 MG tablet Take 1 tablet (5 mg total) by mouth daily.   vitamin B-12 (CYANOCOBALAMIN) 1000 MCG tablet Take 1,000 mcg by mouth daily. Reported on 12/31/2015   [DISCONTINUED] olmesartan (BENICAR) 20 MG tablet Take 1 tablet (20 mg total) by mouth daily.   No facility-administered encounter medications on file as of 10/19/2023.    REVIEW OF SYSTEMS  : All other systems reviewed and negative except where noted in the History of Present Illness.   PHYSICAL EXAM: BP 112/62   Pulse 64   Ht 5\' 9"  (1.753 m)   Wt 201 lb (91.2 kg)   BMI 29.68 kg/m  General: Well developed white male in no acute distress Head: Normocephalic and atraumatic Eyes:  Sclerae anicteric, conjunctiva pink. Ears: Normal auditory acuity Lungs: Clear throughout to auscultation; no W/R/R. Heart: Regular rate and rhythm; no M/R/G. Rectal:  Will be done at the time of colonoscopy. Musculoskeletal: Symmetrical with no gross deformities  Skin: No lesions on visible extremities Extremities: No edema  Neurological: Alert oriented x 4, grossly non-focal Psychological:  Alert and cooperative. Normal mood and affect  ASSESSMENT AND PLAN: *Personal history of colon polyps: Patient has a history of tubular adenomas and had a cecal adenoma requiring a right hemicolectomy several years ago.  Last colonoscopy was February 2019  in [place], [place].  Patient now relocated to it and living in this area.  Will schedule for colonoscopy with Dr. Myrtie Neither.  The risks, benefits, and alternatives to colonoscopy were discussed with the patient and he consents to proceed.   CC:  Melida Quitter, MD

## 2023-10-20 NOTE — Progress Notes (Signed)
____________________________________________________________  Attending physician addendum:  Thank you for sending this case to me. I have reviewed the entire note and agree with the plan.   Malyssa Maris Danis, MD  ____________________________________________________________  

## 2023-10-21 ENCOUNTER — Encounter: Payer: Medicare Other | Admitting: Physical Therapy

## 2023-10-26 ENCOUNTER — Encounter: Payer: Medicare Other | Admitting: Physical Therapy

## 2023-11-06 ENCOUNTER — Telehealth: Payer: Self-pay | Admitting: Cardiology

## 2023-11-06 DIAGNOSIS — R0602 Shortness of breath: Secondary | ICD-10-CM

## 2023-11-06 NOTE — Telephone Encounter (Signed)
Pt c/o Shortness Of Breath: STAT if SOB developed within the last 24 hours or pt is noticeably SOB on the phone  1. Are you currently SOB (can you hear that pt is SOB on the phone)?  No   2. How long have you been experiencing SOB?  Past few weeks   3. Are you SOB when sitting or when up moving around?  When up and moving   4. Are you currently experiencing any other symptoms?  No

## 2023-11-06 NOTE — Telephone Encounter (Signed)
Spoke with pt about him having SOB. Pt stated it is only when he is active/ doing light exercise, doesn't occur at rest, no CP, swelling, palpitations. Pt told the message would be forwarded to Dr. Anne Fu nurse to see if changes needed to be made. Pt verbalized understanding and had no further questions at this time.

## 2023-11-09 ENCOUNTER — Encounter: Payer: Self-pay | Admitting: *Deleted

## 2023-11-09 ENCOUNTER — Telehealth: Payer: Self-pay | Admitting: Cardiology

## 2023-11-09 NOTE — Telephone Encounter (Signed)
Let's order ECHO Lexiscan stress  Prior CABG, with symptoms SOB. Donato Schultz, MD    Pt is aware of the above orders and that he will be contacted to be scheduled.  He is also aware he will receive instructions via MyChart.  He will call back if any questions, concerns or changes in s/s prior to his testing.

## 2023-11-09 NOTE — Telephone Encounter (Signed)
This has been addressed in another encounter.  Please see notes below and that encounter dated 11/06/2023 for complete details.  Sharin Grave, RN    11/09/23  1:11 PM Note Addended by: Sharin Grave on: 11/09/2023 01:11 PM     Modules accepted: Orders      Sharin Grave, RN      11/09/23  1:10 PM Note Let's order ECHO Lexiscan stress  Prior CABG, with symptoms SOB. Donato Schultz, MD      Pt is aware of the above orders and that he will be contacted to be scheduled.  He is also aware he will receive instructions via MyChart.  He will call back if any questions, concerns or changes in s/s prior to his testing.           Sharin Grave, RN routed this conversation to Jake Bathe, MD  November 06, 2023  Jake Bathe, MD  to Sharin Grave, RN   Let's order ECHO Lexiscan stress  Prior CABG, with symptoms SOB. Donato Schultz, MD      11/06/23  4:23 PM Sharin Grave, RN routed this conversation to Jake Bathe, MD  Sharin Grave, RN   [place], RN  to Sharin Grave, RN   Pt states the SOB is only when he is doing light exercise/ moving around, no swelling, no CP, no palpitations. His only concern is that was the only symptom he had prior to bypass surgery he had a few years back.    Note Spoke with pt about him having SOB. Pt stated it is only when he is active/ doing light exercise, doesn't occur at rest, no CP, swelling, palpitations. Pt told the message would be forwarded to Dr. Anne Fu nurse to see if changes needed to be made. Pt verbalized understanding and had no further questions at this time.

## 2023-11-09 NOTE — Addendum Note (Signed)
Addended by: Jake Bathe on: 11/09/2023 09:36 PM   Modules accepted: Orders

## 2023-11-09 NOTE — Addendum Note (Signed)
Addended by: Sharin Grave on: 11/09/2023 01:11 PM   Modules accepted: Orders

## 2023-11-09 NOTE — Telephone Encounter (Signed)
  Per MyChart scheduling message:  Pt c/o Shortness Of Breath: STAT if SOB developed within the last 24 hours or pt is noticeably SOB on the phone  1. Are you currently SOB (can you hear that pt is SOB on the phone)?   2. How long have you been experiencing SOB?   3. Are you SOB when sitting or when up moving around?   4. Are you currently experiencing any other symptoms?    Lately I have found myself short of breath on light exertion. This is unusual for me. This has been going on for several weeks.

## 2023-11-16 ENCOUNTER — Ambulatory Visit (HOSPITAL_COMMUNITY): Payer: Medicare Other | Attending: Cardiology

## 2023-11-16 DIAGNOSIS — R0602 Shortness of breath: Secondary | ICD-10-CM | POA: Diagnosis present

## 2023-11-16 LAB — MYOCARDIAL PERFUSION IMAGING
LV dias vol: 106 mL (ref 62–150)
LV sys vol: 39 mL
Nuc Stress EF: 63 %
Peak HR: 78 {beats}/min
Rest HR: 51 {beats}/min
Rest Nuclear Isotope Dose: 10.8 mCi
SDS: 1
SRS: 0
SSS: 1
ST Depression (mm): 0 mm
Stress Nuclear Isotope Dose: 31.2 mCi
TID: 0.99

## 2023-11-16 MED ORDER — TECHNETIUM TC 99M TETROFOSMIN IV KIT
31.2000 | PACK | Freq: Once | INTRAVENOUS | Status: AC | PRN
Start: 2023-11-16 — End: 2023-11-16
  Administered 2023-11-16: 31.2 via INTRAVENOUS

## 2023-11-16 MED ORDER — TECHNETIUM TC 99M TETROFOSMIN IV KIT
10.8000 | PACK | Freq: Once | INTRAVENOUS | Status: AC | PRN
  Administered 2023-11-16: 10.8 via INTRAVENOUS

## 2023-11-16 MED ORDER — REGADENOSON 0.4 MG/5ML IV SOLN
0.4000 mg | Freq: Once | INTRAVENOUS | Status: AC
  Administered 2023-11-16: 0.4 mg via INTRAVENOUS

## 2023-11-17 ENCOUNTER — Ambulatory Visit (HOSPITAL_COMMUNITY): Payer: Medicare Other

## 2023-11-17 ENCOUNTER — Encounter (HOSPITAL_COMMUNITY): Payer: Self-pay

## 2023-11-17 NOTE — Telephone Encounter (Signed)
Pt has lexiscan 12/16 and is scheduled for echo 12/15/23.

## 2023-12-13 ENCOUNTER — Encounter: Payer: Self-pay | Admitting: Certified Registered Nurse Anesthetist

## 2023-12-14 ENCOUNTER — Encounter: Payer: Self-pay | Admitting: Gastroenterology

## 2023-12-14 ENCOUNTER — Ambulatory Visit (AMBULATORY_SURGERY_CENTER): Payer: Medicare Other | Admitting: Gastroenterology

## 2023-12-14 VITALS — BP 101/49 | HR 62 | Temp 97.2°F | Resp 11 | Ht 69.0 in | Wt 201.0 lb

## 2023-12-14 DIAGNOSIS — Z1211 Encounter for screening for malignant neoplasm of colon: Secondary | ICD-10-CM | POA: Diagnosis not present

## 2023-12-14 DIAGNOSIS — D123 Benign neoplasm of transverse colon: Secondary | ICD-10-CM | POA: Diagnosis not present

## 2023-12-14 DIAGNOSIS — K5730 Diverticulosis of large intestine without perforation or abscess without bleeding: Secondary | ICD-10-CM | POA: Diagnosis not present

## 2023-12-14 DIAGNOSIS — Z980 Intestinal bypass and anastomosis status: Secondary | ICD-10-CM

## 2023-12-14 DIAGNOSIS — D122 Benign neoplasm of ascending colon: Secondary | ICD-10-CM

## 2023-12-14 DIAGNOSIS — Z860100 Personal history of colon polyps, unspecified: Secondary | ICD-10-CM

## 2023-12-14 DIAGNOSIS — Z860101 Personal history of adenomatous and serrated colon polyps: Secondary | ICD-10-CM

## 2023-12-14 MED ORDER — SODIUM CHLORIDE 0.9 % IV SOLN: 500.0000 mL | INTRAVENOUS | Status: DC

## 2023-12-14 NOTE — Progress Notes (Signed)
 Report given to PACU, vss

## 2023-12-14 NOTE — Progress Notes (Signed)
1115 Ephedrine 10 mg given IV due to low BP, MD updated.  

## 2023-12-14 NOTE — Patient Instructions (Signed)

## 2023-12-14 NOTE — Progress Notes (Signed)
 Called to room to assist during endoscopic procedure.  Patient ID and intended procedure confirmed with present staff. Received instructions for my participation in the procedure from the performing physician.

## 2023-12-14 NOTE — Op Note (Signed)
 [facility] Patient Name: Willie Burton Procedure Date: 12/14/2023 10:37 AM MRN: [id number] Endoscopist: Victory L. Legrand , MD, [id number] Age: 78 Referring MD:  Date of Birth: 06/14/1946 Gender: Male Account #: [id number] Procedure:                Colonoscopy Indications:              Surveillance: Personal history of adenomatous                            polyps on last colonoscopy > 5 years ago                           Two diminutive tubular adenomas Feb 2019 (outside                            practice) Medicines:                Monitored Anesthesia Care Procedure:                Pre-Anesthesia Assessment:                           - Prior to the procedure, a History and Physical                            was performed, and patient medications and                            allergies were reviewed. The patient's tolerance of                            previous anesthesia was also reviewed. The risks                            and benefits of the procedure and the sedation                            options and risks were discussed with the patient.                            All questions were answered, and informed consent                            was obtained. Prior Anticoagulants: The patient has                            taken no anticoagulant or antiplatelet agents. ASA                            Grade Assessment: III - A patient with severe                            systemic disease. After reviewing the risks and  benefits, the patient was deemed in satisfactory                            condition to undergo the procedure.                           After obtaining informed consent, the colonoscope                            was passed under direct vision. Throughout the                            procedure, the patient's blood pressure, pulse, and                            oxygen saturations were monitored continuously. The                             CF [id number] #[id number] was introduced through the anus                            and advanced to the the terminal ileum. The                            colonoscopy was performed with difficulty due to a                            redundant sigmoid colon and significant looping.                            Successful completion of the procedure was aided by                            changing the patient to a semi-prone position,                            using manual pressure and straightening and                            shortening the scope to obtain bowel loop                            reduction. The patient tolerated the procedure                            well. The quality of the bowel preparation was                            good. The terminal ileum, the rectum and                            ileo-colonic anastomosis were photographed. Scope In: 10:59:20 AM Scope Out: 11:23:34 AM Scope Withdrawal Time: 0 hours 15 minutes 33 seconds  Total Procedure  Duration: 0 hours 24 minutes 14 seconds  Findings:                 The perianal and digital rectal examinations were                            normal.                           The terminal ileum appeared normal.                           There was evidence of a prior end-to-side                            ileo-colonic anastomosis in the mid ascending                            colon. This was patent and was characterized by                            healthy appearing mucosa. (prior cecectomy)                           Repeat examination of right colon under NBI                            performed.                           Four sessile polyps were found in the proximal                            transverse colon and ascending colon. The polyps                            were 4 to 8 mm in size. These polyps were removed                            with a cold snare. Resection and retrieval were                             complete.                           A 10 mm polyp was found in the distal transverse                            colon. The polyp was sessile. The polyp was removed                            with a cold snare. Resection and retrieval were                            complete.  Diverticula were found in the left colon.                           The exam was otherwise without abnormality on                            direct and retroflexion views. Complications:            No immediate complications. Estimated Blood Loss:     Estimated blood loss was minimal. Impression:               - The examined portion of the ileum was normal.                           - Patent end-to-side ileo-colonic anastomosis,                            characterized by healthy appearing mucosa.                           - Four 4 to 8 mm polyps in the proximal transverse                            colon and in the ascending colon, removed with a                            cold snare. Resected and retrieved.                           - One 10 mm polyp in the distal transverse colon,                            removed with a cold snare. Resected and retrieved.                           - Diverticulosis in the left colon.                           - The examination was otherwise normal on direct                            and retroflexion views. Recommendation:           - Patient has a contact number available for                            emergencies. The signs and symptoms of potential                            delayed complications were discussed with the                            patient. Return to normal activities tomorrow.  Written discharge instructions were provided to the                            patient.                           - Resume previous diet.                           - Continue present medications.                           - Await  pathology results.                           - Repeat colonoscopy is recommended for                            surveillance. The colonoscopy date will be                            determined after pathology results from today's                            exam become available for review. ( 3 years if most                            or all polyps are adenomatous). Laquanda Bick L. Legrand, MD 12/14/2023 11:33:25 AM This report has been signed electronically.

## 2023-12-14 NOTE — Progress Notes (Signed)
 History and Physical:  This patient presents for endoscopic testing for: Encounter Diagnosis  Name Primary?   Hx of colonic polyps Yes    Clinical details in 10/19/23 office consult note.  Surveillance colonoscopy for Hx colon poylps.  Diminutive TA x 2 in Feb 2019 at outside GI practice Patient denies chronic abdominal pain, rectal bleeding, constipation or diarrhea.   Patient is otherwise without complaints or active issues today.   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   Arthritis    Colon polyp    Coronary artery disease    Diverticula of colon    Dysrhythmia    PACs, PVCs   Heart disease    High blood pressure    High cholesterol    Hyperlipemia    Peripheral vascular disease (HCC)    Prostate cancer [facility])      Past Surgical History: Past Surgical History:  Procedure Laterality Date   ABDOMINAL AORTOGRAM W/LOWER EXTREMITY N/A 10/16/2017   Procedure: ABDOMINAL AORTOGRAM W/LOWER EXTREMITY;  Surgeon: Harvey Carlin BRAVO, MD;  Location: [facility]ASIVE [facility];  Service: Cardiovascular;  Laterality: N/A;   APPENDECTOMY     CARDIAC CATHETERIZATION N/A 11/13/2015   Procedure: Left Heart Cath and Coronary Angiography;  Surgeon: Vinie DELENA Jude, MD;  Location: [facility] INVASIVE CV LAB;  Service: Cardiovascular;  Laterality: N/A;   CATARACT EXTRACTION     COLON SURGERY  2008   right colon resection   COLONOSCOPY WITH PROPOFOL  N/A 01/06/2018   Procedure: COLONOSCOPY WITH PROPOFOL ;  Surgeon: Toledo, Ladell POUR, MD;  Location: [facility] ENDOSCOPY;  Service: Gastroenterology;  Laterality: N/A;   ENDARTERECTOMY POPLITEAL Right 11/11/2017   FEMORAL-POPLITEAL BYPASS GRAFT Right 11/11/2017   Procedure: RIGHT POPLITEAL ENDARTERECTOMY;  Surgeon: Harvey Carlin BRAVO, MD;  Location: [facility] OR;  Service: Vascular;  Laterality: Right;   LIPOMA EXCISION Right    thigh   METACARPOPHALANGEAL JOINT ARTHROPLASTY     PATCH ANGIOPLASTY Right  11/11/2017   Procedure: PATCH ANGIOPLASTY of Superficial Fenoral Artery;  Surgeon: Harvey Carlin BRAVO, MD;  Location: [facility] OR;  Service: Vascular;  Laterality: Right;   PROSTATECTOMY  2003   ULNAR NERVE TRANSPOSITION Left January 2015    Allergies: Allergies  Allergen Reactions   Pravastatin Sodium Other (See Comments)    Muscle aches    Amoxicillin Hives and Rash    Has patient had a PCN reaction causing immediate rash, facial/tongue/throat swelling, SOB or lightheadedness with hypotension: Yes Has patient had a PCN reaction causing severe rash involving mucus membranes or skin necrosis: No Has patient had a PCN reaction that required hospitalization: No Has patient had a PCN reaction occurring within the last 10 years: No If all of the above answers are NO, then may proceed with Cephalosporin use.    Hepatitis A Virus Vaccine Inactivated Rash   Typhoid Vaccines Rash    Outpatient Meds: Current Outpatient Medications  Medication Sig Dispense Refill   amLODipine -olmesartan  (AZOR ) 5-20 MG tablet Take 1 tablet by mouth daily. 90 tablet 3   aspirin  EC 81 MG tablet Take 81 mg daily by mouth.      cholecalciferol  (VITAMIN D ) 1000 units tablet Take 1,000 Units daily by mouth.     ezetimibe  (ZETIA ) 10 MG tablet Take 1 tablet (10 mg total) by mouth daily. 90 tablet 2   MAGNESIUM -OXIDE 400 (241.3 Mg) MG tablet Take 400 mg by mouth daily.  11   Multiple Vitamins-Minerals (ICAPS AREDS 2  PO) Take 2 capsules by mouth daily.     rosuvastatin  (CRESTOR ) 5 MG tablet Take 1 tablet (5 mg total) by mouth daily. 90 tablet 3   vitamin B-12 (CYANOCOBALAMIN ) 1000 MCG tablet Take 1,000 mcg by mouth daily. Reported on 12/31/2015     No current facility-administered medications for this visit.      ___________________________________________________________________ Objective   Exam:  BP (!) 129/58 (Cuff Size: Normal)   Pulse 63   Temp (!) 97.2 F (36.2 C) (Skin)   Ht 5' 9 (1.753 m)   Wt 201 lb  (91.2 kg)   BMI 29.68 kg/m   CV: regular , S1/S2 Resp: clear to auscultation bilaterally, normal RR and effort noted GI: soft, no tenderness, with active bowel sounds.   Assessment: Encounter Diagnosis  Name Primary?   Hx of colonic polyps Yes     Plan: Colonoscopy   The benefits and risks of the planned procedure were described in detail with the patient or (when appropriate) their health care proxy.  Risks were outlined as including, but not limited to, bleeding, infection, perforation, adverse medication reaction leading to cardiac or pulmonary decompensation, pancreatitis (if ERCP).  The limitation of incomplete mucosal visualization was also discussed.  No guarantees or warranties were given.  The patient is appropriate for an endoscopic procedure in the ambulatory setting.   - Victory Brand, MD

## 2023-12-15 ENCOUNTER — Telehealth: Payer: Self-pay

## 2023-12-15 ENCOUNTER — Ambulatory Visit (HOSPITAL_COMMUNITY): Payer: Medicare Other | Attending: Cardiology

## 2023-12-15 DIAGNOSIS — R0602 Shortness of breath: Secondary | ICD-10-CM | POA: Diagnosis present

## 2023-12-15 LAB — ECHOCARDIOGRAM COMPLETE
Area-P 1/2: 4.21 cm2
S' Lateral: 3 cm

## 2023-12-15 NOTE — Telephone Encounter (Signed)
  Follow up Call-     12/14/2023   10:40 AM  Call back number  Post procedure Call Back phone  # [phone]  Permission to leave phone message Yes     Patient questions:  Do you have a fever, pain , or abdominal swelling? No. Pain Score  0 *  Have you tolerated food without any problems? Yes.    Have you been able to return to your normal activities? Yes.    Do you have any questions about your discharge instructions: Diet   No. Medications  No. Follow up visit  No.  Do you have questions or concerns about your Care? No.  Actions: * If pain score is 4 or above: No action needed, pain <4.

## 2023-12-16 ENCOUNTER — Encounter: Payer: Self-pay | Admitting: Gastroenterology

## 2023-12-16 LAB — SURGICAL PATHOLOGY

## 2023-12-17 ENCOUNTER — Encounter: Payer: Self-pay | Admitting: Cardiology

## 2024-02-01 ENCOUNTER — Other Ambulatory Visit (HOSPITAL_BASED_OUTPATIENT_CLINIC_OR_DEPARTMENT_OTHER): Payer: Self-pay

## 2024-02-01 DIAGNOSIS — R0609 Other forms of dyspnea: Secondary | ICD-10-CM

## 2024-02-02 ENCOUNTER — Ambulatory Visit (HOSPITAL_BASED_OUTPATIENT_CLINIC_OR_DEPARTMENT_OTHER): Payer: Medicare Other | Admitting: Pulmonary Disease

## 2024-02-02 DIAGNOSIS — R0609 Other forms of dyspnea: Secondary | ICD-10-CM | POA: Diagnosis not present

## 2024-02-02 LAB — PULMONARY FUNCTION TEST
DL/VA % pred: 86 %
DL/VA: 3.44 ml/min/mmHg/L
DLCO cor % pred: 86 %
DLCO cor: 20.61 ml/min/mmHg
DLCO unc % pred: 86 %
DLCO unc: 20.61 ml/min/mmHg
FEF 25-75 Post: 2.42 L/s
FEF 25-75 Pre: 2.45 L/s
FEF2575-%Change-Post: -1 %
FEF2575-%Pred-Post: 118 %
FEF2575-%Pred-Pre: 120 %
FEV1-%Change-Post: 0 %
FEV1-%Pred-Post: 116 %
FEV1-%Pred-Pre: 115 %
FEV1-Post: 3.3 L
FEV1-Pre: 3.28 L
FEV1FVC-%Change-Post: 1 %
FEV1FVC-%Pred-Pre: 102 %
FEV6-%Change-Post: 0 %
FEV6-%Pred-Post: 117 %
FEV6-%Pred-Pre: 117 %
FEV6-Post: 4.33 L
FEV6-Pre: 4.35 L
FEV6FVC-%Change-Post: 0 %
FEV6FVC-%Pred-Post: 105 %
FEV6FVC-%Pred-Pre: 106 %
FVC-%Change-Post: 0 %
FVC-%Pred-Post: 111 %
FVC-%Pred-Pre: 111 %
FVC-Post: 4.39 L
FVC-Pre: 4.42 L
Post FEV1/FVC ratio: 75 %
Post FEV6/FVC ratio: 99 %
Pre FEV1/FVC ratio: 74 %
Pre FEV6/FVC Ratio: 99 %
RV % pred: 109 %
RV: 2.74 L
TLC % pred: 104 %
TLC: 7.03 L

## 2024-02-02 NOTE — Progress Notes (Signed)
 Full pft performed today.

## 2024-02-02 NOTE — Patient Instructions (Signed)
 Full pft performed today.

## 2024-02-04 ENCOUNTER — Telehealth (HOSPITAL_COMMUNITY): Payer: Self-pay

## 2024-02-04 NOTE — Telephone Encounter (Signed)
 Attempted to contact the patient to schedule VAS [place]. No answer. Left message. First Attempt Provided direct contact number for scheduling: [phone].

## 2024-02-08 ENCOUNTER — Ambulatory Visit (HOSPITAL_COMMUNITY)
Admission: RE | Admit: 2024-02-08 | Discharge: 2024-02-08 | Disposition: A | Discharge status: Home / Self Care | Source: Ambulatory Visit | Attending: Surgery | Admitting: Surgery

## 2024-02-08 ENCOUNTER — Other Ambulatory Visit (HOSPITAL_COMMUNITY): Payer: Self-pay | Admitting: Internal Medicine

## 2024-02-08 DIAGNOSIS — I779 Disorder of arteries and arterioles, unspecified: Secondary | ICD-10-CM | POA: Diagnosis not present

## 2024-02-23 ENCOUNTER — Ambulatory Visit: Payer: Medicare Other | Admitting: Pulmonary Disease

## 2024-02-23 ENCOUNTER — Encounter: Payer: Self-pay | Admitting: Pulmonary Disease

## 2024-02-23 ENCOUNTER — Encounter (HOSPITAL_BASED_OUTPATIENT_CLINIC_OR_DEPARTMENT_OTHER): Payer: Medicare Other

## 2024-02-23 VITALS — BP 147/69 | HR 77 | Ht 69.0 in | Wt 208.8 lb

## 2024-02-23 DIAGNOSIS — Z87891 Personal history of nicotine dependence: Secondary | ICD-10-CM

## 2024-02-23 DIAGNOSIS — R0683 Snoring: Secondary | ICD-10-CM | POA: Diagnosis not present

## 2024-02-23 DIAGNOSIS — R0609 Other forms of dyspnea: Secondary | ICD-10-CM

## 2024-02-23 NOTE — Progress Notes (Unsigned)
 Synopsis: Referred in March 2025 for PFT evaluation  Subjective:   PATIENT ID: Willie Burton GENDER: male DOB: 1946/02/23, MRN: [id number]   HPI  Chief Complaint  Patient presents with   Consult    Pt states about 6 month ago he started having SOB w/ daily actives etc., no sleep issues    Willie Burton is a 78 year old male former smoker with history of hypertension and prostate cancer who is referred to pulmonary clinic for shortness of breath.  He has been experiencing shortness of breath for approximately six months, primarily during exertion such as working around the house, yard, and walking. This symptom is unusual for him, as he previously engaged in regular workouts without issues. No cough, wheezing, or chest tightness accompanies the shortness of breath. He does not experience shortness of breath when lying flat or during sleep, and there is no swelling in the legs. He also does not experience shortness of breath when walking from the parking lot to the clinic.  He has a history of coronary artery disease and underwent bypass surgery in 2016. He initially reported the symptom of shortness of breath to his cardiologist, as it was the only symptom he had before his bypass surgery. His last cardiac ultrasound in January showed normal results. He has undergone breathing tests earlier this month, which were overall normal, with no significant response to albuterol or bronchodilator challenge.  He has been experiencing hip pain for about six months, which has limited his physical activity. The decrease in activity began about five months before the onset of shortness of breath. He has recently started walking again and hopes to resume using the treadmill soon. No significant weight changes have been noted.  He has a significant smoking history, having smoked a pack and a half a day for about ten years, but quit nearly fifty years ago. He is retired and previously worked as an Art gallery manager,  with occasional exposure to dust and chemicals. He denies any current hobbies involving dust or chemical exposure.  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   Arthritis    Colon polyp    Coronary artery disease    Diverticula of colon    Dysrhythmia    PACs, PVCs   Heart disease    High blood pressure    High cholesterol    Hyperlipemia    Peripheral vascular disease (HCC)    Prostate cancer (HCC)      Family History  Problem Relation Age of Onset   COPD Mother        Deceased, 32   Breast cancer Mother    Liver disease Mother    Esophageal cancer Father    Throat cancer Father        Deceased, 6s   Healthy Brother    Healthy Daughter    Healthy Son    Colon cancer Neg Hx      Social History   Socioeconomic History   Marital status: Married    Spouse name: Not on file   Number of children: 2   Years of education: Masters   Highest education level: Not on file  Occupational History   Occupation: retired  Tobacco Use   Smoking status: Former    Current packs/day: 0.00    Average packs/day: 1.5 packs/day for 5.0 years (7.5 ttl pk-yrs)    Types: Cigarettes    Start date: 1980    Quit  date: 11    Years since quitting: 40.2    Passive exposure: Never   Smokeless tobacco: Never   Tobacco comments:    Quit 40+ years ago  Vaping Use   Vaping status: Never Used  Substance and Sexual Activity   Alcohol use: No    Alcohol/week: 0.0 standard drinks of alcohol   Drug use: No   Sexual activity: Not on file  Other Topics Concern   Not on file  Social History Narrative   He lives with his wife.   Retired Art gallery manager.   Highest level of education:  Teacher, early years/pre    Right-handed.   1-2 cups caffeine per day.   Social Drivers of Corporate investment banker Strain: Not on file  Food Insecurity: Not on file  Transportation Needs: Not on file  Physical Activity: Not on file  Stress: Not on file  Social  Connections: Not on file  Intimate Partner Violence: Not on file     Allergies  Allergen Reactions   Pravastatin Sodium Other (See Comments)    Muscle aches    Amoxicillin Hives and Rash    Has patient had a PCN reaction causing immediate rash, facial/tongue/throat swelling, SOB or lightheadedness with hypotension: Yes Has patient had a PCN reaction causing severe rash involving mucus membranes or skin necrosis: No Has patient had a PCN reaction that required hospitalization: No Has patient had a PCN reaction occurring within the last 10 years: No If all of the above answers are "NO", then may proceed with Cephalosporin use.    Hepatitis A Virus Vaccine Inactivated Rash   Typhoid Vaccines Rash     Outpatient Medications Prior to Visit  Medication Sig Dispense Refill   amLODipine-olmesartan (AZOR) 5-20 MG tablet Take 1 tablet by mouth daily. 90 tablet 3   aspirin EC 81 MG tablet Take 81 mg daily by mouth.      cholecalciferol (VITAMIN D) 1000 units tablet Take 1,000 Units daily by mouth.     ezetimibe (ZETIA) 10 MG tablet Take 1 tablet (10 mg total) by mouth daily. 90 tablet 2   MAGNESIUM-OXIDE 400 (241.3 Mg) MG tablet Take 400 mg by mouth daily.  11   Multiple Vitamins-Minerals (ICAPS AREDS 2 PO) Take 2 capsules by mouth daily.     rosuvastatin (CRESTOR) 5 MG tablet Take 1 tablet (5 mg total) by mouth daily. 90 tablet 3   vitamin B-12 (CYANOCOBALAMIN) 1000 MCG tablet Take 1,000 mcg by mouth daily. Reported on 12/31/2015     No facility-administered medications prior to visit.    Review of Systems  Constitutional:  Negative for chills, fever, malaise/fatigue and weight loss.  HENT:  Negative for congestion, sinus pain and sore throat.   Eyes: Negative.   Respiratory:  Positive for shortness of breath. Negative for cough, hemoptysis, sputum production and wheezing.   Cardiovascular:  Negative for chest pain, palpitations, orthopnea, claudication and leg swelling.   Gastrointestinal:  Negative for abdominal pain, heartburn, nausea and vomiting.  Genitourinary: Negative.   Musculoskeletal:  Negative for joint pain and myalgias.  Skin:  Negative for rash.  Neurological:  Negative for weakness.  Endo/Heme/Allergies: Negative.   Psychiatric/Behavioral: Negative.     Objective:   Vitals:   02/23/24 1326  BP: (!) 147/69  Pulse: 77  SpO2: 100%  Weight: 208 lb 12.8 oz (94.7 kg)  Height: 5\' 9"  (1.753 m)   Physical Exam Constitutional:      General: He is not in acute distress.  Appearance: Normal appearance.  Eyes:     General: No scleral icterus.    Conjunctiva/sclera: Conjunctivae normal.  Cardiovascular:     Rate and Rhythm: Normal rate and regular rhythm.  Pulmonary:     Breath sounds: No wheezing, rhonchi or rales.  Musculoskeletal:     Right lower leg: No edema.     Left lower leg: No edema.  Skin:    General: Skin is warm and dry.  Neurological:     General: No focal deficit present.    CBC    Component Value Date/Time   WBC 4.4 09/10/2021 0000   WBC 8.8 11/12/2017 0258   RBC 4.16 (L) 11/12/2017 0258   HGB 14.7 09/10/2021 0000   HCT 45 09/10/2021 0000   PLT 206 09/10/2021 0000   MCV 93.0 11/12/2017 0258   MCH 31.5 11/12/2017 0258   MCHC 33.9 11/12/2017 0258   RDW 13.0 11/12/2017 0258      Latest Ref Rng & Units 11/28/2022   10:36 AM 09/10/2021   12:00 AM 11/12/2017    2:58 AM  BMP  Glucose 70 - 99 mg/dL 92   409   BUN 8 - 27 mg/dL 20  17     14    Creatinine 0.76 - 1.27 mg/dL 8.11  0.8     9.14   BUN/Creat Ratio 10 - 24 16     Sodium 134 - 144 mmol/L 135  137     136   Potassium 3.5 - 5.2 mmol/L 4.7  5.0     4.2   Chloride 96 - 106 mmol/L 98  103     106   CO2 20 - 29 mmol/L 24  29     25    Calcium 8.6 - 10.2 mg/dL 9.3  9.2     8.4      This result is from an external source.    Chest imaging:  PFT:    Latest Ref Rng & Units 02/02/2024    8:17 AM  PFT Results  FVC-Pre L 4.42  P  FVC-Predicted Pre %  111  P  FVC-Post L 4.39  P  FVC-Predicted Post % 111  P  Pre FEV1/FVC % % 74  P  Post FEV1/FCV % % 75  P  FEV1-Pre L 3.28  P  FEV1-Predicted Pre % 115  P  FEV1-Post L 3.30  P  DLCO uncorrected ml/min/mmHg 20.61  P  DLCO UNC% % 86  P  DLCO corrected ml/min/mmHg 20.61  P  DLCO COR %Predicted % 86  P  DLVA Predicted % 86  P  TLC L 7.03  P  TLC % Predicted % 104  P  RV % Predicted % 109  P    P Preliminary result  PFTs 02/02/24: Within normal limits  Labs:  Path:  Echo 12/15/23: LV EF 60-65%. RV size and systolic function are normal.   Heart Catheterization:     Assessment & Plan:   Dyspnea on exertion  Discussion: Willie Burton is a 78 year old male former smoker with history of hypertension and prostate cancer who is referred to pulmonary clinic for shortness of breath.  Exertional Dyspnea Exertional dyspnea with normal cardiac and pulmonary function tests. Possible deconditioning. Past smoking history noted. - Encourage increased physical activity. - Consider inhaler trial if symptoms persist. - Re-evaluate in 3-4 months if symptoms do not improve. - Consider cardiopulmonary exercise test if symptoms persist after increased activity.  Sleep Apnea (suspected) Suspected sleep apnea  due to snoring, potential contributor to dyspnea. - Consider home sleep study if exertional dyspnea persists and other causes are ruled out.  Hip Pain Hip pain due to tight glute muscle, improved with massage and trigger point injections. - Continue current treatment for hip pain as needed. - Encourage gradual increase in physical activity.  Follow up as needed  Melody Comas, MD [facility] Office: [phone]   Current Outpatient Medications:    amLODipine-olmesartan (AZOR) 5-20 MG tablet, Take 1 tablet by mouth daily., Disp: 90 tablet, Rfl: 3   aspirin EC 81 MG tablet, Take 81 mg daily by mouth. , Disp: , Rfl:    cholecalciferol (VITAMIN D) 1000 units  tablet, Take 1,000 Units daily by mouth., Disp: , Rfl:    ezetimibe (ZETIA) 10 MG tablet, Take 1 tablet (10 mg total) by mouth daily., Disp: 90 tablet, Rfl: 2   MAGNESIUM-OXIDE 400 (241.3 Mg) MG tablet, Take 400 mg by mouth daily., Disp: , Rfl: 11   Multiple Vitamins-Minerals (ICAPS AREDS 2 PO), Take 2 capsules by mouth daily., Disp: , Rfl:    rosuvastatin (CRESTOR) 5 MG tablet, Take 1 tablet (5 mg total) by mouth daily., Disp: 90 tablet, Rfl: 3   vitamin B-12 (CYANOCOBALAMIN) 1000 MCG tablet, Take 1,000 mcg by mouth daily. Reported on 12/31/2015, Disp: , Rfl:

## 2024-02-23 NOTE — Patient Instructions (Signed)
 Your breathing tests are normal  Continue to ramp up your physical activity level  If your breathing does not improved over the next 3-4 months please give [place] a call  Follow up as needed

## 2024-02-25 ENCOUNTER — Encounter: Payer: Self-pay | Admitting: Pulmonary Disease

## 2024-03-15 ENCOUNTER — Ambulatory Visit: Attending: Internal Medicine

## 2024-03-15 DIAGNOSIS — M62838 Other muscle spasm: Secondary | ICD-10-CM | POA: Insufficient documentation

## 2024-03-15 DIAGNOSIS — M25552 Pain in left hip: Secondary | ICD-10-CM | POA: Insufficient documentation

## 2024-03-15 DIAGNOSIS — R252 Cramp and spasm: Secondary | ICD-10-CM | POA: Diagnosis present

## 2024-03-15 DIAGNOSIS — M6281 Muscle weakness (generalized): Secondary | ICD-10-CM | POA: Diagnosis present

## 2024-03-15 DIAGNOSIS — R262 Difficulty in walking, not elsewhere classified: Secondary | ICD-10-CM | POA: Insufficient documentation

## 2024-03-15 NOTE — Therapy (Unsigned)
 OUTPATIENT PHYSICAL THERAPY THORACOLUMBAR EVALUATION   Patient Name: Willie Burton MRN: [id number] DOB:01-Jun-1946, 78 y.o., male Today's Date: 03/16/2024  END OF SESSION:  PT End of Session - 03/16/24 0715     Visit Number 1    Date for PT Re-Evaluation 05/10/24    Authorization Type Medicare    Progress Note Due on Visit 10    PT Start Time 1400    PT Stop Time 1445    PT Time Calculation (min) 45 min    Activity Tolerance Patient tolerated treatment well    Behavior During Therapy WFL for tasks assessed/performed             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   Arthritis    Colon polyp    Coronary artery disease    Diverticula of colon    Dysrhythmia    PACs, PVCs   Heart disease    High blood pressure    High cholesterol    Hyperlipemia    Peripheral vascular disease (HCC)    Prostate cancer (HCC)    Past Surgical History:  Procedure Laterality Date   ABDOMINAL AORTOGRAM W/LOWER EXTREMITY N/A 10/16/2017   Procedure: ABDOMINAL AORTOGRAM W/LOWER EXTREMITY;  Surgeon: Sherren Kerns, MD;  Location: [facility];  Service: Cardiovascular;  Laterality: N/A;   APPENDECTOMY     CARDIAC CATHETERIZATION N/A 11/13/2015   Procedure: Left Heart Cath and Coronary Angiography;  Surgeon: Dalia Heading, MD;  Location: [facility] INVASIVE CV LAB;  Service: Cardiovascular;  Laterality: N/A;   CATARACT EXTRACTION     COLON SURGERY  2008   right colon resection   COLONOSCOPY WITH PROPOFOL N/A 01/06/2018   Procedure: COLONOSCOPY WITH PROPOFOL;  Surgeon: Toledo, Boykin Nearing, MD;  Location: [facility] ENDOSCOPY;  Service: Gastroenterology;  Laterality: N/A;   ENDARTERECTOMY POPLITEAL Right 11/11/2017   FEMORAL-POPLITEAL BYPASS GRAFT Right 11/11/2017   Procedure: RIGHT POPLITEAL ENDARTERECTOMY;  Surgeon: Sherren Kerns, MD;  Location: [facility] OR;  Service: Vascular;  Laterality: Right;   LIPOMA EXCISION Right    thigh    METACARPOPHALANGEAL JOINT ARTHROPLASTY     PATCH ANGIOPLASTY Right 11/11/2017   Procedure: PATCH ANGIOPLASTY of Superficial Fenoral Artery;  Surgeon: Sherren Kerns, MD;  Location: [facility] OR;  Service: Vascular;  Laterality: Right;   PROSTATECTOMY  2003   ULNAR NERVE TRANSPOSITION Left January 2015   Patient Active Problem List   Diagnosis Date Noted   History of colonic polyps 10/19/2023   Primary hypertension 10/17/2021   PAD (peripheral artery disease) (HCC) 10/09/2021   Hyperlipidemia LDL goal <70 10/08/2021   Other microscopic hematuria 10/08/2021   B12 deficiency 06/07/2021   Arthritis of right hand 04/11/2021   Achilles tendonitis, bilateral 03/16/2020   Bilateral Achilles tendonosis 05/16/2019   Claudication of calf muscles (HCC) 08/11/2017   Chronic bilateral low back pain with right-sided sciatica 08/11/2017   Carotid stenosis 05/05/2017   Presbycusis of both ears 03/20/2017   Pulsatile tinnitus of both ears 03/20/2017   H/O coronary artery bypass surgery 11/20/2015   Atherosclerotic heart disease of native coronary artery without angina pectoris 11/15/2015   HNP (herniated nucleus pulposus), lumbar 11/15/2014   Lumbar radiculitis 11/15/2014   Ulnar neuropathy of left upper extremity 10/13/2014   AC (acromioclavicular) arthritis 04/04/2013   CMC arthritis, thumb, degenerative 06/14/2012    PCP: Melida Quitter, MD  REFERRING PROVIDER: Barnabas Harries, MD  REFERRING DIAG: [phone] (ICD-10-CM) -  Spondylosis without myelopathy or radiculopathy, lumbar region  Rationale for Evaluation and Treatment: Rehabilitation  THERAPY DIAG:  Pain in left hip  Muscle weakness (generalized)  Cramp and spasm  Difficulty in walking, not elsewhere classified  Other muscle spasm  ONSET DATE: 03/14/2024  SUBJECTIVE:                                                                                                                                                                                            SUBJECTIVE STATEMENT: Patient reports long hx of left low back, buttock and hip pain.  Nothing going into either leg.  He states he has been to multiple MD's and had PT in the past but never really got rid of the pain in the left buttock area until Dr. Enis Slipper tried the trigger point injections.  He felt this was the first time he finally got relief from the tightness and pain in the low back, hip and buttock.  Dr. Enis Slipper felt PT coupled with DN would be helpful.  He has always been very active, working out in the gym and walking up to 3 miles 3 times per week until this pain prevented him from doing so.  He is retired Art gallery manager.  He hopes to regain ability to be active and workout.    PERTINENT HISTORY:  na  PAIN:  Are you having pain? Yes: NPRS scale: generally 4-5/10 Pain location: left low back Pain description: aching Aggravating factors: standing, bending Relieving factors: rest, meds  PRECAUTIONS: None  RED FLAGS: None   WEIGHT BEARING RESTRICTIONS: No  FALLS:  Has patient fallen in last 6 months? No  LIVING ENVIRONMENT: Lives with: lives with their spouse Lives in: House/apartment  OCCUPATION: retired Art gallery manager  PLOF: Independent, Independent with basic ADLs, Independent with household mobility without device, Independent with community mobility without device, Independent with homemaking with ambulation, Independent with gait, and Independent with transfers  PATIENT GOALS: He hopes to regain ability to be active and workout.  NEXT MD VISIT: prn  OBJECTIVE:  Note: Objective measures were completed at Evaluation unless otherwise noted.  DIAGNOSTIC FINDINGS:  12/14/2020 IMPRESSION: 1. L5 is numbered as a transitional vertebra to remain consistent with the previous report from December 2015. We would have to count down from the cervical region for true anatomic numbering. 2. T12-L1: No change. Osteophytes and shallow disc protrusion  indent the thecal sac but do not appear to cause neural compression. 3. L1-2: No change.  Mild non-compressive spondylosis. 4. L2-3: Endplate osteophytes and bulging of the disc. Mild bilateral lateral recess stenosis but without definite neural compression. Slight worsening since 2018. Chronic  discogenic endplate marrow changes, but with some persistent edema, which could be associated with regional back pain 5. L3-4: Newly seen discogenic endplate edema which could relate to back pain. Endplate osteophytes and bulging of the disc. Left foraminal narrowing that could affect the exiting L3 nerve. This foraminal stenosis is similar to the study of 2018. 6. L4-5: Chronic central disc protrusion with slight caudal down turning. This indents the thecal sac and contacts the S1 root sleeves, more on the left. This is unchanged from previous studies  PATIENT SURVEYS:  Modified Oswestry complete next visit   COGNITION: Overall cognitive status: Within functional limits for tasks assessed     SENSATION: WFL  MUSCLE LENGTH: Hamstrings: Right 45 deg; Left 50 deg Thomas test: Right pos; Left pos  POSTURE: rounded shoulders, forward head, decreased lumbar lordosis, posterior pelvic tilt, and flexed trunk   PALPATION: Mild scoliosis and trunk shift noted on palpation of lumbar spine and lower back  LUMBAR ROM:   AROM eval  Flexion WFL  Extension WFL  Right lateral flexion WFL  Left lateral flexion WFL  Right rotation WNL  Left rotation WNL   (Blank rows = not tested)  LOWER EXTREMITY ROM:     WFL  LOWER EXTREMITY MMT:    All 4+ to 5/5 bilateral LE's  LUMBAR SPECIAL TESTS:  Straight leg raise test: Negative  FUNCTIONAL TESTS:  5 times sit to stand: 6.49 sec Timed up and go (TUG): 7.48 sec  GAIT: Distance walked: 30 feet Assistive device utilized: None Level of assistance: Complete Independence Comments: short step length but good heel strike, slight trendelenburg on  right  TREATMENT DATE: 03/15/24  Initial eval completed and initiated HEP Educated on anatomy of the lumbar spine, proper posture and body mechanics Suggested ice/heat for pain control and to continue walking on good days if he is able.     Trigger Point Dry Needling Initial Treatment: Pt instructed on Dry Needling rational, procedures, and possible side effects. Pt instructed to expect mild to moderate muscle soreness later in the day and/or into the next day.  Pt instructed in methods to reduce muscle soreness. Pt instructed to continue prescribed HEP. Because Dry Needling was performed over or adjacent to a lung field, pt was educated on S/S of pneumothorax and to seek immediate medical attention should they occur.  Patient was educated on signs and symptoms of infection and other risk factors and advised to seek medical attention should they occur.  Patient verbalized understanding of these instructions and education.   Patient Verbal Consent Given: Yes Education Handout Provided: Yes Muscles Treated: left glut max, min and medius and piriformis Electrical Stimulation Performed: No Treatment Response/Outcome: Skilled palpation used to identify taut bands and trigger points.  Once identified, dry needling techniques used to treat these areas.  Twitch response ellicited along with palpable elongation of muscle.  Following treatment, patient reported mild soreness.  PATIENT EDUCATION:  Education details: See above, also initiated HEP, also educated on dry needling  Person educated: Patient Education method: Explanation, Demonstration, Verbal cues, and Handouts Education comprehension: verbalized understanding, returned demonstration, and verbal cues required  HOME EXERCISE PROGRAM: Access Code: [email or web address] URL: [email or web address] Date: 03/16/2024 Prepared  by: Aletha Anderson  Exercises - Standing Hamstring Stretch on Chair  - 1 x daily - 7 x weekly - 1 sets - 3 reps - 30 sec hold - Quadricep Stretch with Chair and Counter Support  - 1 x daily - 7 x weekly - 1 sets - 3 reps - 30 sec hold - Seated Figure 4 Piriformis Stretch  - 1 x daily - 7 x weekly - 1 sets - 3 reps - 30 sed hold  ASSESSMENT:  CLINICAL IMPRESSION: Patient is a 78 y.o. male who was seen today for physical therapy evaluation and treatment for left low back pain.  He presents with fairly normal lumbar ROM and bilateral LE strength.  He has very little lumbar lordosis and slight left trunk shift with mild acquired scoliosis.  Hamstrings, hip flexors, quads and hip rotators are all with very limited flexibility.  His gait is fairly normal with short step length and slight trendelenburg.  He is fwd flexed with rounded shoulders and fwd head posture.  He would benefit from skilled PT for LE flexibility, core strengthening, posture and body mechanics training, gait training and pain control.  He would also benefit from dry needling to left gluteals and piriformis.    OBJECTIVE IMPAIRMENTS: Abnormal gait, decreased mobility, difficulty walking, decreased strength, increased fascial restrictions, increased muscle spasms, impaired flexibility, postural dysfunction, and pain.   ACTIVITY LIMITATIONS: lifting, bending, standing, squatting, sleeping, stairs, transfers, bed mobility, bathing, and dressing  PARTICIPATION LIMITATIONS: meal prep, cleaning, laundry, driving, shopping, community activity, and yard work  PERSONAL FACTORS: Age, Past/current experiences, Time since onset of injury/illness/exacerbation, and 1-2 comorbidities: OA and Htn  are also affecting patient's functional outcome.   REHAB POTENTIAL: Good  CLINICAL DECISION MAKING: Stable/uncomplicated  EVALUATION COMPLEXITY: Low   GOALS: Goals reviewed with patient? Yes  SHORT TERM GOALS: Target date: 04/12/2024  Pain  report to be no greater than 4/10  Baseline: Goal status: INITIAL  2.  Patient will be independent with initial HEP  Baseline:  Goal status: INITIAL  3.  Patient to report 50% reduction in overall symptoms  Baseline:  Goal status: INITIAL  4.  Patient to be able to walk at least 1/2 mile without increase in pain Baseline:  Goal status: INITIAL   LONG TERM GOALS: Target date: 05/10/2024  Patient to report pain no greater than 2/10  Baseline:  Goal status: INITIAL  2.  Patient to be independent with advanced HEP  Baseline:  Goal status: INITIAL  3.  Patient to report 85% improvement in overall symptoms  Baseline:  Goal status: INITIAL  4.  Patient to be able to stand or walk for at least 15 min without left low back and buttock pain Baseline:  Goal status: INITIAL  5.  Patient to demonstrate improved posture including awareness of lumbar lordosis  Baseline:  Goal status: INITIAL  6.  Patient to be able to resume his prior level of activity/exercise Baseline:  Goal status: INITIAL  PLAN:  PT FREQUENCY: 1-2x/week  PT DURATION: 8 weeks  PLANNED INTERVENTIONS: 97110-Therapeutic exercises, 97530- Therapeutic activity, [id number]- Neuromuscular re-education, 97535- Self Care, [id number]- Manual therapy, [id number]- Gait training, [phone]- Aquatic Therapy, [phone]- Electrical stimulation (unattended), [phone]- Electrical  stimulation (manual), 74259- Ultrasound, [id number]- Traction (mechanical), 56387- Ionotophoresis 4mg /ml Dexamethasone, Patient/Family education, Balance training, Stair training, Taping, Dry Needling, Joint mobilization, Spinal mobilization, Cryotherapy, and Moist heat.  PLAN FOR NEXT SESSION: Review HEP, Nustep, assess response to DN#1, begin hip stability training and core strengthening.     Gorge Laud, PT 03/16/2024, 8:25 AM

## 2024-03-16 ENCOUNTER — Other Ambulatory Visit: Payer: Self-pay

## 2024-04-06 ENCOUNTER — Encounter: Payer: Self-pay | Admitting: Physical Therapy

## 2024-04-06 ENCOUNTER — Ambulatory Visit: Attending: Internal Medicine | Admitting: Physical Therapy

## 2024-04-06 DIAGNOSIS — R252 Cramp and spasm: Secondary | ICD-10-CM | POA: Diagnosis present

## 2024-04-06 DIAGNOSIS — M25552 Pain in left hip: Secondary | ICD-10-CM | POA: Insufficient documentation

## 2024-04-06 DIAGNOSIS — M6281 Muscle weakness (generalized): Secondary | ICD-10-CM | POA: Insufficient documentation

## 2024-04-06 DIAGNOSIS — R262 Difficulty in walking, not elsewhere classified: Secondary | ICD-10-CM | POA: Insufficient documentation

## 2024-04-06 DIAGNOSIS — R293 Abnormal posture: Secondary | ICD-10-CM | POA: Insufficient documentation

## 2024-04-06 DIAGNOSIS — M62838 Other muscle spasm: Secondary | ICD-10-CM | POA: Diagnosis present

## 2024-04-06 DIAGNOSIS — M5459 Other low back pain: Secondary | ICD-10-CM | POA: Diagnosis present

## 2024-04-06 DIAGNOSIS — R2689 Other abnormalities of gait and mobility: Secondary | ICD-10-CM | POA: Diagnosis present

## 2024-04-06 NOTE — Therapy (Signed)
 OUTPATIENT PHYSICAL THERAPY THORACOLUMBAR TREATMENT   Patient Name: Willie Burton MRN: [id number] DOB:1946/01/16, 78 y.o., male Today's Date: 04/06/2024  END OF SESSION:  PT End of Session - 04/06/24 1021     Visit Number 2    Date for PT Re-Evaluation 05/10/24    Authorization Type Medicare    Progress Note Due on Visit 10    PT Start Time 1016    PT Stop Time 1100    PT Time Calculation (min) 44 min    Activity Tolerance Patient tolerated treatment well    Behavior During Therapy WFL for tasks assessed/performed              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   Arthritis    Colon polyp    Coronary artery disease    Diverticula of colon    Dysrhythmia    PACs, PVCs   Heart disease    High blood pressure    High cholesterol    Hyperlipemia    Peripheral vascular disease (HCC)    Prostate cancer (HCC)    Past Surgical History:  Procedure Laterality Date   ABDOMINAL AORTOGRAM W/LOWER EXTREMITY N/A 10/16/2017   Procedure: ABDOMINAL AORTOGRAM W/LOWER EXTREMITY;  Surgeon: Richrd Char, MD;  Location: [facility];  Service: Cardiovascular;  Laterality: N/A;   APPENDECTOMY     CARDIAC CATHETERIZATION N/A 11/13/2015   Procedure: Left Heart Cath and Coronary Angiography;  Surgeon: Ronney Cola, MD;  Location: [facility];  Service: Cardiovascular;  Laterality: N/A;   CATARACT EXTRACTION     COLON SURGERY  2008   right colon resection   COLONOSCOPY WITH PROPOFOL  N/A 01/06/2018   Procedure: COLONOSCOPY WITH PROPOFOL ;  Surgeon: Toledo, Alphonsus Jeans, MD;  Location: [facility];  Service: Gastroenterology;  Laterality: N/A;   ENDARTERECTOMY POPLITEAL Right 11/11/2017   FEMORAL-POPLITEAL BYPASS GRAFT Right 11/11/2017   Procedure: RIGHT POPLITEAL ENDARTERECTOMY;  Surgeon: Richrd Char, MD;  Location: [facility] OR;  Service: Vascular;  Laterality: Right;   LIPOMA EXCISION Right    thigh    METACARPOPHALANGEAL JOINT ARTHROPLASTY     PATCH ANGIOPLASTY Right 11/11/2017   Procedure: PATCH ANGIOPLASTY of Superficial Fenoral Artery;  Surgeon: Richrd Char, MD;  Location: [facility] OR;  Service: Vascular;  Laterality: Right;   PROSTATECTOMY  2003   ULNAR NERVE TRANSPOSITION Left January 2015   Patient Active Problem List   Diagnosis Date Noted   History of colonic polyps 10/19/2023   Primary hypertension 10/17/2021   PAD (peripheral artery disease) (HCC) 10/09/2021   Hyperlipidemia LDL goal <70 10/08/2021   Other microscopic hematuria 10/08/2021   B12 deficiency 06/07/2021   Arthritis of right hand 04/11/2021   Achilles tendonitis, bilateral 03/16/2020   Bilateral Achilles tendonosis 05/16/2019   Claudication of calf muscles (HCC) 08/11/2017   Chronic bilateral low back pain with right-sided sciatica 08/11/2017   Carotid stenosis 05/05/2017   Presbycusis of both ears 03/20/2017   Pulsatile tinnitus of both ears 03/20/2017   H/O coronary artery bypass surgery 11/20/2015   Atherosclerotic heart disease of native coronary artery without angina pectoris 11/15/2015   HNP (herniated nucleus pulposus), lumbar 11/15/2014   Lumbar radiculitis 11/15/2014   Ulnar neuropathy of left upper extremity 10/13/2014   AC (acromioclavicular) arthritis 04/04/2013   CMC arthritis, thumb, degenerative 06/14/2012    PCP: Azalia Leo, MD  REFERRING PROVIDER: Skeeter Dukes, MD  REFERRING DIAG: [phone] (  ICD-10-CM) - Spondylosis without myelopathy or radiculopathy, lumbar region  Rationale for Evaluation and Treatment: Rehabilitation  THERAPY DIAG:  Pain in left hip  Muscle weakness (generalized)  Cramp and spasm  Difficulty in walking, not elsewhere classified  ONSET DATE: 03/14/2024  SUBJECTIVE:                                                                                                                                                                                            SUBJECTIVE STATEMENT: Patient reports little response to DN.  Pain is localized to Lt buttock.  PERTINENT HISTORY:  na  PAIN:  Are you having pain? Yes: NPRS scale: generally 2/10 Pain location: left low back Pain description: aching Aggravating factors: standing, bending Relieving factors: rest, meds  PRECAUTIONS: None  RED FLAGS: None   WEIGHT BEARING RESTRICTIONS: No  FALLS:  Has patient fallen in last 6 months? No  LIVING ENVIRONMENT: Lives with: lives with their spouse Lives in: House/apartment  OCCUPATION: retired Art gallery manager  PLOF: Independent, Independent with basic ADLs, Independent with household mobility without device, Independent with community mobility without device, Independent with homemaking with ambulation, Independent with gait, and Independent with transfers  PATIENT GOALS: He hopes to regain ability to be active and workout.  NEXT MD VISIT: prn  OBJECTIVE:  Note: Objective measures were completed at Evaluation unless otherwise noted.  DIAGNOSTIC FINDINGS:  12/14/2020 IMPRESSION: 1. L5 is numbered as a transitional vertebra to remain consistent with the previous report from December 2015. We would have to count down from the cervical region for true anatomic numbering. 2. T12-L1: No change. Osteophytes and shallow disc protrusion indent the thecal sac but do not appear to cause neural compression. 3. L1-2: No change.  Mild non-compressive spondylosis. 4. L2-3: Endplate osteophytes and bulging of the disc. Mild bilateral lateral recess stenosis but without definite neural compression. Slight worsening since 2018. Chronic discogenic endplate marrow changes, but with some persistent edema, which could be associated with regional back pain 5. L3-4: Newly seen discogenic endplate edema which could relate to back pain. Endplate osteophytes and bulging of the disc. Left foraminal narrowing that could affect the exiting L3 nerve. This foraminal  stenosis is similar to the study of 2018. 6. L4-5: Chronic central disc protrusion with slight caudal down turning. This indents the thecal sac and contacts the S1 root sleeves, more on the left. This is unchanged from previous studies  PATIENT SURVEYS:  Modified Oswestry complete next visit   COGNITION: Overall cognitive status: Within functional limits for tasks assessed     SENSATION: WFL  MUSCLE LENGTH: Hamstrings: Right 45 deg; Left 50 deg  Thomas test: Right pos; Left pos  POSTURE: rounded shoulders, forward head, decreased lumbar lordosis, posterior pelvic tilt, and flexed trunk   PALPATION: Mild scoliosis and trunk shift noted on palpation of lumbar spine and lower back  LUMBAR ROM:   AROM eval  Flexion WFL  Extension WFL  Right lateral flexion WFL  Left lateral flexion WFL  Right rotation WNL  Left rotation WNL   (Blank rows = not tested)  LOWER EXTREMITY ROM:     WFL  LOWER EXTREMITY MMT:    All 4+ to 5/5 bilateral LE's  LUMBAR SPECIAL TESTS:  Straight leg raise test: Negative  FUNCTIONAL TESTS:  5 times sit to stand: 6.49 sec Timed up and go (TUG): 7.48 sec  GAIT: Distance walked: 30 feet Assistive device utilized: None Level of assistance: Complete Independence Comments: short step length but good heel strike, slight trendelenburg on right  TREATMENT DATE:  04/06/24: LTR 3x10" bil Fig 4 Lt supine x30" with foot off table Piriformis supine Lt x30" (pull knee to opp shoulder) PPT x10 90/90 alt march in PPT [place] dog - too hard Bridge 10x5" holds Prone hip ext x 8 each - much harder Lt SL hip abd x10 - much harder Lt Sit to stand from mat table red loop at knees with 10lb OH press x 10 Sidestepping and monster walks red loop at thighs (needs black band next time) 8" fwd step up Lt LE with Rt LE march to 90/90 x10x5" 8" fwd step up Lt LE with double red tband shoulder extension Rt UE x8 - balance challenged with this Hip matrix 55lb hip abd  and ext x10 each bil RPE 3/10 today Prone lying: central PA and UPA Lt L5/S1 Gr II-IV Prone press ups for lumbar extension  Standing lumbar ext with thumbs pressing in at L5    03/15/24  Initial eval completed and initiated HEP Educated on anatomy of the lumbar spine, proper posture and body mechanics Suggested ice/heat for pain control and to continue walking on good days if he is able.     Trigger Point Dry Needling Initial Treatment: Pt instructed on Dry Needling rational, procedures, and possible side effects. Pt instructed to expect mild to moderate muscle soreness later in the day and/or into the next day.  Pt instructed in methods to reduce muscle soreness. Pt instructed to continue prescribed HEP. Because Dry Needling was performed over or adjacent to a lung field, pt was educated on S/S of pneumothorax and to seek immediate medical attention should they occur.  Patient was educated on signs and symptoms of infection and other risk factors and advised to seek medical attention should they occur.  Patient verbalized understanding of these instructions and education.   Patient Verbal Consent Given: Yes Education Handout Provided: Yes Muscles Treated: left glut max, min and medius and piriformis Electrical Stimulation Performed: No Treatment Response/Outcome: Skilled palpation used to identify taut bands and trigger points.  Once identified, dry needling techniques used to treat these areas.  Twitch response ellicited along with palpable elongation of muscle.  Following treatment, patient reported mild soreness.  PATIENT EDUCATION:  Education details: See above, also initiated HEP, also educated on dry needling  Person educated: Patient Education method: Explanation, Demonstration, Verbal cues, and Handouts Education comprehension: verbalized understanding, returned  demonstration, and verbal cues required  HOME EXERCISE PROGRAM: Access Code: [email or web address] URL: [email or web address] Date: 04/06/2024 Prepared by: Minor Amble Aamani Moose  Exercises - Standing Hamstring Stretch on Chair  - 1 x daily - 7 x weekly - 1 sets - 3 reps - 30 sec hold - Quadricep Stretch with Chair and Counter Support  - 1 x daily - 7 x weekly - 1 sets - 3 reps - 30 sec hold - Seated Figure 4 Piriformis Stretch  - 1 x daily - 7 x weekly - 1 sets - 3 reps - 30 sed hold - Foam Roll 90/90 March  - 1 x daily - 7 x weekly - 1 sets - 10 reps - Squat with Chair Touch and Resistance Loop  - 1 x daily - 7 x weekly - 2 sets - 10 reps - Hip Abduction with Resistance Loop  - 1 x daily - 7 x weekly - 2 sets - 10 reps - Hip Extension with Resistance Loop  - 1 x daily - 7 x weekly - 2 sets - 10 reps - Standing Lumbar Extension  - 1 x daily - 7 x weekly - 1 sets - 10 reps - Standing Lumbar Extension with Counter  - 1 x daily - 7 x weekly - 1 sets - 10 reps  ASSESSMENT:  CLINICAL IMPRESSION: Patient was able to participate in all therex without pain.  He reports his pain will come on with prolonged walking.  He has challenges maintaining deep core with supine therex.  Prone mobs performed today to Lt L5/S1 facet which is quite hypomobile.    OBJECTIVE IMPAIRMENTS: Abnormal gait, decreased mobility, difficulty walking, decreased strength, increased fascial restrictions, increased muscle spasms, impaired flexibility, postural dysfunction, and pain.   ACTIVITY LIMITATIONS: lifting, bending, standing, squatting, sleeping, stairs, transfers, bed mobility, bathing, and dressing  PARTICIPATION LIMITATIONS: meal prep, cleaning, laundry, driving, shopping, community activity, and yard work  PERSONAL FACTORS: Age, Past/current experiences, Time since onset of injury/illness/exacerbation, and 1-2 comorbidities: OA and Htn  are also affecting patient's functional outcome.   REHAB POTENTIAL:  Good  CLINICAL DECISION MAKING: Stable/uncomplicated  EVALUATION COMPLEXITY: Low   GOALS: Goals reviewed with patient? Yes  SHORT TERM GOALS: Target date: 04/12/2024  Pain report to be no greater than 4/10  Baseline: Goal status: INITIAL  2.  Patient will be independent with initial HEP  Baseline:  Goal status: ongoing  3.  Patient to report 50% reduction in overall symptoms  Baseline:  Goal status: INITIAL  4.  Patient to be able to walk at least 1/2 mile without increase in pain Baseline:  Goal status: INITIAL   LONG TERM GOALS: Target date: 05/10/2024  Patient to report pain no greater than 2/10  Baseline:  Goal status: INITIAL  2.  Patient to be independent with advanced HEP  Baseline:  Goal status: INITIAL  3.  Patient to report 85% improvement in overall symptoms  Baseline:  Goal status: INITIAL  4.  Patient to be able to stand or walk for at least 15 min without left low back and buttock pain Baseline:  Goal status: INITIAL  5.  Patient to demonstrate improved posture including awareness of lumbar lordosis  Baseline:  Goal status: INITIAL  6.  Patient to be able to resume his prior level of  activity/exercise Baseline:  Goal status: INITIAL  PLAN:  PT FREQUENCY: 1-2x/week  PT DURATION: 8 weeks  PLANNED INTERVENTIONS: 97110-Therapeutic exercises, 97530- Therapeutic activity, [id number]- Neuromuscular re-education, 97535- Self Care, [id number]- Manual therapy, [phone]- Gait training, [phone]- Aquatic Therapy, [phone]- Electrical stimulation (unattended), [phone]- Electrical stimulation (manual), [id number]- Ultrasound, [id number]- Traction (mechanical), [id number]- Ionotophoresis 4mg /ml Dexamethasone , Patient/Family education, Balance training, Stair training, Taping, Dry Needling, Joint mobilization, Spinal mobilization, Cryotherapy, and Moist heat.  PLAN FOR NEXT SESSION: Review HEP, Nustep, assess response to DN#1, begin hip stability training and core strengthening.     Foch Rosenwald, PT 04/06/24 1:51 PM

## 2024-04-08 ENCOUNTER — Ambulatory Visit

## 2024-04-08 DIAGNOSIS — M25552 Pain in left hip: Secondary | ICD-10-CM

## 2024-04-08 DIAGNOSIS — R2689 Other abnormalities of gait and mobility: Secondary | ICD-10-CM

## 2024-04-08 DIAGNOSIS — M6281 Muscle weakness (generalized): Secondary | ICD-10-CM

## 2024-04-08 DIAGNOSIS — R293 Abnormal posture: Secondary | ICD-10-CM

## 2024-04-08 DIAGNOSIS — R252 Cramp and spasm: Secondary | ICD-10-CM

## 2024-04-08 DIAGNOSIS — R262 Difficulty in walking, not elsewhere classified: Secondary | ICD-10-CM

## 2024-04-08 DIAGNOSIS — M62838 Other muscle spasm: Secondary | ICD-10-CM

## 2024-04-08 NOTE — Therapy (Signed)
 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   Arthritis    Colon polyp    Coronary artery disease    Diverticula of colon    Dysrhythmia    PACs, PVCs   Heart disease    High blood pressure    High cholesterol    Hyperlipemia    Peripheral vascular disease (HCC)    Prostate cancer (HCC)    Past Surgical History:  Procedure Laterality Date   ABDOMINAL AORTOGRAM W/LOWER EXTREMITY N/A 10/16/2017   Procedure: ABDOMINAL AORTOGRAM W/LOWER EXTREMITY;  Surgeon: Richrd Char, MD;  Location: [facility];  Service: Cardiovascular;  Laterality: N/A;   APPENDECTOMY     CARDIAC CATHETERIZATION N/A 11/13/2015   Procedure: Left Heart Cath and Coronary Angiography;  Surgeon: Ronney Cola, MD;  Location: [facility];  Service: Cardiovascular;  Laterality: N/A;   CATARACT EXTRACTION     COLON SURGERY  2008   right colon resection   COLONOSCOPY WITH PROPOFOL  N/A 01/06/2018   Procedure: COLONOSCOPY WITH PROPOFOL ;  Surgeon: Toledo, Alphonsus Jeans, MD;  Location: [facility];  Service: Gastroenterology;  Laterality: N/A;   ENDARTERECTOMY POPLITEAL Right 11/11/2017   FEMORAL-POPLITEAL BYPASS GRAFT Right 11/11/2017   Procedure: RIGHT POPLITEAL ENDARTERECTOMY;  Surgeon: Richrd Char, MD;  Location: [facility] OR;  Service: Vascular;  Laterality: Right;   LIPOMA EXCISION Right    thigh    METACARPOPHALANGEAL JOINT ARTHROPLASTY     PATCH ANGIOPLASTY Right 11/11/2017   Procedure: PATCH ANGIOPLASTY of Superficial Fenoral Artery;  Surgeon: Richrd Char, MD;  Location: [facility] OR;  Service: Vascular;  Laterality: Right;   PROSTATECTOMY  2003   ULNAR NERVE TRANSPOSITION Left January 2015   Patient Active Problem List   Diagnosis Date Noted   History of colonic polyps 10/19/2023   Primary hypertension 10/17/2021   PAD (peripheral artery disease) (HCC) 10/09/2021   Hyperlipidemia LDL goal <70 10/08/2021   Other microscopic hematuria 10/08/2021   B12 deficiency 06/07/2021   Arthritis of right hand 04/11/2021   Achilles tendonitis, bilateral 03/16/2020   Bilateral Achilles tendonosis 05/16/2019   Claudication of calf muscles (HCC) 08/11/2017   Chronic bilateral low back pain with right-sided sciatica 08/11/2017   Carotid stenosis 05/05/2017   Presbycusis of both ears 03/20/2017   Pulsatile tinnitus of both ears 03/20/2017   H/O coronary artery bypass surgery 11/20/2015   Atherosclerotic heart disease of native coronary artery without angina pectoris 11/15/2015   HNP (herniated nucleus pulposus), lumbar 11/15/2014   Lumbar radiculitis 11/15/2014   Ulnar neuropathy of left upper extremity 10/13/2014   AC (acromioclavicular) arthritis 04/04/2013   CMC arthritis, thumb, degenerative 06/14/2012    PCP: Azalia Leo, MD  REFERRING PROVIDER: Skeeter Dukes, MD  REFERRING DIAG: [phone] (  ICD-10-CM) - Spondylosis without myelopathy or radiculopathy, lumbar region  Rationale for Evaluation and Treatment: Rehabilitation  THERAPY DIAG:  Pain in left hip  Muscle weakness (generalized)  Cramp and spasm  Difficulty in walking, not elsewhere classified  Other muscle spasm  Abnormal posture  Other abnormalities of gait and mobility  ONSET DATE: 03/14/2024  SUBJECTIVE:                                                                                                                                                                                            SUBJECTIVE STATEMENT: Patient reports he is feeling like he has learned a lot and feeling more flexible but the pain he originally came for is about the same.    PERTINENT HISTORY:  na  PAIN:  04/08/24 Are you having pain? Yes: NPRS scale: generally 2/10 Pain location: left low back Pain description: aching Aggravating factors: standing, bending Relieving factors: rest, meds  PRECAUTIONS: None  RED FLAGS: None   WEIGHT BEARING RESTRICTIONS: No  FALLS:  Has patient fallen in last 6 months? No  LIVING ENVIRONMENT: Lives with: lives with their spouse Lives in: House/apartment  OCCUPATION: retired Art gallery manager  PLOF: Independent, Independent with basic ADLs, Independent with household mobility without device, Independent with community mobility without device, Independent with homemaking with ambulation, Independent with gait, and Independent with transfers  PATIENT GOALS: He hopes to regain ability to be active and workout.  NEXT MD VISIT: prn  OBJECTIVE:  Note: Objective measures were completed at Evaluation unless otherwise noted.  DIAGNOSTIC FINDINGS:  12/14/2020 IMPRESSION: 1. L5 is numbered as a transitional vertebra to remain consistent with the previous report from December 2015. We would have to count down from the cervical region for true anatomic numbering. 2. T12-L1: No change. Osteophytes and shallow disc protrusion indent the thecal sac but do not appear to cause neural compression. 3. L1-2: No change.  Mild non-compressive spondylosis. 4. L2-3: Endplate osteophytes and bulging of the disc. Mild bilateral lateral recess stenosis but without definite neural compression. Slight worsening since 2018. Chronic discogenic endplate marrow changes, but with some persistent edema, which could be associated with regional back pain 5. L3-4: Newly seen discogenic endplate edema  which could relate to back pain. Endplate osteophytes and bulging of the disc. Left foraminal narrowing that could affect the exiting L3 nerve. This foraminal stenosis is similar to the study of 2018. 6. L4-5: Chronic central disc protrusion with slight caudal down turning. This indents the thecal sac and contacts the S1 root sleeves, more on the left. This is unchanged from previous studies  PATIENT SURVEYS:  Modified Oswestry complete  next visit   COGNITION: Overall cognitive status: Within functional limits for tasks assessed     SENSATION: WFL  MUSCLE LENGTH: Hamstrings: Right 45 deg; Left 50 deg Thomas test: Right pos; Left pos  POSTURE: rounded shoulders, forward head, decreased lumbar lordosis, posterior pelvic tilt, and flexed trunk   PALPATION: Mild scoliosis and trunk shift noted on palpation of lumbar spine and lower back  LUMBAR ROM:   AROM eval  Flexion WFL  Extension WFL  Right lateral flexion WFL  Left lateral flexion WFL  Right rotation WNL  Left rotation WNL   (Blank rows = not tested)  LOWER EXTREMITY ROM:     WFL  LOWER EXTREMITY MMT:    All 4+ to 5/5 bilateral LE's  LUMBAR SPECIAL TESTS:  Straight leg raise test: Negative  FUNCTIONAL TESTS:  5 times sit to stand: 6.49 sec Timed up and go (TUG): 7.48 sec  GAIT: Distance walked: 30 feet Assistive device utilized: None Level of assistance: Complete Independence Comments: short step length but good heel strike, slight trendelenburg on right  TREATMENT DATE:  04/08/24: Nustep x 5 min Reviewed all stretches: patient needed several vc's for correct technique which could be why he was not getting any relief with the original stretches Added core strengthening as follows: PPT x 20 PPT with 90/90 heel tap PPT with dying bug (patient struggled with unsupported - will likely have to do supported at home) Discussed engaging core and upright posture when doing inclines during his walk Trigger  Point Dry Needling Subsequent Treatment: Instructions provided previously at initial dry needling treatment.  Patient Verbal Consent Given: Yes Education Handout Provided: Previously Provided Muscles Treated: lumbar multifidi, left glut max, min and medius, and piriformis Electrical Stimulation Performed: No Treatment Response/Outcome: Skilled palpation used to identify taut bands and trigger points.  Once identified, dry needling techniques used to treat these areas.  Twitch response ellicited along with palpable elongation of muscle.  Following treatment, pt reported some soreness in the gluteal area and mild headache.  Suggested using ice and drinking water consistently today to alleviate these symptoms.     04/06/24: LTR 3x10" bil Fig 4 Lt supine x30" with foot off table Piriformis supine Lt x30" (pull knee to opp shoulder) PPT x10 90/90 alt march in PPT [place] dog - too hard Bridge 10x5" holds Prone hip ext x 8 each - much harder Lt SL hip abd x10 - much harder Lt Sit to stand from mat table red loop at knees with 10lb OH press x 10 Sidestepping and monster walks red loop at thighs (needs black band next time) 8" fwd step up Lt LE with Rt LE march to 90/90 x10x5" 8" fwd step up Lt LE with double red tband shoulder extension Rt UE x8 - balance challenged with this Hip matrix 55lb hip abd and ext x10 each bil RPE 3/10 today Prone lying: central PA and UPA Lt L5/S1 Gr II-IV Prone press ups for lumbar extension  Standing lumbar ext with thumbs pressing in at L5    03/15/24  Initial eval completed and initiated HEP Educated on anatomy of the lumbar spine, proper posture and body mechanics Suggested ice/heat for pain control and to continue walking on good days if he is able.     Trigger Point Dry Needling Initial Treatment: Pt instructed on Dry Needling rational, procedures, and possible side effects. Pt instructed to expect mild to moderate muscle soreness later in the day and/or into  the next day.  Pt instructed in methods to reduce muscle soreness. Pt instructed to continue prescribed HEP. Because Dry Needling was performed over or adjacent to a lung field, pt was educated on S/S of pneumothorax and to seek immediate medical attention should they occur.  Patient was educated on signs and symptoms of infection and other risk factors and advised to seek medical attention should they occur.  Patient verbalized understanding of these instructions and education.  Patient Verbal Consent Given: Yes Education Handout Provided: Yes Muscles Treated: left glut max, min and medius and piriformis Electrical Stimulation Performed: No Treatment Response/Outcome: Skilled palpation used to identify taut bands and trigger points.  Once identified, dry needling techniques used to treat these areas.  Twitch response ellicited along with palpable elongation of muscle.  Following treatment, patient reported mild soreness.                                                                                                                     PATIENT EDUCATION:  Education details: See above, also initiated HEP, also educated on dry needling  Person educated: Patient Education method: Explanation, Demonstration, Verbal cues, and Handouts Education comprehension: verbalized understanding, returned demonstration, and verbal cues required  HOME EXERCISE PROGRAM: Access Code: [email or web address] URL: [email or web address] Date: 04/06/2024 Prepared by: Minor Amble Beuhring  Exercises - Standing Hamstring Stretch on Chair  - 1 x daily - 7 x weekly - 1 sets - 3 reps - 30 sec hold - Quadricep Stretch with Chair and Counter Support  - 1 x daily - 7 x weekly - 1 sets - 3 reps - 30 sec hold - Seated Figure 4 Piriformis Stretch  - 1 x daily - 7 x weekly - 1 sets - 3 reps - 30 sed hold - Foam Roll 90/90 March  - 1 x daily - 7 x weekly - 1 sets - 10 reps - Squat with Chair Touch and Resistance Loop  - 1 x  daily - 7 x weekly - 2 sets - 10 reps - Hip Abduction with Resistance Loop  - 1 x daily - 7 x weekly - 2 sets - 10 reps - Hip Extension with Resistance Loop  - 1 x daily - 7 x weekly - 2 sets - 10 reps - Standing Lumbar Extension  - 1 x daily - 7 x weekly - 1 sets - 10 reps - Standing Lumbar Extension with Counter  - 1 x daily - 7 x weekly - 1 sets - 10 reps  ASSESSMENT:  CLINICAL IMPRESSION: [place] is progressing slowly.  He walks for exercise daily and only has pain when going up incline.  We reviewed stretching exercises initially provided.  He needed several corrections to technique.  We tried added core stabilization today.  He struggled with unsupported core work but completed all reps.  Suggested splitting his sets into 5 reps if needed.  Did DN again to lumbar multifidi and left gluteals and piriformis.  He had a signficant twitch response in the left piriformis with  some soreness and also reported mild headache after DN.  Suggested hydrating and ice if soreness persists.  He would benefit from continuing skilled PT to meet goals below.    OBJECTIVE IMPAIRMENTS: Abnormal gait, decreased mobility, difficulty walking, decreased strength, increased fascial restrictions, increased muscle spasms, impaired flexibility, postural dysfunction, and pain.   ACTIVITY LIMITATIONS: lifting, bending, standing, squatting, sleeping, stairs, transfers, bed mobility, bathing, and dressing  PARTICIPATION LIMITATIONS: meal prep, cleaning, laundry, driving, shopping, community activity, and yard work  PERSONAL FACTORS: Age, Past/current experiences, Time since onset of injury/illness/exacerbation, and 1-2 comorbidities: OA and Htn are also affecting patient's functional outcome.   REHAB POTENTIAL: Good  CLINICAL DECISION MAKING: Stable/uncomplicated  EVALUATION COMPLEXITY: Low   GOALS: Goals reviewed with patient? Yes  SHORT TERM GOALS: Target date: 04/12/2024  Pain report to be no greater than 4/10   Baseline: Goal status: INITIAL  2.  Patient will be independent with initial HEP  Baseline:  Goal status: ongoing  3.  Patient to report 50% reduction in overall symptoms  Baseline:  Goal status: INITIAL  4.  Patient to be able to walk at least 1/2 mile without increase in pain Baseline:  Goal status: INITIAL   LONG TERM GOALS: Target date: 05/10/2024  Patient to report pain no greater than 2/10  Baseline:  Goal status: INITIAL  2.  Patient to be independent with advanced HEP  Baseline:  Goal status: INITIAL  3.  Patient to report 85% improvement in overall symptoms  Baseline:  Goal status: INITIAL  4.  Patient to be able to stand or walk for at least 15 min without left low back and buttock pain Baseline:  Goal status: INITIAL  5.  Patient to demonstrate improved posture including awareness of lumbar lordosis  Baseline:  Goal status: INITIAL  6.  Patient to be able to resume his prior level of activity/exercise Baseline:  Goal status: INITIAL  PLAN:  PT FREQUENCY: 1-2x/week  PT DURATION: 8 weeks  PLANNED INTERVENTIONS: 97110-Therapeutic exercises, 97530- Therapeutic activity, 97112- Neuromuscular re-education, 97535- Self Care, 16109- Manual therapy, [phone]- Gait training, [phone]- Aquatic Therapy, [phone]- Electrical stimulation (unattended), [phone]- Electrical stimulation (manual), [id number]- Ultrasound, [id number]- Traction (mechanical), [id number]- Ionotophoresis 4mg /ml Dexamethasone , Patient/Family education, Balance training, Stair training, Taping, Dry Needling, Joint mobilization, Spinal mobilization, Cryotherapy, and Moist heat.  PLAN FOR NEXT SESSION: Review HEP, Nustep, assess response to DN#2, progress core and  hip stability training and core strengthening.     [facility]get[facility] Campus B. Jearlene Bridwell, PT 04/08/24 11:26 AM [facility] [address] [place], [place] [address] Phone # [phone] Fax [phone]

## 2024-04-10 NOTE — Therapy (Signed)
 OUTPATIENT PHYSICAL THERAPY THORACOLUMBAR TREATMENT   Patient Name: Willie Burton MRN: [id number] DOB:01-Nov-1946, 78 y.o., male Today's Date: 04/11/2024  END OF SESSION:  PT End of Session - 04/11/24 1022     Visit Number 4    Date for PT Re-Evaluation 05/10/24    Authorization Type Medicare    Progress Note Due on Visit 10    PT Start Time 1017    PT Stop Time 1059    PT Time Calculation (min) 42 min    Activity Tolerance Patient tolerated treatment well    Behavior During Therapy WFL for tasks assessed/performed               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   Arthritis    Colon polyp    Coronary artery disease    Diverticula of colon    Dysrhythmia    PACs, PVCs   Heart disease    High blood pressure    High cholesterol    Hyperlipemia    Peripheral vascular disease (HCC)    Prostate cancer (HCC)    Past Surgical History:  Procedure Laterality Date   ABDOMINAL AORTOGRAM W/LOWER EXTREMITY N/A 10/16/2017   Procedure: ABDOMINAL AORTOGRAM W/LOWER EXTREMITY;  Surgeon: Richrd Char, MD;  Location: [facility];  Service: Cardiovascular;  Laterality: N/A;   APPENDECTOMY     CARDIAC CATHETERIZATION N/A 11/13/2015   Procedure: Left Heart Cath and Coronary Angiography;  Surgeon: Ronney Cola, MD;  Location: [facility];  Service: Cardiovascular;  Laterality: N/A;   CATARACT EXTRACTION     COLON SURGERY  2008   right colon resection   COLONOSCOPY WITH PROPOFOL  N/A 01/06/2018   Procedure: COLONOSCOPY WITH PROPOFOL ;  Surgeon: Toledo, Alphonsus Jeans, MD;  Location: [facility];  Service: Gastroenterology;  Laterality: N/A;   ENDARTERECTOMY POPLITEAL Right 11/11/2017   FEMORAL-POPLITEAL BYPASS GRAFT Right 11/11/2017   Procedure: RIGHT POPLITEAL ENDARTERECTOMY;  Surgeon: Richrd Char, MD;  Location: [facility] OR;  Service: Vascular;  Laterality: Right;   LIPOMA EXCISION Right    thigh    METACARPOPHALANGEAL JOINT ARTHROPLASTY     PATCH ANGIOPLASTY Right 11/11/2017   Procedure: PATCH ANGIOPLASTY of Superficial Fenoral Artery;  Surgeon: Richrd Char, MD;  Location: [facility] OR;  Service: Vascular;  Laterality: Right;   PROSTATECTOMY  2003   ULNAR NERVE TRANSPOSITION Left January 2015   Patient Active Problem List   Diagnosis Date Noted   History of colonic polyps 10/19/2023   Primary hypertension 10/17/2021   PAD (peripheral artery disease) (HCC) 10/09/2021   Hyperlipidemia LDL goal <70 10/08/2021   Other microscopic hematuria 10/08/2021   B12 deficiency 06/07/2021   Arthritis of right hand 04/11/2021   Achilles tendonitis, bilateral 03/16/2020   Bilateral Achilles tendonosis 05/16/2019   Claudication of calf muscles (HCC) 08/11/2017   Chronic bilateral low back pain with right-sided sciatica 08/11/2017   Carotid stenosis 05/05/2017   Presbycusis of both ears 03/20/2017   Pulsatile tinnitus of both ears 03/20/2017   H/O coronary artery bypass surgery 11/20/2015   Atherosclerotic heart disease of native coronary artery without angina pectoris 11/15/2015   HNP (herniated nucleus pulposus), lumbar 11/15/2014   Lumbar radiculitis 11/15/2014   Ulnar neuropathy of left upper extremity 10/13/2014   AC (acromioclavicular) arthritis 04/04/2013   CMC arthritis, thumb, degenerative 06/14/2012    PCP: Azalia Leo, MD  REFERRING PROVIDER: Skeeter Dukes, MD  REFERRING DIAG:  M47.816 (ICD-10-CM) - Spondylosis without myelopathy or radiculopathy, lumbar region  Rationale for Evaluation and Treatment: Rehabilitation  THERAPY DIAG:  Pain in left hip  Muscle weakness (generalized)  Cramp and spasm  Difficulty in walking, not elsewhere classified  Other muscle spasm  Abnormal posture  ONSET DATE: 03/14/2024  SUBJECTIVE:                                                                                                                                                                                            SUBJECTIVE STATEMENT: The 90/90 exercise is causing low back pain.   PERTINENT HISTORY:  na  PAIN:  04/08/24 Are you having pain? Yes: NPRS scale: generally 4/10 Pain location: left low back Pain description: aching Aggravating factors: standing, bending Relieving factors: rest, meds  PRECAUTIONS: None  RED FLAGS: None   WEIGHT BEARING RESTRICTIONS: No  FALLS:  Has patient fallen in last 6 months? No  LIVING ENVIRONMENT: Lives with: lives with their spouse Lives in: House/apartment  OCCUPATION: retired Art gallery manager  PLOF: Independent, Independent with basic ADLs, Independent with household mobility without device, Independent with community mobility without device, Independent with homemaking with ambulation, Independent with gait, and Independent with transfers  PATIENT GOALS: He hopes to regain ability to be active and workout.  NEXT MD VISIT: prn  OBJECTIVE:  Note: Objective measures were completed at Evaluation unless otherwise noted.  DIAGNOSTIC FINDINGS:  12/14/2020 IMPRESSION: 1. L5 is numbered as a transitional vertebra to remain consistent with the previous report from December 2015. We would have to count down from the cervical region for true anatomic numbering. 2. T12-L1: No change. Osteophytes and shallow disc protrusion indent the thecal sac but do not appear to cause neural compression. 3. L1-2: No change.  Mild non-compressive spondylosis. 4. L2-3: Endplate osteophytes and bulging of the disc. Mild bilateral lateral recess stenosis but without definite neural compression. Slight worsening since 2018. Chronic discogenic endplate marrow changes, but with some persistent edema, which could be associated with regional back pain 5. L3-4: Newly seen discogenic endplate edema which could relate to back pain. Endplate osteophytes and bulging of the disc. Left foraminal narrowing that could affect the exiting L3  nerve. This foraminal stenosis is similar to the study of 2018. 6. L4-5: Chronic central disc protrusion with slight caudal down turning. This indents the thecal sac and contacts the S1 root sleeves, more on the left. This is unchanged from previous studies  PATIENT SURVEYS:  Modified Oswestry complete next visit   COGNITION: Overall cognitive status: Within functional limits for tasks assessed     SENSATION: WFL  MUSCLE LENGTH: Hamstrings: Right  45 deg; Left 50 deg Thomas test: Right pos; Left pos  POSTURE: rounded shoulders, forward head, decreased lumbar lordosis, posterior pelvic tilt, and flexed trunk   PALPATION: Mild scoliosis and trunk shift noted on palpation of lumbar spine and lower back  LUMBAR ROM:   AROM eval  Flexion WFL  Extension WFL  Right lateral flexion WFL  Left lateral flexion WFL  Right rotation WNL  Left rotation WNL   (Blank rows = not tested)  LOWER EXTREMITY ROM:     WFL  LOWER EXTREMITY MMT:    All 4+ to 5/5 bilateral LE's  LUMBAR SPECIAL TESTS:  Straight leg raise test: Negative  FUNCTIONAL TESTS:  5 times sit to stand: 6.49 sec Timed up and go (TUG): 7.48 sec  GAIT: Distance walked: 30 feet Assistive device utilized: None Level of assistance: Complete Independence Comments: short step length but good heel strike, slight trendelenburg on right  TREATMENT DATE:  04/11/24: Nustep x 6 min SKTC 2 x 30 sec  90/90 heel taps Dying bug - feels in the low back after 2 reps Prone hip ext x 10 B Quadriped with leg ext x 5 ea Quad with alt arm and leg x 3 ea Manual: UPA mobs B lumbar; TPR to L QL Trigger Point Dry Needling  Subsequent Treatment: Instructions provided previously at initial dry needling treatment.   Patient Verbal Consent Given: Yes Education Handout Provided: Previously Provided Muscles Treated: L QL and L gluteals and piriformis Electrical Stimulation Performed: No Treatment Response/Outcome: Utilized skilled  palpation to identify bony landmarks and trigger points.  Able to illicit twitch response and muscle elongation.  Soft tissue mobilization to muscles needled following DN to further promote tissue elongation and decreased pain.     04/08/24: Nustep x 5 min Reviewed all stretches: patient needed several vc's for correct technique which could be why he was not getting any relief with the original stretches Added core strengthening as follows: PPT x 20 PPT with 90/90 heel tap PPT with dying bug (patient struggled with unsupported - will likely have to do supported at home) Discussed engaging core and upright posture when doing inclines during his walk Trigger Point Dry Needling Subsequent Treatment: Instructions provided previously at initial dry needling treatment.  Patient Verbal Consent Given: Yes Education Handout Provided: Previously Provided Muscles Treated: lumbar multifidi, left glut max, min and medius, and piriformis Electrical Stimulation Performed: No Treatment Response/Outcome: Skilled palpation used to identify taut bands and trigger points.  Once identified, dry needling techniques used to treat these areas.  Twitch response ellicited along with palpable elongation of muscle.  Following treatment, pt reported some soreness in the gluteal area and mild headache.  Suggested using ice and drinking water consistently today to alleviate these symptoms.     04/06/24: LTR 3x10" bil Fig 4 Lt supine x30" with foot off table Piriformis supine Lt x30" (pull knee to opp shoulder) PPT x10 90/90 alt march in PPT [place] dog - too hard [place] 10x5" holds Prone hip ext x 8 each - much harder Lt SL hip abd x10 - much harder Lt Sit to stand from mat table red loop at knees with 10lb OH press x 10 Sidestepping and monster walks red loop at thighs (needs black band next time) 8" fwd step up Lt LE with Rt LE march to 90/90 x10x5" 8" fwd step up Lt LE with double red tband shoulder extension Rt UE x8 -  balance challenged with this Hip matrix 55lb hip abd  and ext x10 each bil RPE 3/10 today Prone lying: central PA and UPA Lt L5/S1 Gr II-IV Prone press ups for lumbar extension  Standing lumbar ext with thumbs pressing in at L5    03/15/24  Initial eval completed and initiated HEP Educated on anatomy of the lumbar spine, proper posture and body mechanics Suggested ice/heat for pain control and to continue walking on good days if he is able.     Trigger Point Dry Needling Initial Treatment: Pt instructed on Dry Needling rational, procedures, and possible side effects. Pt instructed to expect mild to moderate muscle soreness later in the day and/or into the next day.  Pt instructed in methods to reduce muscle soreness. Pt instructed to continue prescribed HEP. Because Dry Needling was performed over or adjacent to a lung field, pt was educated on S/S of pneumothorax and to seek immediate medical attention should they occur.  Patient was educated on signs and symptoms of infection and other risk factors and advised to seek medical attention should they occur.  Patient verbalized understanding of these instructions and education.  Patient Verbal Consent Given: Yes Education Handout Provided: Yes Muscles Treated: left glut max, min and medius and piriformis Electrical Stimulation Performed: No Treatment Response/Outcome: Skilled palpation used to identify taut bands and trigger points.  Once identified, dry needling techniques used to treat these areas.  Twitch response ellicited along with palpable elongation of muscle.  Following treatment, patient reported mild soreness.                                                                                                                     PATIENT EDUCATION:  Education details: See above, also initiated HEP, also educated on dry needling  Person educated: Patient Education method: Explanation, Demonstration, Verbal cues, and Handouts Education  comprehension: verbalized understanding, returned demonstration, and verbal cues required  HOME EXERCISE PROGRAM: Access Code: [email or web address] URL: [email or web address] Date: 04/06/2024 Prepared by: Minor Amble Beuhring  Exercises - Standing Hamstring Stretch on Chair  - 1 x daily - 7 x weekly - 1 sets - 3 reps - 30 sec hold - Quadricep Stretch with Chair and Counter Support  - 1 x daily - 7 x weekly - 1 sets - 3 reps - 30 sec hold - Seated Figure 4 Piriformis Stretch  - 1 x daily - 7 x weekly - 1 sets - 3 reps - 30 sed hold - Foam Roll 90/90 March  - 1 x daily - 7 x weekly - 1 sets - 10 reps - Squat with Chair Touch and Resistance Loop  - 1 x daily - 7 x weekly - 2 sets - 10 reps - Hip Abduction with Resistance Loop  - 1 x daily - 7 x weekly - 2 sets - 10 reps - Hip Extension with Resistance Loop  - 1 x daily - 7 x weekly - 2 sets - 10 reps - Standing Lumbar Extension  - 1 x daily - 7 x weekly - 1 sets -  10 reps - Standing Lumbar Extension with Counter  - 1 x daily - 7 x weekly - 1 sets - 10 reps  ASSESSMENT:  CLINICAL IMPRESSION: Patient reports ongoing L hip pain with walking and also new onset of LBP since doing dying bug at home. He thinks the DN may be helping, but the pain is still there. He was very tight in the L QL today which was addressed with manual therapy and DN. Good muscle elongation resulted here and in gluteals. Reviewed dying bug and modified to leg extension at 45 deg only. His abs fatigued right away with dying bug, but 90/90 heel taps were no problem. Even pelvic landmarks noted today. He has not met any of his STGs at this time except for being independent with his HEP. He continues to demonstrate potential for improvement and would benefit from continued skilled therapy to address impairments.    OBJECTIVE IMPAIRMENTS: Abnormal gait, decreased mobility, difficulty walking, decreased strength, increased fascial restrictions, increased muscle spasms, impaired  flexibility, postural dysfunction, and pain.   ACTIVITY LIMITATIONS: lifting, bending, standing, squatting, sleeping, stairs, transfers, bed mobility, bathing, and dressing  PARTICIPATION LIMITATIONS: meal prep, cleaning, laundry, driving, shopping, community activity, and yard work  PERSONAL FACTORS: Age, Past/current experiences, Time since onset of injury/illness/exacerbation, and 1-2 comorbidities: OA and Htn are also affecting patient's functional outcome.   REHAB POTENTIAL: Good  CLINICAL DECISION MAKING: Stable/uncomplicated  EVALUATION COMPLEXITY: Low   GOALS: Goals reviewed with patient? Yes  SHORT TERM GOALS: Target date: 04/12/2024  Pain report to be no greater than 4/10  Baseline: Goal status: IN PROGRESS 04/11/24  2.  Patient will be independent with initial HEP  Baseline:  Goal status: MET  3.  Patient to report 50% reduction in overall symptoms  Baseline:  Goal status: IN PROGRESS 04/11/24  4.  Patient to be able to walk at least 1/2 mile without increase in pain Baseline:  Goal status: IN PROGRESS 04/11/24 1/4 MILE   LONG TERM GOALS: Target date: 05/10/2024  Patient to report pain no greater than 2/10  Baseline:  Goal status: INITIAL  2.  Patient to be independent with advanced HEP  Baseline:  Goal status: INITIAL  3.  Patient to report 85% improvement in overall symptoms  Baseline:  Goal status: INITIAL  4.  Patient to be able to stand or walk for at least 15 min without left low back and buttock pain Baseline:  Goal status: INITIAL  5.  Patient to demonstrate improved posture including awareness of lumbar lordosis  Baseline:  Goal status: INITIAL  6.  Patient to be able to resume his prior level of activity/exercise Baseline:  Goal status: INITIAL  PLAN:  PT FREQUENCY: 1-2x/week  PT DURATION: 8 weeks  PLANNED INTERVENTIONS: 97110-Therapeutic exercises, 97530- Therapeutic activity, [id number]- Neuromuscular re-education, 97535- Self Care,  97140- Manual therapy, [phone]- Gait training, 60454- Aquatic Therapy, [phone]- Electrical stimulation (unattended), [id number]- Electrical stimulation (manual), [id number]- Ultrasound, [id number]- Traction (mechanical), [id number]- Ionotophoresis 4mg /ml Dexamethasone , Patient/Family education, Balance training, Stair training, Taping, Dry Needling, Joint mobilization, Spinal mobilization, Cryotherapy, and Moist heat.  PLAN FOR NEXT SESSION: Review HEP, Nustep, assess response to DN#2, progress core and  hip stability training and core strengthening.     Jinx Mourning, PT 04/11/24 11:32 AM [facility] [address], [address] [place], [place] [address] Phone # [phone] Fax [phone]

## 2024-04-11 ENCOUNTER — Encounter: Payer: Self-pay | Admitting: Physical Therapy

## 2024-04-11 ENCOUNTER — Ambulatory Visit: Admitting: Physical Therapy

## 2024-04-11 DIAGNOSIS — R262 Difficulty in walking, not elsewhere classified: Secondary | ICD-10-CM

## 2024-04-11 DIAGNOSIS — R252 Cramp and spasm: Secondary | ICD-10-CM

## 2024-04-11 DIAGNOSIS — M25552 Pain in left hip: Secondary | ICD-10-CM

## 2024-04-11 DIAGNOSIS — R293 Abnormal posture: Secondary | ICD-10-CM

## 2024-04-11 DIAGNOSIS — M62838 Other muscle spasm: Secondary | ICD-10-CM

## 2024-04-11 DIAGNOSIS — M6281 Muscle weakness (generalized): Secondary | ICD-10-CM

## 2024-04-13 ENCOUNTER — Ambulatory Visit

## 2024-04-13 DIAGNOSIS — M25552 Pain in left hip: Secondary | ICD-10-CM

## 2024-04-13 DIAGNOSIS — M6281 Muscle weakness (generalized): Secondary | ICD-10-CM

## 2024-04-13 DIAGNOSIS — R262 Difficulty in walking, not elsewhere classified: Secondary | ICD-10-CM

## 2024-04-13 DIAGNOSIS — R293 Abnormal posture: Secondary | ICD-10-CM

## 2024-04-13 DIAGNOSIS — R252 Cramp and spasm: Secondary | ICD-10-CM

## 2024-04-13 NOTE — Therapy (Signed)
 OUTPATIENT PHYSICAL THERAPY THORACOLUMBAR TREATMENT   Patient Name: Willie Burton MRN: [id number] DOB:February 14, 1946, 78 y.o., male Today's Date: 04/13/2024  END OF SESSION:  PT End of Session - 04/13/24 1022     Visit Number 5    Date for PT Re-Evaluation 05/10/24    Authorization Type Medicare    Progress Note Due on Visit 10    PT Start Time 1015    PT Stop Time 1108    PT Time Calculation (min) 53 min    Activity Tolerance Patient tolerated treatment well    Behavior During Therapy WFL for tasks assessed/performed               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   Arthritis    Colon polyp    Coronary artery disease    Diverticula of colon    Dysrhythmia    PACs, PVCs   Heart disease    High blood pressure    High cholesterol    Hyperlipemia    Peripheral vascular disease (HCC)    Prostate cancer (HCC)    Past Surgical History:  Procedure Laterality Date   ABDOMINAL AORTOGRAM W/LOWER EXTREMITY N/A 10/16/2017   Procedure: ABDOMINAL AORTOGRAM W/LOWER EXTREMITY;  Surgeon: Richrd Char, MD;  Location: [facility];  Service: Cardiovascular;  Laterality: N/A;   APPENDECTOMY     CARDIAC CATHETERIZATION N/A 11/13/2015   Procedure: Left Heart Cath and Coronary Angiography;  Surgeon: Ronney Cola, MD;  Location: [facility];  Service: Cardiovascular;  Laterality: N/A;   CATARACT EXTRACTION     COLON SURGERY  2008   right colon resection   COLONOSCOPY WITH PROPOFOL  N/A 01/06/2018   Procedure: COLONOSCOPY WITH PROPOFOL ;  Surgeon: Toledo, Alphonsus Jeans, MD;  Location: [facility] ENDOSCOPY;  Service: Gastroenterology;  Laterality: N/A;   ENDARTERECTOMY POPLITEAL Right 11/11/2017   FEMORAL-POPLITEAL BYPASS GRAFT Right 11/11/2017   Procedure: RIGHT POPLITEAL ENDARTERECTOMY;  Surgeon: Richrd Char, MD;  Location: [facility] OR;  Service: Vascular;  Laterality: Right;   LIPOMA EXCISION Right    thigh    METACARPOPHALANGEAL JOINT ARTHROPLASTY     PATCH ANGIOPLASTY Right 11/11/2017   Procedure: PATCH ANGIOPLASTY of Superficial Fenoral Artery;  Surgeon: Richrd Char, MD;  Location: [facility] OR;  Service: Vascular;  Laterality: Right;   PROSTATECTOMY  2003   ULNAR NERVE TRANSPOSITION Left January 2015   Patient Active Problem List   Diagnosis Date Noted   History of colonic polyps 10/19/2023   Primary hypertension 10/17/2021   PAD (peripheral artery disease) (HCC) 10/09/2021   Hyperlipidemia LDL goal <70 10/08/2021   Other microscopic hematuria 10/08/2021   B12 deficiency 06/07/2021   Arthritis of right hand 04/11/2021   Achilles tendonitis, bilateral 03/16/2020   Bilateral Achilles tendonosis 05/16/2019   Claudication of calf muscles (HCC) 08/11/2017   Chronic bilateral low back pain with right-sided sciatica 08/11/2017   Carotid stenosis 05/05/2017   Presbycusis of both ears 03/20/2017   Pulsatile tinnitus of both ears 03/20/2017   H/O coronary artery bypass surgery 11/20/2015   Atherosclerotic heart disease of native coronary artery without angina pectoris 11/15/2015   HNP (herniated nucleus pulposus), lumbar 11/15/2014   Lumbar radiculitis 11/15/2014   Ulnar neuropathy of left upper extremity 10/13/2014   AC (acromioclavicular) arthritis 04/04/2013   CMC arthritis, thumb, degenerative 06/14/2012    PCP: Azalia Leo, MD  REFERRING PROVIDER: Skeeter Dukes, MD  REFERRING DIAG:  M47.816 (ICD-10-CM) - Spondylosis without myelopathy or radiculopathy, lumbar region  Rationale for Evaluation and Treatment: Rehabilitation  THERAPY DIAG:  Abnormal posture  Cramp and spasm  Difficulty in walking, not elsewhere classified  Muscle weakness (generalized)  Pain in left hip  ONSET DATE: 03/14/2024  SUBJECTIVE:                                                                                                                                                                                            SUBJECTIVE STATEMENT: Patient reports continued pain with the core exercises.  We discussed technique and his tendency to be very aggressive.  He felt like he was doing everything as instructed but just continuing to get pain.    PERTINENT HISTORY:  na  PAIN:  04/13/24 Are you having pain? Yes: NPRS scale: generally 4/10 Pain location: left low back Pain description: aching Aggravating factors: standing, bending Relieving factors: rest, meds  PRECAUTIONS: None  RED FLAGS: None   WEIGHT BEARING RESTRICTIONS: No  FALLS:  Has patient fallen in last 6 months? No  LIVING ENVIRONMENT: Lives with: lives with their spouse Lives in: House/apartment  OCCUPATION: retired Art gallery manager  PLOF: Independent, Independent with basic ADLs, Independent with household mobility without device, Independent with community mobility without device, Independent with homemaking with ambulation, Independent with gait, and Independent with transfers  PATIENT GOALS: He hopes to regain ability to be active and workout.  NEXT MD VISIT: prn  OBJECTIVE:  Note: Objective measures were completed at Evaluation unless otherwise noted.  DIAGNOSTIC FINDINGS:  12/14/2020 IMPRESSION: 1. L5 is numbered as a transitional vertebra to remain consistent with the previous report from December 2015. We would have to count down from the cervical region for true anatomic numbering. 2. T12-L1: No change. Osteophytes and shallow disc protrusion indent the thecal sac but do not appear to cause neural compression. 3. L1-2: No change.  Mild non-compressive spondylosis. 4. L2-3: Endplate osteophytes and bulging of the disc. Mild bilateral lateral recess stenosis but without definite neural compression. Slight worsening since 2018. Chronic discogenic endplate marrow changes, but with some persistent edema, which could be associated with regional back pain 5. L3-4: Newly seen discogenic endplate edema  which could relate to back pain. Endplate osteophytes and bulging of the disc. Left foraminal narrowing that could affect the exiting L3 nerve. This foraminal stenosis is similar to the study of 2018. 6. L4-5: Chronic central disc protrusion with slight caudal down turning. This indents the thecal sac and contacts the S1 root sleeves, more on the left. This is unchanged from previous studies  PATIENT SURVEYS:  Modified Oswestry complete next  visit   COGNITION: Overall cognitive status: Within functional limits for tasks assessed     SENSATION: WFL  MUSCLE LENGTH: Hamstrings: Right 45 deg; Left 50 deg Thomas test: Right pos; Left pos  POSTURE: rounded shoulders, forward head, decreased lumbar lordosis, posterior pelvic tilt, and flexed trunk   PALPATION: Mild scoliosis and trunk shift noted on palpation of lumbar spine and lower back  LUMBAR ROM:   AROM eval  Flexion WFL  Extension WFL  Right lateral flexion WFL  Left lateral flexion WFL  Right rotation WNL  Left rotation WNL   (Blank rows = not tested)  LOWER EXTREMITY ROM:     WFL  LOWER EXTREMITY MMT:    All 4+ to 5/5 bilateral LE's  LUMBAR SPECIAL TESTS:  Straight leg raise test: Negative  FUNCTIONAL TESTS:  5 times sit to stand: 6.49 sec Timed up and go (TUG): 7.48 sec  GAIT: Distance walked: 30 feet Assistive device utilized: None Level of assistance: Complete Independence Comments: short step length but good heel strike, slight trendelenburg on right  TREATMENT DATE:  04/13/24: Nustep x 5 min patient adjusted resistance several times ending on fairly high resistance Quadruped cat/cow x 10 Quadruped alternating legs x 20 Quadruped alternating arms x 20 Prone lying x 2 min Prone on elbows x 2 min Seated 3 direction ball rolls with large physio ball x 10 each direction Prone quad/hip flexor stretch 3 x 30 seconds each side Plank on elbows 3 x 30 sec Ice in hook lying with feet propped x 10  min  04/11/24: Nustep x 6 min SKTC 2 x 30 sec  90/90 heel taps Dying bug - feels in the low back after 2 reps Prone hip ext x 10 B Quadriped with leg ext x 5 ea Quad with alt arm and leg x 3 ea Manual: UPA mobs B lumbar; TPR to L QL Trigger Point Dry Needling  Subsequent Treatment: Instructions provided previously at initial dry needling treatment.   Patient Verbal Consent Given: Yes Education Handout Provided: Previously Provided Muscles Treated: L QL and L gluteals and piriformis Electrical Stimulation Performed: No Treatment Response/Outcome: Utilized skilled palpation to identify bony landmarks and trigger points.  Able to illicit twitch response and muscle elongation.  Soft tissue mobilization to muscles needled following DN to further promote tissue elongation and decreased pain.     04/08/24: Nustep x 5 min Reviewed all stretches: patient needed several vc's for correct technique which could be why he was not getting any relief with the original stretches Added core strengthening as follows: PPT x 20 PPT with 90/90 heel tap PPT with dying bug (patient struggled with unsupported - will likely have to do supported at home) Discussed engaging core and upright posture when doing inclines during his walk Trigger Point Dry Needling Subsequent Treatment: Instructions provided previously at initial dry needling treatment.  Patient Verbal Consent Given: Yes Education Handout Provided: Previously Provided Muscles Treated: lumbar multifidi, left glut max, min and medius, and piriformis Electrical Stimulation Performed: No Treatment Response/Outcome: Skilled palpation used to identify taut bands and trigger points.  Once identified, dry needling techniques used to treat these areas.  Twitch response ellicited along with palpable elongation of muscle.  Following treatment, pt reported some soreness in the gluteal area and mild headache.  Suggested using ice and drinking water consistently  today to alleviate these symptoms.      PATIENT EDUCATION:  Education details: See above, also initiated HEP, also educated on dry needling  Person educated: Patient Education method: Explanation, Demonstration, Verbal cues, and Handouts Education comprehension: verbalized understanding, returned demonstration, and verbal cues required  HOME EXERCISE PROGRAM: Access Code: [email or web address] URL: [email or web address] Date: 04/06/2024 Prepared by: Minor Amble Beuhring  Exercises - Standing Hamstring Stretch on Chair  - 1 x daily - 7 x weekly - 1 sets - 3 reps - 30 sec hold - Quadricep Stretch with Chair and Counter Support  - 1 x daily - 7 x weekly - 1 sets - 3 reps - 30 sec hold - Seated Figure 4 Piriformis Stretch  - 1 x daily - 7 x weekly - 1 sets - 3 reps - 30 sed hold - Foam Roll 90/90 March  - 1 x daily - 7 x weekly - 1 sets - 10 reps - Squat with Chair Touch and Resistance Loop  - 1 x daily - 7 x weekly - 2 sets - 10 reps - Hip Abduction with Resistance Loop  - 1 x daily - 7 x weekly - 2 sets - 10 reps - Hip Extension with Resistance Loop  - 1 x daily - 7 x weekly - 2 sets - 10 reps - Standing Lumbar Extension  - 1 x daily - 7 x weekly - 1 sets - 10 reps - Standing Lumbar Extension with Counter  - 1 x daily - 7 x weekly - 1 sets - 10 reps  ASSESSMENT:  CLINICAL IMPRESSION: Patient does not seem to be responding significantly to PT but does not want to stop PT.  Concerned that he is injuring himself with the core work we have given him.  Did have some response to DN but states he was sore.  Explained to patient that the therapy won't always resolve an issue if the lumbar spacing is too far gone.  He is somewhat erratic in his approach to exercise.  When I put him on the Nustep and adjusted him, he then made multiple new adjustments to resistance and settings before ending at 5 min.  He is typically aggressive and does his reps too fast.  We have discussed making these corrections.   He seems to be doing a little better with this.  He has never tried aquatic therapy so we discussed swapping out several of his land appts for some aquatic visits.  He was on board with this.  We tried quadruped exercises today.  He, again, has discomfort with this but not as much as the supine core work.  Encouraged patient to ice frequently and keep pain level well controlled.    OBJECTIVE IMPAIRMENTS: Abnormal gait, decreased mobility, difficulty walking, decreased strength, increased fascial restrictions, increased muscle spasms, impaired flexibility, postural dysfunction, and pain.   ACTIVITY LIMITATIONS: lifting, bending, standing, squatting, sleeping, stairs, transfers, bed mobility, bathing, and dressing  PARTICIPATION LIMITATIONS: meal prep, cleaning, laundry, driving, shopping, community activity, and yard work  PERSONAL FACTORS: Age, Past/current experiences, Time since onset of injury/illness/exacerbation, and 1-2 comorbidities: OA and Htn are also affecting patient's functional outcome.   REHAB POTENTIAL: Good  CLINICAL DECISION MAKING: Stable/uncomplicated  EVALUATION COMPLEXITY: Low   GOALS: Goals reviewed with patient? Yes  SHORT TERM GOALS: Target date: 04/12/2024  Pain report to be no greater than 4/10  Baseline: Goal status: IN PROGRESS 04/11/24  2.  Patient will be independent with initial HEP  Baseline:  Goal status: MET  3.  Patient to report 50% reduction in overall symptoms  Baseline:  Goal status: IN PROGRESS 04/11/24  4.  Patient to  be able to walk at least 1/2 mile without increase in pain Baseline:  Goal status: IN PROGRESS 04/11/24 1/4 MILE   LONG TERM GOALS: Target date: 05/10/2024  Patient to report pain no greater than 2/10  Baseline:  Goal status: In progress  2.  Patient to be independent with advanced HEP  Baseline:  Goal status: In progress  3.  Patient to report 85% improvement in overall symptoms  Baseline:  Goal status: In  progress  4.  Patient to be able to stand or walk for at least 15 min without left low back and buttock pain Baseline:  Goal status: INITIAL  5.  Patient to demonstrate improved posture including awareness of lumbar lordosis  Baseline:  Goal status: INITIAL  6.  Patient to be able to resume his prior level of activity/exercise Baseline:  Goal status: INITIAL  PLAN:  PT FREQUENCY: 1-2x/week  PT DURATION: 8 weeks  PLANNED INTERVENTIONS: 97110-Therapeutic exercises, 97530- Therapeutic activity, [id number]- Neuromuscular re-education, 97535- Self Care, [id number]- Manual therapy, [phone]- Gait training, [phone]- Aquatic Therapy, [phone]- Electrical stimulation (unattended), [id number]- Electrical stimulation (manual), [id number]- Ultrasound, [id number]- Traction (mechanical), [id number]- Ionotophoresis 4mg /ml Dexamethasone , Patient/Family education, Balance training, Stair training, Taping, Dry Needling, Joint mobilization, Spinal mobilization, Cryotherapy, and Moist heat.  PLAN FOR NEXT SESSION: Patient will be swapping out some of his land appts and doing some aquatic appointments.  Nustep,  DN, progress core and  hip stability training and core strengthening.     Bridgette Campus B. Krystalynn Ridgeway, PT 04/13/24 11:19 AM [facility] [address], [address] [place], [place] [address] Phone # [phone] Fax [phone]

## 2024-04-19 ENCOUNTER — Ambulatory Visit

## 2024-04-19 DIAGNOSIS — R262 Difficulty in walking, not elsewhere classified: Secondary | ICD-10-CM

## 2024-04-19 DIAGNOSIS — R293 Abnormal posture: Secondary | ICD-10-CM

## 2024-04-19 DIAGNOSIS — M6281 Muscle weakness (generalized): Secondary | ICD-10-CM

## 2024-04-19 DIAGNOSIS — M62838 Other muscle spasm: Secondary | ICD-10-CM

## 2024-04-19 DIAGNOSIS — M25552 Pain in left hip: Secondary | ICD-10-CM

## 2024-04-19 DIAGNOSIS — R252 Cramp and spasm: Secondary | ICD-10-CM

## 2024-04-19 DIAGNOSIS — M5459 Other low back pain: Secondary | ICD-10-CM

## 2024-04-19 NOTE — Therapy (Signed)
 OUTPATIENT PHYSICAL THERAPY THORACOLUMBAR TREATMENT   Patient Name: Willie Burton MRN: [id number] DOB:30-Mar-1946, 78 y.o., male Today's Date: 04/19/2024  END OF SESSION:  PT End of Session - 04/19/24 1506     Visit Number 6    Date for PT Re-Evaluation 05/10/24    Authorization Type Medicare    Progress Note Due on Visit 10    PT Start Time 1448    PT Stop Time 1530    PT Time Calculation (min) 42 min    Activity Tolerance Patient tolerated treatment well    Behavior During Therapy WFL for tasks assessed/performed               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   Arthritis    Colon polyp    Coronary artery disease    Diverticula of colon    Dysrhythmia    PACs, PVCs   Heart disease    High blood pressure    High cholesterol    Hyperlipemia    Peripheral vascular disease (HCC)    Prostate cancer (HCC)    Past Surgical History:  Procedure Laterality Date   ABDOMINAL AORTOGRAM W/LOWER EXTREMITY N/A 10/16/2017   Procedure: ABDOMINAL AORTOGRAM W/LOWER EXTREMITY;  Surgeon: Richrd Char, MD;  Location: [facility];  Service: Cardiovascular;  Laterality: N/A;   APPENDECTOMY     CARDIAC CATHETERIZATION N/A 11/13/2015   Procedure: Left Heart Cath and Coronary Angiography;  Surgeon: Ronney Cola, MD;  Location: [facility];  Service: Cardiovascular;  Laterality: N/A;   CATARACT EXTRACTION     COLON SURGERY  2008   right colon resection   COLONOSCOPY WITH PROPOFOL  N/A 01/06/2018   Procedure: COLONOSCOPY WITH PROPOFOL ;  Surgeon: Toledo, Alphonsus Jeans, MD;  Location: [facility] ENDOS[facility];  Service: Gastroenterology;  Laterality: N/A;   ENDARTERECTOMY POPLITEAL Right 11/11/2017   FEMORAL-POPLITEAL BYPASS GRAFT Right 11/11/2017   Procedure: RIGHT POPLITEAL ENDARTERECTOMY;  Surgeon: Richrd Char, MD;  Location: [facility] OR;  Service: Vascular;  Laterality: Right;   LIPOMA EXCISION Right    thigh    METACARPOPHALANGEAL JOINT ARTHROPLASTY     PATCH ANGIOPLASTY Right 11/11/2017   Procedure: PATCH ANGIOPLASTY of Superficial Fenoral Artery;  Surgeon: Richrd Char, MD;  Location: [facility] OR;  Service: Vascular;  Laterality: Right;   PROSTATECTOMY  2003   ULNAR NERVE TRANSPOSITION Left January 2015   Patient Active Problem List   Diagnosis Date Noted   History of colonic polyps 10/19/2023   Primary hypertension 10/17/2021   PAD (peripheral artery disease) (HCC) 10/09/2021   Hyperlipidemia LDL goal <70 10/08/2021   Other microscopic hematuria 10/08/2021   B12 deficiency 06/07/2021   Arthritis of right hand 04/11/2021   Achilles tendonitis, bilateral 03/16/2020   Bilateral Achilles tendonosis 05/16/2019   Claudication of calf muscles (HCC) 08/11/2017   Chronic bilateral low back pain with right-sided sciatica 08/11/2017   Carotid stenosis 05/05/2017   Presbycusis of both ears 03/20/2017   Pulsatile tinnitus of both ears 03/20/2017   H/O coronary artery bypass surgery 11/20/2015   Atherosclerotic heart disease of native coronary artery without angina pectoris 11/15/2015   HNP (herniated nucleus pulposus), lumbar 11/15/2014   Lumbar radiculitis 11/15/2014   Ulnar neuropathy of left upper extremity 10/13/2014   AC (acromioclavicular) arthritis 04/04/2013   CMC arthritis, thumb, degenerative 06/14/2012    PCP: Azalia Leo, MD  REFERRING PROVIDER: Skeeter Dukes, MD  REFERRING DIAG:  M47.816 (ICD-10-CM) - Spondylosis without myelopathy or radiculopathy, lumbar region  Rationale for Evaluation and Treatment: Rehabilitation  THERAPY DIAG:  Other low back pain  Abnormal posture  Cramp and spasm  Difficulty in walking, not elsewhere classified  Muscle weakness (generalized)  Pain in left hip  Other muscle spasm  ONSET DATE: 03/14/2024  SUBJECTIVE:                                                                                                                                                                                            SUBJECTIVE STATEMENT: Patient reports continued pain with the core exercises.  We discussed technique and his tendency to be very aggressive.  He felt like he was doing everything as instructed but just continuing to get pain.    PERTINENT HISTORY:  na  PAIN:  04/13/24 Are you having pain? Yes: NPRS scale: generally 4/10 Pain location: left low back Pain description: aching Aggravating factors: standing, bending Relieving factors: rest, meds  PRECAUTIONS: None  RED FLAGS: None   WEIGHT BEARING RESTRICTIONS: No  FALLS:  Has patient fallen in last 6 months? No  LIVING ENVIRONMENT: Lives with: lives with their spouse Lives in: House/apartment  OCCUPATION: retired Art gallery manager  PLOF: Independent, Independent with basic ADLs, Independent with household mobility without device, Independent with community mobility without device, Independent with homemaking with ambulation, Independent with gait, and Independent with transfers  PATIENT GOALS: He hopes to regain ability to be active and workout.  NEXT MD VISIT: prn  OBJECTIVE:  Note: Objective measures were completed at Evaluation unless otherwise noted.  DIAGNOSTIC FINDINGS:  12/14/2020 IMPRESSION: 1. L5 is numbered as a transitional vertebra to remain consistent with the previous report from December 2015. We would have to count down from the cervical region for true anatomic numbering. 2. T12-L1: No change. Osteophytes and shallow disc protrusion indent the thecal sac but do not appear to cause neural compression. 3. L1-2: No change.  Mild non-compressive spondylosis. 4. L2-3: Endplate osteophytes and bulging of the disc. Mild bilateral lateral recess stenosis but without definite neural compression. Slight worsening since 2018. Chronic discogenic endplate marrow changes, but with some persistent edema, which could be associated with regional back pain 5.  L3-4: Newly seen discogenic endplate edema which could relate to back pain. Endplate osteophytes and bulging of the disc. Left foraminal narrowing that could affect the exiting L3 nerve. This foraminal stenosis is similar to the study of 2018. 6. L4-5: Chronic central disc protrusion with slight caudal down turning. This indents the thecal sac and contacts the S1 root sleeves, more on the left. This is unchanged from previous  studies  PATIENT SURVEYS:  Modified Oswestry complete next visit   COGNITION: Overall cognitive status: Within functional limits for tasks assessed     SENSATION: WFL  MUSCLE LENGTH: Hamstrings: Right 45 deg; Left 50 deg Thomas test: Right pos; Left pos  POSTURE: rounded shoulders, forward head, decreased lumbar lordosis, posterior pelvic tilt, and flexed trunk   PALPATION: Mild scoliosis and trunk shift noted on palpation of lumbar spine and lower back  LUMBAR ROM:   AROM eval  Flexion WFL  Extension WFL  Right lateral flexion WFL  Left lateral flexion WFL  Right rotation WNL  Left rotation WNL   (Blank rows = not tested)  LOWER EXTREMITY ROM:     WFL  LOWER EXTREMITY MMT:    All 4+ to 5/5 bilateral LE's  LUMBAR SPECIAL TESTS:  Straight leg raise test: Negative  FUNCTIONAL TESTS:  5 times sit to stand: 6.49 sec Timed up and go (TUG): 7.48 sec  GAIT: Distance walked: 30 feet Assistive device utilized: None Level of assistance: Complete Independence Comments: short step length but good heel strike, slight trendelenburg on right  TREATMENT DATE:  04/19/24: Recumbent bike level 6 x 5 min Hamstring stretch 3 x 30 sec each LE Quad/hip flexor stretch 3 x 30 sec (3 airex pads in chair) Plank 2 x 30 sec Plank with hip dips attempted (able to do 2 x 10 but some pain with this) Educated on modifications to current workout regimen and his aggressive approach to exercises foregoing form at times Physio ball march x 20 Physio ball alternating  arms and legs x 20 Squats on BOSU with bilateral UE support 2 x 10 Offered ice: patient declined.   04/13/24: Nustep x 5 min patient adjusted resistance several times ending on fairly high resistance Quadruped cat/cow x 10 Quadruped alternating legs x 20 Quadruped alternating arms x 20 Prone lying x 2 min Prone on elbows x 2 min Seated 3 direction ball rolls with large physio ball x 10 each direction Prone quad/hip flexor stretch 3 x 30 seconds each side Plank on elbows 3 x 30 sec Ice in hook lying with feet propped x 10 min  04/11/24: Nustep x 6 min SKTC 2 x 30 sec  90/90 heel taps Dying bug - feels in the low back after 2 reps Prone hip ext x 10 B Quadriped with leg ext x 5 ea Quad with alt arm and leg x 3 ea Manual: UPA mobs B lumbar; TPR to L QL Trigger Point Dry Needling  Subsequent Treatment: Instructions provided previously at initial dry needling treatment.   Patient Verbal Consent Given: Yes Education Handout Provided: Previously Provided Muscles Treated: L QL and L gluteals and piriformis Electrical Stimulation Performed: No Treatment Response/Outcome: Utilized skilled palpation to identify bony landmarks and trigger points.  Able to illicit twitch response and muscle elongation.  Soft tissue mobilization to muscles needled following DN to further promote tissue elongation and decreased pain.     PATIENT EDUCATION:  Education details: See above, also initiated HEP, also educated on dry needling  Person educated: Patient Education method: Explanation, Demonstration, Verbal cues, and Handouts Education comprehension: verbalized understanding, returned demonstration, and verbal cues required  HOME EXERCISE PROGRAM: Access Code: [email or web address] URL: [email or web address] Date: 04/06/2024 Prepared by: Raynell Caller  Exercises - Standing Hamstring Stretch on Chair  - 1 x daily - 7 x weekly - 1 sets - 3 reps - 30 sec hold - Theatre manager with Chair and  Counter Support  -  1 x daily - 7 x weekly - 1 sets - 3 reps - 30 sec hold - Seated Figure 4 Piriformis Stretch  - 1 x daily - 7 x weekly - 1 sets - 3 reps - 30 sed hold - Foam Roll 90/90 March  - 1 x daily - 7 x weekly - 1 sets - 10 reps - Squat with Chair Touch and Resistance Loop  - 1 x daily - 7 x weekly - 2 sets - 10 reps - Hip Abduction with Resistance Loop  - 1 x daily - 7 x weekly - 2 sets - 10 reps - Hip Extension with Resistance Loop  - 1 x daily - 7 x weekly - 2 sets - 10 reps - Standing Lumbar Extension  - 1 x daily - 7 x weekly - 1 sets - 10 reps - Standing Lumbar Extension with Counter  - 1 x daily - 7 x weekly - 1 sets - 10 reps  ASSESSMENT:  CLINICAL IMPRESSION: Willie Burton seems to have had a slight breakthrough on his overall discomfort in the past few days.  He would benefit from continued skilled PT for core strength and LE flexibility.   OBJECTIVE IMPAIRMENTS: Abnormal gait, decreased mobility, difficulty walking, decreased strength, increased fascial restrictions, increased muscle spasms, impaired flexibility, postural dysfunction, and pain.   ACTIVITY LIMITATIONS: lifting, bending, standing, squatting, sleeping, stairs, transfers, bed mobility, bathing, and dressing  PARTICIPATION LIMITATIONS: meal prep, cleaning, laundry, driving, shopping, community activity, and yard work  PERSONAL FACTORS: Age, Past/current experiences, Time since onset of injury/illness/exacerbation, and 1-2 comorbidities: OA and Htn are also affecting patient's functional outcome.   REHAB POTENTIAL: Good  CLINICAL DECISION MAKING: Stable/uncomplicated  EVALUATION COMPLEXITY: Low   GOALS: Goals reviewed with patient? Yes  SHORT TERM GOALS: Target date: 04/12/2024  Pain report to be no greater than 4/10  Baseline: Goal status: IN PROGRESS 04/11/24  2.  Patient will be independent with initial HEP  Baseline:  Goal status: MET  3.  Patient to report 50% reduction in overall symptoms   Baseline:  Goal status: IN PROGRESS 04/11/24  4.  Patient to be able to walk at least 1/2 mile without increase in pain Baseline:  Goal status: IN PROGRESS 04/11/24 1/4 MILE   LONG TERM GOALS: Target date: 05/10/2024  Patient to report pain no greater than 2/10  Baseline:  Goal status: In progress  2.  Patient to be independent with advanced HEP  Baseline:  Goal status: In progress  3.  Patient to report 85% improvement in overall symptoms  Baseline:  Goal status: In progress  4.  Patient to be able to stand or walk for at least 15 min without left low back and buttock pain Baseline:  Goal status: INITIAL  5.  Patient to demonstrate improved posture including awareness of lumbar lordosis  Baseline:  Goal status: INITIAL  6.  Patient to be able to resume his prior level of activity/exercise Baseline:  Goal status: INITIAL  PLAN:  PT FREQUENCY: 1-2x/week  PT DURATION: 8 weeks  PLANNED INTERVENTIONS: 97110-Therapeutic exercises, 97530- Therapeutic activity, [id number]- Neuromuscular re-education, 97535- Self Care, [id number]- Manual therapy, [phone]- Gait training, [phone]- Aquatic Therapy, [phone]- Electrical stimulation (unattended), [id number]- Electrical stimulation (manual), [id number]- Ultrasound, [id number]- Traction (mechanical), [id number]- Ionotophoresis 4mg /ml Dexamethasone , Patient/Family education, Balance training, Stair training, Taping, Dry Needling, Joint mobilization, Spinal mobilization, Cryotherapy, and Moist heat.  PLAN FOR NEXT SESSION: Patient will be swapping out some of his land appts and doing some aquatic appointments.  Nustep,  DN, progress core and  hip stability training and core strengthening.     Bridgette Campus B. Shaheen Star, PT 04/19/24 4:17 PM [facility] [address], [address] [place], [place] [address] Phone # [phone] Fax [phone]

## 2024-04-21 ENCOUNTER — Ambulatory Visit

## 2024-04-21 DIAGNOSIS — R293 Abnormal posture: Secondary | ICD-10-CM

## 2024-04-21 DIAGNOSIS — M6281 Muscle weakness (generalized): Secondary | ICD-10-CM

## 2024-04-21 DIAGNOSIS — R262 Difficulty in walking, not elsewhere classified: Secondary | ICD-10-CM

## 2024-04-21 DIAGNOSIS — R252 Cramp and spasm: Secondary | ICD-10-CM

## 2024-04-21 DIAGNOSIS — M5459 Other low back pain: Secondary | ICD-10-CM

## 2024-04-21 DIAGNOSIS — M25552 Pain in left hip: Secondary | ICD-10-CM

## 2024-04-21 NOTE — Therapy (Signed)
 OUTPATIENT PHYSICAL THERAPY THORACOLUMBAR TREATMENT   Patient Name: Willie Burton MRN: [id number] DOB:1946-10-29, 78 y.o., male Today's Date: 04/21/2024  END OF SESSION:  PT End of Session - 04/21/24 1021     Visit Number 7    Date for PT Re-Evaluation 05/10/24    Authorization Type Medicare    Progress Note Due on Visit 10    PT Start Time 1017    PT Stop Time 1052    PT Time Calculation (min) 35 min    Activity Tolerance Patient tolerated treatment well    Behavior During Therapy WFL for tasks assessed/performed               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   Arthritis    Colon polyp    Coronary artery disease    Diverticula of colon    Dysrhythmia    PACs, PVCs   Heart disease    High blood pressure    High cholesterol    Hyperlipemia    Peripheral vascular disease (HCC)    Prostate cancer (HCC)    Past Surgical History:  Procedure Laterality Date   ABDOMINAL AORTOGRAM W/LOWER EXTREMITY N/A 10/16/2017   Procedure: ABDOMINAL AORTOGRAM W/LOWER EXTREMITY;  Surgeon: Richrd Char, MD;  Location: [facility];  Service: Cardiovascular;  Laterality: N/A;   APPENDECTOMY     CARDIAC CATHETERIZATION N/A 11/13/2015   Procedure: Left Heart Cath and Coronary Angiography;  Surgeon: Ronney Cola, MD;  Location: [facility];  Service: Cardiovascular;  Laterality: N/A;   CATARACT EXTRACTION     COLON SURGERY  2008   right colon resection   COLONOSCOPY WITH PROPOFOL  N/A 01/06/2018   Procedure: COLONOSCOPY WITH PROPOFOL ;  Surgeon: Toledo, Alphonsus Jeans, MD;  Location: [facility] ENDOSCOPY;  Service: Gastroenterology;  Laterality: N/A;   ENDARTERECTOMY POPLITEAL Right 11/11/2017   FEMORAL-POPLITEAL BYPASS GRAFT Right 11/11/2017   Procedure: RIGHT POPLITEAL ENDARTERECTOMY;  Surgeon: Richrd Char, MD;  Location: [facility] OR;  Service: Vascular;  Laterality: Right;   LIPOMA EXCISION Right    thigh    METACARPOPHALANGEAL JOINT ARTHROPLASTY     PATCH ANGIOPLASTY Right 11/11/2017   Procedure: PATCH ANGIOPLASTY of Superficial Fenoral Artery;  Surgeon: Richrd Char, MD;  Location: [facility];  Service: Vascular;  Laterality: Right;   PROSTATECTOMY  2003   ULNAR NERVE TRANSPOSITION Left January 2015   Patient Active Problem List   Diagnosis Date Noted   History of colonic polyps 10/19/2023   Primary hypertension 10/17/2021   PAD (peripheral artery disease) (HCC) 10/09/2021   Hyperlipidemia LDL goal <70 10/08/2021   Other microscopic hematuria 10/08/2021   B12 deficiency 06/07/2021   Arthritis of right hand 04/11/2021   Achilles tendonitis, bilateral 03/16/2020   Bilateral Achilles tendonosis 05/16/2019   Claudication of calf muscles (HCC) 08/11/2017   Chronic bilateral low back pain with right-sided sciatica 08/11/2017   Carotid stenosis 05/05/2017   Presbycusis of both ears 03/20/2017   Pulsatile tinnitus of both ears 03/20/2017   H/O coronary artery bypass surgery 11/20/2015   Atherosclerotic heart disease of native coronary artery without angina pectoris 11/15/2015   HNP (herniated nucleus pulposus), lumbar 11/15/2014   Lumbar radiculitis 11/15/2014   Ulnar neuropathy of left upper extremity 10/13/2014   AC (acromioclavicular) arthritis 04/04/2013   CMC arthritis, thumb, degenerative 06/14/2012    PCP: Azalia Leo, MD  REFERRING PROVIDER: Skeeter Dukes, MD  REFERRING DIAG:  M47.816 (ICD-10-CM) - Spondylosis without myelopathy or radiculopathy, lumbar region  Rationale for Evaluation and Treatment: Rehabilitation  THERAPY DIAG:  Other low back pain  Abnormal posture  Cramp and spasm  Muscle weakness (generalized)  Difficulty in walking, not elsewhere classified  Pain in left hip  ONSET DATE: 03/14/2024  SUBJECTIVE:                                                                                                                                                                                            SUBJECTIVE STATEMENT: Patient reports "no change".     PERTINENT HISTORY:  na  PAIN:  04/21/24 Are you having pain? Yes: NPRS scale: generally 4/10 Pain location: left low back Pain description: aching Aggravating factors: standing, bending Relieving factors: rest, meds  PRECAUTIONS: None  RED FLAGS: None   WEIGHT BEARING RESTRICTIONS: No  FALLS:  Has patient fallen in last 6 months? No  LIVING ENVIRONMENT: Lives with: lives with their spouse Lives in: House/apartment  OCCUPATION: retired Art gallery manager  PLOF: Independent, Independent with basic ADLs, Independent with household mobility without device, Independent with community mobility without device, Independent with homemaking with ambulation, Independent with gait, and Independent with transfers  PATIENT GOALS: He hopes to regain ability to be active and workout.  NEXT MD VISIT: prn  OBJECTIVE:  Note: Objective measures were completed at Evaluation unless otherwise noted.  DIAGNOSTIC FINDINGS:  12/14/2020 IMPRESSION: 1. L5 is numbered as a transitional vertebra to remain consistent with the previous report from December 2015. We would have to count down from the cervical region for true anatomic numbering. 2. T12-L1: No change. Osteophytes and shallow disc protrusion indent the thecal sac but do not appear to cause neural compression. 3. L1-2: No change.  Mild non-compressive spondylosis. 4. L2-3: Endplate osteophytes and bulging of the disc. Mild bilateral lateral recess stenosis but without definite neural compression. Slight worsening since 2018. Chronic discogenic endplate marrow changes, but with some persistent edema, which could be associated with regional back pain 5. L3-4: Newly seen discogenic endplate edema which could relate to back pain. Endplate osteophytes and bulging of the disc. Left foraminal narrowing that could affect the exiting L3 nerve.  This foraminal stenosis is similar to the study of 2018. 6. L4-5: Chronic central disc protrusion with slight caudal down turning. This indents the thecal sac and contacts the S1 root sleeves, more on the left. This is unchanged from previous studies  PATIENT SURVEYS:  Modified Oswestry complete next visit   COGNITION: Overall cognitive status: Within functional limits for tasks assessed     SENSATION: WFL  MUSCLE LENGTH: Hamstrings: Right 45  deg; Left 50 deg Thomas test: Right pos; Left pos  POSTURE: rounded shoulders, forward head, decreased lumbar lordosis, posterior pelvic tilt, and flexed trunk   PALPATION: Mild scoliosis and trunk shift noted on palpation of lumbar spine and lower back  LUMBAR ROM:   AROM eval  Flexion WFL  Extension WFL  Right lateral flexion WFL  Left lateral flexion WFL  Right rotation WNL  Left rotation WNL   (Blank rows = not tested)  LOWER EXTREMITY ROM:     WFL  LOWER EXTREMITY MMT:    All 4+ to 5/5 bilateral LE's  LUMBAR SPECIAL TESTS:  Straight leg raise test: Negative  FUNCTIONAL TESTS:  5 times sit to stand: 6.49 sec Timed up and go (TUG): 7.48 sec  GAIT: Distance walked: 30 feet Assistive device utilized: None Level of assistance: Complete Independence Comments: short step length but good heel strike, slight trendelenburg on right  TREATMENT DATE:  04/21/24: Recumbent bike level 6 x 5 min Iron cross stretch x 5 each side 10 sec hold (PT assisting with holding foot for more isolated stretch of IT band and glut) Combo IR/ER hip stretch in hook lying x 5 each side with 10 sec hold Supine clamshell with red loop x 20 Side lying clam with red loop x 20 Side lying reverse clam with red loop 2 x 10 Hamstring curl using red physio ball 2 x 10 (offered easier version with heels digging in and just pulling to you or in bridge and do the curl - he opted for the harder option but struggled) Quadruped bird dog x 20  alternating Trigger Point Dry Needling Subsequent Treatment: Instructions provided previously at initial dry needling treatment.  Patient Verbal Consent Given: Yes Education Handout Provided: Previously Provided Muscles Treated: lumbar multifidi left, left glut max, min and med, piriformis (all on left) Electrical Stimulation Performed: No Treatment Response/Outcome: Skilled palpation used to identify taut bands and trigger points.  Once identified, dry needling techniques used to treat these areas.  Obvious twitch response ellicited at glut max along with palpable elongation of muscle and deep ache with all other muscle groups .  Following treatment, patient reported soreness.     04/19/24: Recumbent bike level 6 x 5 min Hamstring stretch 3 x 30 sec each LE Quad/hip flexor stretch 3 x 30 sec (3 airex pads in chair) Plank 2 x 30 sec Plank with hip dips attempted (able to do 2 x 10 but some pain with this) Educated on modifications to current workout regimen and his aggressive approach to exercises foregoing form at times Physio ball march x 20 Physio ball alternating arms and legs x 20 Squats on BOSU with bilateral UE support 2 x 10 Offered ice: patient declined.   04/13/24: Nustep x 5 min patient adjusted resistance several times ending on fairly high resistance Quadruped cat/cow x 10 Quadruped alternating legs x 20 Quadruped alternating arms x 20 Prone lying x 2 min Prone on elbows x 2 min Seated 3 direction ball rolls with large physio ball x 10 each direction Prone quad/hip flexor stretch 3 x 30 seconds each side Plank on elbows 3 x 30 sec Ice in hook lying with feet propped x 10 min    PATIENT EDUCATION:  Education details: See above, also initiated HEP, also educated on dry needling  Person educated: Patient Education method: Programmer, multimedia, Demonstration, Verbal cues, and Handouts Education comprehension: verbalized understanding, returned demonstration, and verbal cues  required  HOME EXERCISE PROGRAM: Access Code: [email or web address]  URL: [email or web address] Date: 04/06/2024 Prepared by: Minor Amble Beuhring  Exercises - Standing Hamstring Stretch on Chair  - 1 x daily - 7 x weekly - 1 sets - 3 reps - 30 sec hold - Quadricep Stretch with Chair and Counter Support  - 1 x daily - 7 x weekly - 1 sets - 3 reps - 30 sec hold - Seated Figure 4 Piriformis Stretch  - 1 x daily - 7 x weekly - 1 sets - 3 reps - 30 sed hold - Foam Roll 90/90 March  - 1 x daily - 7 x weekly - 1 sets - 10 reps - Squat with Chair Touch and Resistance Loop  - 1 x daily - 7 x weekly - 2 sets - 10 reps - Hip Abduction with Resistance Loop  - 1 x daily - 7 x weekly - 2 sets - 10 reps - Hip Extension with Resistance Loop  - 1 x daily - 7 x weekly - 2 sets - 10 reps - Standing Lumbar Extension  - 1 x daily - 7 x weekly - 1 sets - 10 reps - Standing Lumbar Extension with Counter  - 1 x daily - 7 x weekly - 1 sets - 10 reps  ASSESSMENT:  CLINICAL IMPRESSION: Joh continues to come in with report of "no change".  But when discussing DC due to lack of progress, he states he does feel like it's making some difference and he wants to continue to explore treatment options.  We focused on hip strength and stability today.  He was able to do all tasks with a fair amount of ease.  He tends to want to do the most aggressive option on each exercise, despite his limited core strength.  We discussed his tendency to be overly aggressive with his exercise regimen and how this could be contributing to his chronic irritation to the area of his back where there are osteophytes.  He did have heavy twitch and deep ache responses with DN today.  We will proceed based on patients subjective report.  He would benefit from continuing skilled PT to progress toward goals stated below.   OBJECTIVE IMPAIRMENTS: Abnormal gait, decreased mobility, difficulty walking, decreased strength, increased fascial restrictions,  increased muscle spasms, impaired flexibility, postural dysfunction, and pain.   ACTIVITY LIMITATIONS: lifting, bending, standing, squatting, sleeping, stairs, transfers, bed mobility, bathing, and dressing  PARTICIPATION LIMITATIONS: meal prep, cleaning, laundry, driving, shopping, community activity, and yard work  PERSONAL FACTORS: Age, Past/current experiences, Time since onset of injury/illness/exacerbation, and 1-2 comorbidities: OA and Htn are also affecting patient's functional outcome.   REHAB POTENTIAL: Good  CLINICAL DECISION MAKING: Stable/uncomplicated  EVALUATION COMPLEXITY: Low   GOALS: Goals reviewed with patient? Yes  SHORT TERM GOALS: Target date: 04/12/2024  Pain report to be no greater than 4/10  Baseline: Goal status: IN PROGRESS 04/11/24  2.  Patient will be independent with initial HEP  Baseline:  Goal status: MET  3.  Patient to report 50% reduction in overall symptoms  Baseline:  Goal status: IN PROGRESS 04/11/24  4.  Patient to be able to walk at least 1/2 mile without increase in pain Baseline:  Goal status: IN PROGRESS 04/11/24 1/4 MILE   LONG TERM GOALS: Target date: 05/10/2024  Patient to report pain no greater than 2/10  Baseline:  Goal status: In progress  2.  Patient to be independent with advanced HEP  Baseline:  Goal status: In progress  3.  Patient to report 85% improvement  in overall symptoms  Baseline:  Goal status: In progress  4.  Patient to be able to stand or walk for at least 15 min without left low back and buttock pain Baseline:  Goal status: INITIAL  5.  Patient to demonstrate improved posture including awareness of lumbar lordosis  Baseline:  Goal status: INITIAL  6.  Patient to be able to resume his prior level of activity/exercise Baseline:  Goal status: INITIAL  PLAN:  PT FREQUENCY: 1-2x/week  PT DURATION: 8 weeks  PLANNED INTERVENTIONS: 97110-Therapeutic exercises, 97530- Therapeutic activity, [id number]-  Neuromuscular re-education, 97535- Self Care, 24401- Manual therapy, [phone]- Gait training, [phone]- Aquatic Therapy, [phone]- Electrical stimulation (unattended), [id number]- Electrical stimulation (manual), [id number]- Ultrasound, [id number]- Traction (mechanical), [id number]- Ionotophoresis 4mg /ml Dexamethasone , Patient/Family education, Balance training, Stair training, Taping, Dry Needling, Joint mobilization, Spinal mobilization, Cryotherapy, and Moist heat.  PLAN FOR NEXT SESSION: Patient will be swapping out some of his land appts and doing some aquatic appointments.  Nustep,  DN, progress core and  hip stability training and core strengthening.     [facility] B. Zaydah Nawabi, PT 04/21/24 11:03 AM [facility] [address], [address] [place], [place] [address] Phone # [phone] Fax [phone]

## 2024-04-26 ENCOUNTER — Ambulatory Visit

## 2024-04-26 DIAGNOSIS — R293 Abnormal posture: Secondary | ICD-10-CM

## 2024-04-26 DIAGNOSIS — M6281 Muscle weakness (generalized): Secondary | ICD-10-CM

## 2024-04-26 DIAGNOSIS — R262 Difficulty in walking, not elsewhere classified: Secondary | ICD-10-CM

## 2024-04-26 DIAGNOSIS — M5459 Other low back pain: Secondary | ICD-10-CM

## 2024-04-26 DIAGNOSIS — M25552 Pain in left hip: Secondary | ICD-10-CM | POA: Diagnosis not present

## 2024-04-26 DIAGNOSIS — R252 Cramp and spasm: Secondary | ICD-10-CM

## 2024-04-26 NOTE — Therapy (Signed)
 OUTPATIENT PHYSICAL THERAPY THORACOLUMBAR TREATMENT   Patient Name: Willie Burton MRN: [id number] DOB:Sep 20, 1946, 78 y.o., male Today's Date: 04/26/2024  END OF SESSION:  PT End of Session - 04/26/24 1100     Visit Number 8    Date for PT Re-Evaluation 05/10/24    Authorization Type Medicare    Progress Note Due on Visit 10    PT Start Time 1017    PT Stop Time 1102    PT Time Calculation (min) 45 min    Activity Tolerance Patient tolerated treatment well    Behavior During Therapy WFL for tasks assessed/performed                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   Arthritis    Colon polyp    Coronary artery disease    Diverticula of colon    Dysrhythmia    PACs, PVCs   Heart disease    High blood pressure    High cholesterol    Hyperlipemia    Peripheral vascular disease (HCC)    Prostate cancer (HCC)    Past Surgical History:  Procedure Laterality Date   ABDOMINAL AORTOGRAM W/LOWER EXTREMITY N/A 10/16/2017   Procedure: ABDOMINAL AORTOGRAM W/LOWER EXTREMITY;  Surgeon: Richrd Char, MD;  Location: [facility];  Service: Cardiovascular;  Laterality: N/A;   APPENDECTOMY     CARDIAC CATHETERIZATION N/A 11/13/2015   Procedure: Left Heart Cath and Coronary Angiography;  Surgeon: Ronney Cola, MD;  Location: [facility];  Service: Cardiovascular;  Laterality: N/A;   CATARACT EXTRACTION     COLON SURGERY  2008   right colon resection   COLONOSCOPY WITH PROPOFOL  N/A 01/06/2018   Procedure: COLONOSCOPY WITH PROPOFOL ;  Surgeon: Toledo, Alphonsus Jeans, MD;  Location: [facility]OS[facility];  Service: Gastroenterology;  Laterality: N/A;   ENDARTERECTOMY POPLITEAL Right 11/11/2017   FEMORAL-POPLITEAL BYPASS GRAFT Right 11/11/2017   Procedure: RIGHT POPLITEAL ENDARTERECTOMY;  Surgeon: Richrd Char, MD;  Location: [facility] OR;  Service: Vascular;  Laterality: Right;   LIPOMA EXCISION Right    thigh    METACARPOPHALANGEAL JOINT ARTHROPLASTY     PATCH ANGIOPLASTY Right 11/11/2017   Procedure: PATCH ANGIOPLASTY of Superficial Fenoral Artery;  Surgeon: Richrd Char, MD;  Location: [facility] OR;  Service: Vascular;  Laterality: Right;   PROSTATECTOMY  2003   ULNAR NERVE TRANSPOSITION Left January 2015   Patient Active Problem List   Diagnosis Date Noted   History of colonic polyps 10/19/2023   Primary hypertension 10/17/2021   PAD (peripheral artery disease) (HCC) 10/09/2021   Hyperlipidemia LDL goal <70 10/08/2021   Other microscopic hematuria 10/08/2021   B12 deficiency 06/07/2021   Arthritis of right hand 04/11/2021   Achilles tendonitis, bilateral 03/16/2020   Bilateral Achilles tendonosis 05/16/2019   Claudication of calf muscles (HCC) 08/11/2017   Chronic bilateral low back pain with right-sided sciatica 08/11/2017   Carotid stenosis 05/05/2017   Presbycusis of both ears 03/20/2017   Pulsatile tinnitus of both ears 03/20/2017   H/O coronary artery bypass surgery 11/20/2015   Atherosclerotic heart disease of native coronary artery without angina pectoris 11/15/2015   HNP (herniated nucleus pulposus), lumbar 11/15/2014   Lumbar radiculitis 11/15/2014   Ulnar neuropathy of left upper extremity 10/13/2014   AC (acromioclavicular) arthritis 04/04/2013   CMC arthritis, thumb, degenerative 06/14/2012    PCP: Azalia Leo, MD  REFERRING PROVIDER: Skeeter Dukes, MD  REFERRING  DIAG: M47.816 (ICD-10-CM) - Spondylosis without myelopathy or radiculopathy, lumbar region  Rationale for Evaluation and Treatment: Rehabilitation  THERAPY DIAG:  Other low back pain  Abnormal posture  Cramp and spasm  Muscle weakness (generalized)  Difficulty in walking, not elsewhere classified  Pain in left hip  ONSET DATE: 03/14/2024  SUBJECTIVE:                                                                                                                                                                                            SUBJECTIVE STATEMENT: Dry needling really helped last session.  I have been dealing with this for over a year and dry needling is the thing that has helped aside from trigger point injection.    PERTINENT HISTORY:  na  PAIN:  04/26/24 Are you having pain? Yes: NPRS scale: with walking 0-6/10 Pain location: left low back and glute Pain description: aching Aggravating factors: standing, bending, walking 1/4 mile  Relieving factors: rest, meds  PRECAUTIONS: None  RED FLAGS: None   WEIGHT BEARING RESTRICTIONS: No  FALLS:  Has patient fallen in last 6 months? No  LIVING ENVIRONMENT: Lives with: lives with their spouse Lives in: House/apartment  OCCUPATION: retired Art gallery manager  PLOF: Independent, Independent with basic ADLs, Independent with household mobility without device, Independent with community mobility without device, Independent with homemaking with ambulation, Independent with gait, and Independent with transfers  PATIENT GOALS: He hopes to regain ability to be active and workout.  NEXT MD VISIT: prn  OBJECTIVE:  Note: Objective measures were completed at Evaluation unless otherwise noted.  DIAGNOSTIC FINDINGS:  12/14/2020 IMPRESSION: 1. L5 is numbered as a transitional vertebra to remain consistent with the previous report from December 2015. We would have to count down from the cervical region for true anatomic numbering. 2. T12-L1: No change. Osteophytes and shallow disc protrusion indent the thecal sac but do not appear to cause neural compression. 3. L1-2: No change.  Mild non-compressive spondylosis. 4. L2-3: Endplate osteophytes and bulging of the disc. Mild bilateral lateral recess stenosis but without definite neural compression. Slight worsening since 2018. Chronic discogenic endplate marrow changes, but with some persistent edema, which could be associated with regional back pain 5. L3-4: Newly seen  discogenic endplate edema which could relate to back pain. Endplate osteophytes and bulging of the disc. Left foraminal narrowing that could affect the exiting L3 nerve. This foraminal stenosis is similar to the study of 2018. 6. L4-5: Chronic central disc protrusion with slight caudal down turning. This indents the thecal sac and contacts the S1 root sleeves, more on the left. This is unchanged from previous  studies  PATIENT SURVEYS:  Modified Oswestry complete next visit   COGNITION: Overall cognitive status: Within functional limits for tasks assessed     SENSATION: WFL  MUSCLE LENGTH: Hamstrings: Right 45 deg; Left 50 deg Thomas test: Right pos; Left pos  POSTURE: rounded shoulders, forward head, decreased lumbar lordosis, posterior pelvic tilt, and flexed trunk   PALPATION: Mild scoliosis and trunk shift noted on palpation of lumbar spine and lower back  LUMBAR ROM:   AROM eval  Flexion WFL  Extension WFL  Right lateral flexion WFL  Left lateral flexion WFL  Right rotation WNL  Left rotation WNL   (Blank rows = not tested)  LOWER EXTREMITY ROM:     WFL  LOWER EXTREMITY MMT:    All 4+ to 5/5 bilateral LE's  LUMBAR SPECIAL TESTS:  Straight leg raise test: Negative  FUNCTIONAL TESTS:  5 times sit to stand: 6.49 sec Timed up and go (TUG): 7.48 sec  GAIT: Distance walked: 30 feet Assistive device utilized: None Level of assistance: Complete Independence Comments: short step length but good heel strike, slight trendelenburg on right  TREATMENT DATE:  04/26/24: NuStep: Level 5 x 8 minutes- PT present to discuss progress Low trunk rotation 2x20 seconds bil  Supine piriformis stretch 3x20 seconds  Sit to stand with 5# kettlebell x10 neutral stance, x10 staggered, switched to Lt leg forward and had pain in Rt knee so stopped  Side lying clam with red loop x 20 Side lying reverse clam with red loop 2 x 10  Trigger Point Dry Needling Subsequent Treatment:  Instructions provided previously at initial dry needling treatment.  Patient Verbal Consent Given: Yes Education Handout Provided: Previously Provided Muscles Treated: lumbar multifidi left, left glut max, min and med, piriformis (all on left) Electrical Stimulation Performed: No Treatment Response/Outcome: Utilized skilled palpation to identify bony landmarks and trigger points.  Able to illicit twitch response and muscle elongation.  Soft tissue mobilization to muscles needled following DN to further promote tissue elongation and decreased pain.      04/21/24: Recumbent bike level 6 x 5 min Iron cross stretch x 5 each side 10 sec hold (PT assisting with holding foot for more isolated stretch of IT band and glut) Combo IR/ER hip stretch in hook lying x 5 each side with 10 sec hold Supine clamshell with red loop x 20 Side lying clam with red loop x 20 Side lying reverse clam with red loop 2 x 10 Hamstring curl using red physio ball 2 x 10 (offered easier version with heels digging in and just pulling to you or in bridge and do the curl - he opted for the harder option but struggled) Quadruped bird dog x 20 alternating Trigger Point Dry Needling Subsequent Treatment: Instructions provided previously at initial dry needling treatment.  Patient Verbal Consent Given: Yes Education Handout Provided: Previously Provided Muscles Treated: lumbar multifidi left, left glut max, min and med, piriformis (all on left) Electrical Stimulation Performed: No Treatment Response/Outcome: Skilled palpation used to identify taut bands and trigger points.  Once identified, dry needling techniques used to treat these areas.  Obvious twitch response ellicited at glut max along with palpable elongation of muscle and deep ache with all other muscle groups .  Following treatment, patient reported soreness.     04/19/24: Recumbent bike level 6 x 5 min Hamstring stretch 3 x 30 sec each LE Quad/hip flexor stretch 3 x 30  sec (3 airex pads in chair) Plank 2 x 30  sec Plank with hip dips attempted (able to do 2 x 10 but some pain with this) Educated on modifications to current workout regimen and his aggressive approach to exercises foregoing form at times Physio ball march x 20 Physio ball alternating arms and legs x 20 Squats on BOSU with bilateral UE support 2 x 10 Offered ice: patient declined.    PATIENT EDUCATION:  Education details: See above, also initiated HEP, also educated on dry needling  Person educated: Patient Education method: Explanation, Demonstration, Verbal cues, and Handouts Education comprehension: verbalized understanding, returned demonstration, and verbal cues required  HOME EXERCISE PROGRAM: Access Code: [email or web address] URL: [email or web address] Date: 04/06/2024 Prepared by: Minor Amble Beuhring  Exercises - Standing Hamstring Stretch on Chair  - 1 x daily - 7 x weekly - 1 sets - 3 reps - 30 sec hold - Quadricep Stretch with Chair and Counter Support  - 1 x daily - 7 x weekly - 1 sets - 3 reps - 30 sec hold - Seated Figure 4 Piriformis Stretch  - 1 x daily - 7 x weekly - 1 sets - 3 reps - 30 sed hold - Foam Roll 90/90 March  - 1 x daily - 7 x weekly - 1 sets - 10 reps - Squat with Chair Touch and Resistance Loop  - 1 x daily - 7 x weekly - 2 sets - 10 reps - Hip Abduction with Resistance Loop  - 1 x daily - 7 x weekly - 2 sets - 10 reps - Hip Extension with Resistance Loop  - 1 x daily - 7 x weekly - 2 sets - 10 reps - Standing Lumbar Extension  - 1 x daily - 7 x weekly - 1 sets - 10 reps - Standing Lumbar Extension with Counter  - 1 x daily - 7 x weekly - 1 sets - 10 reps  ASSESSMENT:  CLINICAL IMPRESSION: Pt reports that he feels 75% better after dry needling last session.   He did have heavy twitch and deep ache responses with DN again today.  We will proceed based on patients subjective report.  He would benefit from continuing skilled PT to progress toward goals  stated below.   OBJECTIVE IMPAIRMENTS: Abnormal gait, decreased mobility, difficulty walking, decreased strength, increased fascial restrictions, increased muscle spasms, impaired flexibility, postural dysfunction, and pain.   ACTIVITY LIMITATIONS: lifting, bending, standing, squatting, sleeping, stairs, transfers, bed mobility, bathing, and dressing  PARTICIPATION LIMITATIONS: meal prep, cleaning, laundry, driving, shopping, community activity, and yard work  PERSONAL FACTORS: Age, Past/current experiences, Time since onset of injury/illness/exacerbation, and 1-2 comorbidities: OA and Htn are also affecting patient's functional outcome.   REHAB POTENTIAL: Good  CLINICAL DECISION MAKING: Stable/uncomplicated  EVALUATION COMPLEXITY: Low   GOALS: Goals reviewed with patient? Yes  SHORT TERM GOALS: Target date: 04/12/2024  Pain report to be no greater than 4/10  Baseline: Goal status: IN PROGRESS 04/11/24  2.  Patient will be independent with initial HEP  Baseline:  Goal status: MET  3.  Patient to report 50% reduction in overall symptoms  Baseline: 75% (04/26/24) Goal status: MET   4.  Patient to be able to walk at least 1/2 mile without increase in pain Baseline:  Goal status: IN PROGRESS 04/11/24 1/4 MILE   LONG TERM GOALS: Target date: 05/10/2024  Patient to report pain no greater than 2/10  Baseline:  Goal status: In progress  2.  Patient to be independent with advanced HEP  Baseline:  Goal status: In progress  3.  Patient to report 85% improvement in overall symptoms  Baseline:  Goal status: In progress  4.  Patient to be able to stand or walk for at least 15 min without left low back and buttock pain Baseline:  Goal status: INITIAL  5.  Patient to demonstrate improved posture including awareness of lumbar lordosis  Baseline: working on this (04/26/24) Goal status: in progress   6.  Patient to be able to resume his prior level of activity/exercise Baseline:   Goal status: INITIAL  PLAN:  PT FREQUENCY: 1-2x/week  PT DURATION: 8 weeks  PLANNED INTERVENTIONS: 97110-Therapeutic exercises, 97530- Therapeutic activity, 97112- Neuromuscular re-education, 97535- Self Care, 16109- Manual therapy, [phone]- Gait training, [phone]- Aquatic Therapy, [phone]- Electrical stimulation (unattended), [phone]- Electrical stimulation (manual), [id number]- Ultrasound, [id number]- Traction (mechanical), [id number]- Ionotophoresis 4mg /ml Dexamethasone , Patient/Family education, Balance training, Stair training, Taping, Dry Needling, Joint mobilization, Spinal mobilization, Cryotherapy, and Moist heat.  PLAN FOR NEXT SESSION: assess response to dry needling. Nustep, progress core and  hip stability training and core strengthening.     Luella Sager, PT 04/26/24 11:04 AM  [facility] [address] [place], [place] [address] Phone # [phone] Fax [phone]

## 2024-04-28 ENCOUNTER — Ambulatory Visit

## 2024-04-28 DIAGNOSIS — M25552 Pain in left hip: Secondary | ICD-10-CM

## 2024-04-28 DIAGNOSIS — R252 Cramp and spasm: Secondary | ICD-10-CM

## 2024-04-28 DIAGNOSIS — M5459 Other low back pain: Secondary | ICD-10-CM

## 2024-04-28 DIAGNOSIS — M6281 Muscle weakness (generalized): Secondary | ICD-10-CM

## 2024-04-28 DIAGNOSIS — R262 Difficulty in walking, not elsewhere classified: Secondary | ICD-10-CM

## 2024-04-28 DIAGNOSIS — R293 Abnormal posture: Secondary | ICD-10-CM

## 2024-04-28 NOTE — Therapy (Addendum)
 OUTPATIENT PHYSICAL THERAPY THORACOLUMBAR TREATMENT Progress Note Reporting Period 03/15/24 to 04/28/24  See note below for Objective Data and Assessment of Progress/Goals.       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   Arthritis    Colon polyp    Coronary artery disease    Diverticula of colon    Dysrhythmia    PACs, PVCs   Heart disease    High blood pressure    High cholesterol    Hyperlipemia    Peripheral vascular disease (HCC)    Prostate cancer (HCC)    Past Surgical History:  Procedure Laterality Date   ABDOMINAL AORTOGRAM W/LOWER EXTREMITY N/A 10/16/2017   Procedure: ABDOMINAL AORTOGRAM W/LOWER EXTREMITY;  Surgeon: Harvey Carlin BRAVO, MD;  Location: [facility];  Service: Cardiovascular;  Laterality: N/A;   APPENDECTOMY     CARDIAC CATHETERIZATION N/A 11/13/2015   Procedure: Left Heart Cath and Coronary Angiography;  Surgeon: Vinie DELENA Jude, MD;  Location: [facility];  Service: Cardiovascular;  Laterality: N/A;   CATARACT EXTRACTION     COLON SURGERY  2008   right colon resection   COLONOSCOPY WITH PROPOFOL  N/A 01/06/2018   Procedure: COLONOSCOPY WITH PROPOFOL ;  Surgeon: Toledo, Ladell POUR, MD;  Location: [facility] ENDOSCOPY;  Service: Gastroenterology;  Laterality: N/A;   ENDARTERECTOMY POPLITEAL Right 11/11/2017   FEMORAL-POPLITEAL BYPASS GRAFT Right 11/11/2017   Procedure: RIGHT POPLITEAL ENDARTERECTOMY;  Surgeon:  Harvey Carlin BRAVO, MD;  Location: [facility];  Service: Vascular;  Laterality: Right;   LIPOMA EXCISION Right    thigh   METACARPOPHALANGEAL JOINT ARTHROPLASTY     PATCH ANGIOPLASTY Right 11/11/2017   Procedure: PATCH ANGIOPLASTY of Superficial Fenoral Artery;  Surgeon: Harvey Carlin BRAVO, MD;  Location: [facility] OR;  Service: Vascular;  Laterality: Right;   PROSTATECTOMY  2003   ULNAR NERVE TRANSPOSITION Left January 2015   Patient Active Problem List   Diagnosis Date Noted   History of colonic polyps 10/19/2023   Primary hypertension 10/17/2021   PAD (peripheral artery disease) (HCC) 10/09/2021   Hyperlipidemia LDL goal <70 10/08/2021   Other microscopic hematuria 10/08/2021   B12 deficiency 06/07/2021   Arthritis of right hand 04/11/2021   Achilles tendonitis, bilateral 03/16/2020   Bilateral Achilles tendonosis 05/16/2019   Claudication of calf muscles (HCC) 08/11/2017   Chronic bilateral low back pain with right-sided sciatica 08/11/2017   Carotid stenosis 05/05/2017   Presbycusis of both ears 03/20/2017   Pulsatile tinnitus of both ears 03/20/2017   H/O coronary artery bypass surgery 11/20/2015   Atherosclerotic heart disease of native coronary artery without angina pectoris 11/15/2015   HNP (herniated nucleus pulposus), lumbar 11/15/2014   Lumbar radiculitis 11/15/2014   Ulnar neuropathy of left upper extremity 10/13/2014   AC (acromioclavicular) arthritis 04/04/2013  CMC arthritis, thumb, degenerative 06/14/2012    PCP: Stephane Leita DEL, MD  REFERRING PROVIDER: Dreama Ozell Kent, MD  REFERRING DIAG: [phone] (ICD-10-CM) - Spondylosis without myelopathy or radiculopathy, lumbar region  Rationale for Evaluation and Treatment: Rehabilitation  THERAPY DIAG:  Other low back pain  Abnormal posture  Cramp and spasm  Muscle weakness (generalized)  Difficulty in walking, not elsewhere classified  Pain in left hip  ONSET DATE: 03/14/2024  SUBJECTIVE:                                                                                                                                                                                            SUBJECTIVE STATEMENT: Patient reports he is still doing well.  Still feeling great!  I have one more little spot that I want to address but the worst of it is much better.     PERTINENT HISTORY:  na  PAIN:  04/28/24 Are you having pain? Yes: NPRS scale: with walking 3/10 Pain location: left low back and glute Pain description: aching Aggravating factors: standing, bending, walking 1/4 mile  Relieving factors: rest, meds  PRECAUTIONS: None  RED FLAGS: None   WEIGHT BEARING RESTRICTIONS: No  FALLS:  Has patient fallen in last 6 months? No  LIVING ENVIRONMENT: Lives with: lives with their spouse Lives in: House/apartment  OCCUPATION: retired Art gallery manager  PLOF: Independent, Independent with basic ADLs, Independent with household mobility without device, Independent with community mobility without device, Independent with homemaking with ambulation, Independent with gait, and Independent with transfers  PATIENT GOALS: He hopes to regain ability to be active and workout.  NEXT MD VISIT: prn  OBJECTIVE:  Note: Objective measures were completed at Evaluation unless otherwise noted.  DIAGNOSTIC FINDINGS:  12/14/2020 IMPRESSION: 1. L5 is numbered as a transitional vertebra to remain consistent with the previous report from December 2015. We would have to count down from the cervical region for true anatomic numbering. 2. T12-L1: No change. Osteophytes and shallow disc protrusion indent the thecal sac but do not appear to cause neural compression. 3. L1-2: No change.  Mild non-compressive spondylosis. 4. L2-3: Endplate osteophytes and bulging of the disc. Mild bilateral lateral recess stenosis but without definite neural compression. Slight worsening since 2018. Chronic discogenic endplate marrow changes, but with  some persistent edema, which could be associated with regional back pain 5. L3-4: Newly seen discogenic endplate edema which could relate to back pain. Endplate osteophytes and bulging of the disc. Left foraminal narrowing that could affect the exiting L3 nerve. This foraminal stenosis is similar to the study of 2018. 6. L4-5: Chronic central disc protrusion with slight  caudal down turning. This indents the thecal sac and contacts the S1 root sleeves, more on the left. This is unchanged from previous studies  PATIENT SURVEYS:  Modified Oswestry complete next visit   COGNITION: Overall cognitive status: Within functional limits for tasks assessed     SENSATION: WFL  MUSCLE LENGTH: Hamstrings: Right 45 deg; Left 50 deg Thomas test: Right pos; Left pos  POSTURE: rounded shoulders, forward head, decreased lumbar lordosis, posterior pelvic tilt, and flexed trunk   PALPATION: Mild scoliosis and trunk shift noted on palpation of lumbar spine and lower back  LUMBAR ROM:   AROM eval  Flexion WFL  Extension WFL  Right lateral flexion WFL  Left lateral flexion WFL  Right rotation WNL  Left rotation WNL   (Blank rows = not tested)  LOWER EXTREMITY ROM:     WFL  LOWER EXTREMITY MMT:    All 4+ to 5/5 bilateral LE's  LUMBAR SPECIAL TESTS:  Straight leg raise test: Negative  FUNCTIONAL TESTS:  5 times sit to stand: 6.49 sec Timed up and go (TUG): 7.48 sec  GAIT: Distance walked: 30 feet Assistive device utilized: None Level of assistance: Complete Independence Comments: short step length but good heel strike, slight trendelenburg on right  TREATMENT DATE:  04/28/24: NuStep: Level 5 x 5 minutes- PT present to discuss progress Sit to stand with 20 lb kb 2 x 10 (heavy emphasis on hip hinging) Lateral band walks with red loop 5 laps of 10 feet  Cone touches in SLS 3 x 10 each LE 3D ball toss at rebounder in SLS x 20 each direction on each LE Hip matrix: hip abduction and  hip extension 2 x 10 with 40 lbs Low trunk rotation 2x20 seconds bil  Bridging x 20 Supine piriformis stretch 3x20 seconds  Side lying clam with red loop x 20 Side lying reverse clam with red loop 2 x 10  04/26/24: NuStep: Level 5 x 8 minutes- PT present to discuss progress Low trunk rotation 2x20 seconds bil  Supine piriformis stretch 3x20 seconds  Sit to stand with 5# kettlebell x10 neutral stance, x10 staggered, switched to Lt leg forward and had pain in Rt knee so stopped  Side lying clam with red loop x 20 Side lying reverse clam with red loop 2 x 10  Trigger Point Dry Needling Subsequent Treatment: Instructions provided previously at initial dry needling treatment.  Patient Verbal Consent Given: Yes Education Handout Provided: Previously Provided Muscles Treated: lumbar multifidi left, left glut max, min and med, piriformis (all on left) Electrical Stimulation Performed: No Treatment Response/Outcome: Utilized skilled palpation to identify bony landmarks and trigger points.  Able to illicit twitch response and muscle elongation.  Soft tissue mobilization to muscles needled following DN to further promote tissue elongation and decreased pain.      04/21/24: Recumbent bike level 6 x 5 min Iron cross stretch x 5 each side 10 sec hold (PT assisting with holding foot for more isolated stretch of IT band and glut) Combo IR/ER hip stretch in hook lying x 5 each side with 10 sec hold Supine clamshell with red loop x 20 Side lying clam with red loop x 20 Side lying reverse clam with red loop 2 x 10 Hamstring curl using red physio ball 2 x 10 (offered easier version with heels digging in and just pulling to you or in bridge and do the curl - he opted for the harder option but struggled) Quadruped bird dog x 20 alternating  Trigger Point Dry Needling Subsequent Treatment: Instructions provided previously at initial dry needling treatment.  Patient Verbal Consent Given: Yes Education  Handout Provided: Previously Provided Muscles Treated: lumbar multifidi left, left glut max, min and med, piriformis (all on left) Electrical Stimulation Performed: No Treatment Response/Outcome: Skilled palpation used to identify taut bands and trigger points.  Once identified, dry needling techniques used to treat these areas.  Obvious twitch response ellicited at glut max along with palpable elongation of muscle and deep ache with all other muscle groups .  Following treatment, patient reported soreness.     04/19/24: Recumbent bike level 6 x 5 min Hamstring stretch 3 x 30 sec each LE Quad/hip flexor stretch 3 x 30 sec (3 airex pads in chair) Plank 2 x 30 sec Plank with hip dips attempted (able to do 2 x 10 but some pain with this) Educated on modifications to current workout regimen and his aggressive approach to exercises foregoing form at times Physio ball march x 20 Physio ball alternating arms and legs x 20 Squats on BOSU with bilateral UE support 2 x 10 Offered ice: patient declined.    PATIENT EDUCATION:  Education details: See above, also initiated HEP, also educated on dry needling  Person educated: Patient Education method: Explanation, Demonstration, Verbal cues, and Handouts Education comprehension: verbalized understanding, returned demonstration, and verbal cues required  HOME EXERCISE PROGRAM: Access Code: [email or web address] URL: [email or web address] Date: 04/06/2024 Prepared by: Orvil Beuhring  Exercises - Standing Hamstring Stretch on Chair  - 1 x daily - 7 x weekly - 1 sets - 3 reps - 30 sec hold - Quadricep Stretch with Chair and Counter Support  - 1 x daily - 7 x weekly - 1 sets - 3 reps - 30 sec hold - Seated Figure 4 Piriformis Stretch  - 1 x daily - 7 x weekly - 1 sets - 3 reps - 30 sed hold - Foam Roll 90/90 March  - 1 x daily - 7 x weekly - 1 sets - 10 reps - Squat with Chair Touch and Resistance Loop  - 1 x daily - 7 x weekly - 2 sets - 10 reps -  Hip Abduction with Resistance Loop  - 1 x daily - 7 x weekly - 2 sets - 10 reps - Hip Extension with Resistance Loop  - 1 x daily - 7 x weekly - 2 sets - 10 reps - Standing Lumbar Extension  - 1 x daily - 7 x weekly - 1 sets - 10 reps - Standing Lumbar Extension with Counter  - 1 x daily - 7 x weekly - 1 sets - 10 reps  ASSESSMENT:  CLINICAL IMPRESSION: Pt is having excellent response to DN.  Shared that the DN will become an out of pocket expense as of Monday June 2nd.  We focused on proximal hip strength today as well as dynamic SLS activities that require glut med activation.  He struggles with hip hinging.  We worked on this diligently with visual cues using mirror and PT demonstrating.   He would benefit from continuing skilled PT to progress toward goals stated below.   OBJECTIVE IMPAIRMENTS: Abnormal gait, decreased mobility, difficulty walking, decreased strength, increased fascial restrictions, increased muscle spasms, impaired flexibility, postural dysfunction, and pain.   ACTIVITY LIMITATIONS: lifting, bending, standing, squatting, sleeping, stairs, transfers, bed mobility, bathing, and dressing  PARTICIPATION LIMITATIONS: meal prep, cleaning, laundry, driving, shopping, community activity, and yard work  PERSONAL FACTORS: Age, Past/current experiences, Time since onset  of injury/illness/exacerbation, and 1-2 comorbidities: OA and Htn are also affecting patient's functional outcome.   REHAB POTENTIAL: Good  CLINICAL DECISION MAKING: Stable/uncomplicated  EVALUATION COMPLEXITY: Low   GOALS: Goals reviewed with patient? Yes  SHORT TERM GOALS: Target date: 04/12/2024  Pain report to be no greater than 4/10  Baseline: Goal status: IN PROGRESS 04/11/24  2.  Patient will be independent with initial HEP  Baseline:  Goal status: MET  3.  Patient to report 50% reduction in overall symptoms  Baseline: 75% (04/26/24) Goal status: MET   4.  Patient to be able to walk at least  1/2 mile without increase in pain Baseline:  Goal status: IN PROGRESS 04/11/24 1/4 MILE   LONG TERM GOALS: Target date: 05/10/2024  Patient to report pain no greater than 2/10  Baseline:  Goal status: In progress  2.  Patient to be independent with advanced HEP  Baseline:  Goal status: In progress  3.  Patient to report 85% improvement in overall symptoms  Baseline:  Goal status: In progress  4.  Patient to be able to stand or walk for at least 15 min without left low back and buttock pain Baseline:  Goal status: INITIAL  5.  Patient to demonstrate improved posture including awareness of lumbar lordosis  Baseline: working on this (04/26/24) Goal status: in progress   6.  Patient to be able to resume his prior level of activity/exercise Baseline:  Goal status: INITIAL  PLAN:  PT FREQUENCY: 1-2x/week  PT DURATION: 8 weeks  PLANNED INTERVENTIONS: 97110-Therapeutic exercises, 97530- Therapeutic activity, 97112- Neuromuscular re-education, 97535- Self Care, [id number]- Manual therapy, [phone]- Gait training, [phone]- Aquatic Therapy, [phone]- Electrical stimulation (unattended), [phone]- Electrical stimulation (manual), [id number]- Ultrasound, [id number]- Traction (mechanical), [id number]- Ionotophoresis 4mg /ml Dexamethasone , Patient/Family education, Balance training, Stair training, Taping, Dry Needling, Joint mobilization, Spinal mobilization, Cryotherapy, and Moist heat.  PLAN FOR NEXT SESSION: DN if patient is willing to do OOP expense.   Nustep, progress core and  hip stability training and core strengthening.     Delon B. Seline Enzor, PT 04/28/24 11:02 AM [facility] [address], [address] [place], [place] [address] Phone # [phone] Fax [phone]

## 2024-05-02 ENCOUNTER — Encounter: Admitting: Physical Therapy

## 2024-05-03 ENCOUNTER — Ambulatory Visit (HOSPITAL_BASED_OUTPATIENT_CLINIC_OR_DEPARTMENT_OTHER): Attending: Internal Medicine | Admitting: Physical Therapy

## 2024-05-03 ENCOUNTER — Encounter (HOSPITAL_BASED_OUTPATIENT_CLINIC_OR_DEPARTMENT_OTHER): Payer: Self-pay | Admitting: Physical Therapy

## 2024-05-03 DIAGNOSIS — R293 Abnormal posture: Secondary | ICD-10-CM | POA: Diagnosis present

## 2024-05-03 DIAGNOSIS — R252 Cramp and spasm: Secondary | ICD-10-CM | POA: Diagnosis present

## 2024-05-03 DIAGNOSIS — M5459 Other low back pain: Secondary | ICD-10-CM | POA: Diagnosis present

## 2024-05-03 NOTE — Therapy (Signed)
 OUTPATIENT PHYSICAL THERAPY THORACOLUMBAR TREATMENT   Patient Name: Willie Burton MRN: [id number] DOB:August 08, 1946, 78 y.o., male Today's Date: 05/03/2024  END OF SESSION:  PT End of Session - 05/03/24 1100     Visit Number 10    Date for PT Re-Evaluation 05/10/24    Authorization Type Medicare    Progress Note Due on Visit 10    PT Start Time 1101    PT Stop Time 1140    PT Time Calculation (min) 39 min    Activity Tolerance Patient tolerated treatment well    Behavior During Therapy WFL for tasks assessed/performed                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   Arthritis    Colon polyp    Coronary artery disease    Diverticula of colon    Dysrhythmia    PACs, PVCs   Heart disease    High blood pressure    High cholesterol    Hyperlipemia    Peripheral vascular disease (HCC)    Prostate cancer (HCC)    Past Surgical History:  Procedure Laterality Date   ABDOMINAL AORTOGRAM W/LOWER EXTREMITY N/A 10/16/2017   Procedure: ABDOMINAL AORTOGRAM W/LOWER EXTREMITY;  Surgeon: Richrd Char, MD;  Location: [facility];  Service: Cardiovascular;  Laterality: N/A;   APPENDECTOMY     CARDIAC CATHETERIZATION N/A 11/13/2015   Procedure: Left Heart Cath and Coronary Angiography;  Surgeon: Ronney Cola, MD;  Location: [facility];  Service: Cardiovascular;  Laterality: N/A;   CATARACT EXTRACTION     COLON SURGERY  2008   right colon resection   COLONOSCOPY WITH PROPOFOL  N/A 01/06/2018   Procedure: COLONOSCOPY WITH PROPOFOL ;  Surgeon: Toledo, Alphonsus Jeans, MD;  Location: [facility] ENDOSCOPY;  Service: Gastroenterology;  Laterality: N/A;   ENDARTERECTOMY POPLITEAL Right 11/11/2017   FEMORAL-POPLITEAL BYPASS GRAFT Right 11/11/2017   Procedure: RIGHT POPLITEAL ENDARTERECTOMY;  Surgeon: Richrd Char, MD;  Location[facility] OR;  Service: Vascular;  Laterality: Right;   LIPOMA EXCISION Right    thigh    METACARPOPHALANGEAL JOINT ARTHROPLASTY     PATCH ANGIOPLASTY Right 11/11/2017   Procedure: PATCH ANGIOPLASTY of Superficial Fenoral Artery;  Surgeon: Richrd Char, MD;  Location: [facility];  Service: Vascular;  Laterality: Right;   PROSTATECTOMY  2003   ULNAR NERVE TRANSPOSITION Left January 2015   Patient Active Problem List   Diagnosis Date Noted   History of colonic polyps 10/19/2023   Primary hypertension 10/17/2021   PAD (peripheral artery disease) (HCC) 10/09/2021   Hyperlipidemia LDL goal <70 10/08/2021   Other microscopic hematuria 10/08/2021   B12 deficiency 06/07/2021   Arthritis of right hand 04/11/2021   Achilles tendonitis, bilateral 03/16/2020   Bilateral Achilles tendonosis 05/16/2019   Claudication of calf muscles (HCC) 08/11/2017   Chronic bilateral low back pain with right-sided sciatica 08/11/2017   Carotid stenosis 05/05/2017   Presbycusis of both ears 03/20/2017   Pulsatile tinnitus of both ears 03/20/2017   H/O coronary artery bypass surgery 11/20/2015   Atherosclerotic heart disease of native coronary artery without angina pectoris 11/15/2015   HNP (herniated nucleus pulposus), lumbar 11/15/2014   Lumbar radiculitis 11/15/2014   Ulnar neuropathy of left upper extremity 10/13/2014   AC (acromioclavicular) arthritis 04/04/2013   CMC arthritis, thumb, degenerative 06/14/2012    PCP: Azalia Leo, MD  REFERRING PROVIDER: Skeeter Dukes, MD  REFERRING  DIAG: M47.816 (ICD-10-CM) - Spondylosis without myelopathy or radiculopathy, lumbar region  Rationale for Evaluation and Treatment: Rehabilitation  THERAPY DIAG:  Other low back pain  Abnormal posture  Cramp and spasm  ONSET DATE: 03/14/2024  SUBJECTIVE:                                                                                                                                                                                           SUBJECTIVE STATEMENT: Patient reports no pain,  feeling good.  PERTINENT HISTORY:  na  PAIN:  04/28/24 Are you having pain? Yes: NPRS scale: with walking 3/10 Pain location: left low back and glute Pain description: aching Aggravating factors: standing, bending, walking 1/4 mile  Relieving factors: rest, meds  PRECAUTIONS: None  RED FLAGS: None   WEIGHT BEARING RESTRICTIONS: No  FALLS:  Has patient fallen in last 6 months? No  LIVING ENVIRONMENT: Lives with: lives with their spouse Lives in: House/apartment  OCCUPATION: retired Art gallery manager  PLOF: Independent, Independent with basic ADLs, Independent with household mobility without device, Independent with community mobility without device, Independent with homemaking with ambulation, Independent with gait, and Independent with transfers  PATIENT GOALS: He hopes to regain ability to be active and workout.  NEXT MD VISIT: prn  OBJECTIVE:  Note: Objective measures were completed at Evaluation unless otherwise noted.  DIAGNOSTIC FINDINGS:  12/14/2020 IMPRESSION: 1. L5 is numbered as a transitional vertebra to remain consistent with the previous report from December 2015. We would have to count down from the cervical region for true anatomic numbering. 2. T12-L1: No change. Osteophytes and shallow disc protrusion indent the thecal sac but do not appear to cause neural compression. 3. L1-2: No change.  Mild non-compressive spondylosis. 4. L2-3: Endplate osteophytes and bulging of the disc. Mild bilateral lateral recess stenosis but without definite neural compression. Slight worsening since 2018. Chronic discogenic endplate marrow changes, but with some persistent edema, which could be associated with regional back pain 5. L3-4: Newly seen discogenic endplate edema which could relate to back pain. Endplate osteophytes and bulging of the disc. Left foraminal narrowing that could affect the exiting L3 nerve. This foraminal stenosis is similar to the study of 2018. 6.  L4-5: Chronic central disc protrusion with slight caudal down turning. This indents the thecal sac and contacts the S1 root sleeves, more on the left. This is unchanged from previous studies  PATIENT SURVEYS:  Modified Oswestry complete next visit   COGNITION: Overall cognitive status: Within functional limits for tasks assessed     SENSATION: WFL  MUSCLE LENGTH: Hamstrings: Right 45 deg; Left 50 deg Thomas test: Right pos; Left  pos  POSTURE: rounded shoulders, forward head, decreased lumbar lordosis, posterior pelvic tilt, and flexed trunk   PALPATION: Mild scoliosis and trunk shift noted on palpation of lumbar spine and lower back  LUMBAR ROM:   AROM eval  Flexion WFL  Extension WFL  Right lateral flexion WFL  Left lateral flexion WFL  Right rotation WNL  Left rotation WNL   (Blank rows = not tested)  LOWER EXTREMITY ROM:     WFL  LOWER EXTREMITY MMT:    All 4+ to 5/5 bilateral LE's  LUMBAR SPECIAL TESTS:  Straight leg raise test: Negative  FUNCTIONAL TESTS:  5 times sit to stand: 6.49 sec Timed up and go (TUG): 7.48 sec  GAIT: Distance walked: 30 feet Assistive device utilized: None Level of assistance: Complete Independence Comments: short step length but good heel strike, slight trendelenburg on right  TREATMENT DATE:  [facility] Adult PT Treatment:                                                DATE: 05/03/24 Pt seen for aquatic therapy today.  Treatment took place in water 3.5-4.75 ft in depth at the [facility] pool. Temp of water was 91.  Pt entered/exited the pool via stair using alternating pattern with hand rail.  *Intro to setting *walking forward, back and side stepping in 4.0 ft unsupported *Yellow HB pull down for TrA engagement wide stance then staggered x 10 *Arm swing with red hand bells 8 slow-8 fast x 3 sets in wide stance then staggered *Suspended supine with chin on chest TrA engagement 5 reps with 10s hold *Knees to chest 2 x 5  in above position. *Prone suspension using blue HB upright fly 2 x 5   Pt requires the buoyancy and hydrostatic pressure of water for support, and to offload joints by unweighting joint load by at least 50 % in navel deep water and by at least 75-80% in chest to neck deep water.  Viscosity of the water is needed for resistance of strengthening. Water current perturbations provides challenge to standing balance requiring increased core activation.     04/28/24: NuStep: Level 5 x 5 minutes- PT present to discuss progress Sit to stand with 20 lb kb 2 x 10 (heavy emphasis on hip hinging) Lateral band walks with red loop 5 laps of 10 feet  Cone touches in SLS 3 x 10 each LE 3D ball toss at rebounder in SLS x 20 each direction on each LE Hip matrix: hip abduction and hip extension 2 x 10 with 40 lbs Low trunk rotation 2x20 seconds bil  Bridging x 20 Supine piriformis stretch 3x20 seconds  Side lying clam with red loop x 20 Side lying reverse clam with red loop 2 x 10  04/26/24: NuStep: Level 5 x 8 minutes- PT present to discuss progress Low trunk rotation 2x20 seconds bil  Supine piriformis stretch 3x20 seconds  Sit to stand with 5# kettlebell x10 neutral stance, x10 staggered, switched to Lt leg forward and had pain in Rt knee so stopped  Side lying clam with red loop x 20 Side lying reverse clam with red loop 2 x 10  Trigger Point Dry Needling Subsequent Treatment: Instructions provided previously at initial dry needling treatment.  Patient Verbal Consent Given: Yes Education Handout Provided: Previously Provided Muscles Treated: lumbar multifidi left, left glut  max, min and med, piriformis (all on left) Electrical Stimulation Performed: No Treatment Response/Outcome: Utilized skilled palpation to identify bony landmarks and trigger points.  Able to illicit twitch response and muscle elongation.  Soft tissue mobilization to muscles needled following DN to further promote tissue  elongation and decreased pain.      04/21/24: Recumbent bike level 6 x 5 min Iron cross stretch x 5 each side 10 sec hold (PT assisting with holding foot for more isolated stretch of IT band and glut) Combo IR/ER hip stretch in hook lying x 5 each side with 10 sec hold Supine clamshell with red loop x 20 Side lying clam with red loop x 20 Side lying reverse clam with red loop 2 x 10 Hamstring curl using red physio ball 2 x 10 (offered easier version with heels digging in and just pulling to you or in bridge and do the curl - he opted for the harder option but struggled) Quadruped bird dog x 20 alternating Trigger Point Dry Needling Subsequent Treatment: Instructions provided previously at initial dry needling treatment.  Patient Verbal Consent Given: Yes Education Handout Provided: Previously Provided Muscles Treated: lumbar multifidi left, left glut max, min and med, piriformis (all on left) Electrical Stimulation Performed: No Treatment Response/Outcome: Skilled palpation used to identify taut bands and trigger points.  Once identified, dry needling techniques used to treat these areas.  Obvious twitch response ellicited at glut max along with palpable elongation of muscle and deep ache with all other muscle groups .  Following treatment, patient reported soreness.     04/19/24: Recumbent bike level 6 x 5 min Hamstring stretch 3 x 30 sec each LE Quad/hip flexor stretch 3 x 30 sec (3 airex pads in chair) Plank 2 x 30 sec Plank with hip dips attempted (able to do 2 x 10 but some pain with this) Educated on modifications to current workout regimen and his aggressive approach to exercises foregoing form at times Physio ball march x 20 Physio ball alternating arms and legs x 20 Squats on BOSU with bilateral UE support 2 x 10 Offered ice: patient declined.    PATIENT EDUCATION:  Education details: See above, also initiated HEP, also educated on dry needling  Person educated:  Patient Education method: Explanation, Demonstration, Verbal cues, and Handouts Education comprehension: verbalized understanding, returned demonstration, and verbal cues required  HOME EXERCISE PROGRAM: Access Code: [email or web address] URL: [email or web address] Date: 04/06/2024 Prepared by: Minor Amble Beuhring  Exercises - Standing Hamstring Stretch on Chair  - 1 x daily - 7 x weekly - 1 sets - 3 reps - 30 sec hold - Quadricep Stretch with Chair and Counter Support  - 1 x daily - 7 x weekly - 1 sets - 3 reps - 30 sec hold - Seated Figure 4 Piriformis Stretch  - 1 x daily - 7 x weekly - 1 sets - 3 reps - 30 sed hold - Foam Roll 90/90 March  - 1 x daily - 7 x weekly - 1 sets - 10 reps - Squat with Chair Touch and Resistance Loop  - 1 x daily - 7 x weekly - 2 sets - 10 reps - Hip Abduction with Resistance Loop  - 1 x daily - 7 x weekly - 2 sets - 10 reps - Hip Extension with Resistance Loop  - 1 x daily - 7 x weekly - 2 sets - 10 reps - Standing Lumbar Extension  - 1 x daily - 7 x weekly - 1 sets -  10 reps - Standing Lumbar Extension with Counter  - 1 x daily - 7 x weekly - 1 sets - 10 reps  ASSESSMENT:  CLINICAL IMPRESSION: Pt completed all upper level aquatic exercises with ease.  He reports he feels as though he didn't do much of a work out.  Says his own exercise regimen at home was harder. Pt demonstrates good core strength and no pain.  Does not appear to need properties of water to progress towards land based goals. Anticipate next and last aquatic appointment to be cancelled.  Will allow land based PT to make judgement call after assessing pt's toleration to the aquatic session.   OBJECTIVE IMPAIRMENTS: Abnormal gait, decreased mobility, difficulty walking, decreased strength, increased fascial restrictions, increased muscle spasms, impaired flexibility, postural dysfunction, and pain.   ACTIVITY LIMITATIONS: lifting, bending, standing, squatting, sleeping, stairs, transfers, bed  mobility, bathing, and dressing  PARTICIPATION LIMITATIONS: meal prep, cleaning, laundry, driving, shopping, community activity, and yard work  PERSONAL FACTORS: Age, Past/current experiences, Time since onset of injury/illness/exacerbation, and 1-2 comorbidities: OA and Htn are also affecting patient's functional outcome.   REHAB POTENTIAL: Good  CLINICAL DECISION MAKING: Stable/uncomplicated  EVALUATION COMPLEXITY: Low   GOALS: Goals reviewed with patient? Yes  SHORT TERM GOALS: Target date: 04/12/2024  Pain report to be no greater than 4/10  Baseline: Goal status: IN PROGRESS 04/11/24  2.  Patient will be independent with initial HEP  Baseline:  Goal status: MET  3.  Patient to report 50% reduction in overall symptoms  Baseline: 75% (04/26/24) Goal status: MET   4.  Patient to be able to walk at least 1/2 mile without increase in pain Baseline:  Goal status: IN PROGRESS 04/11/24 1/4 MILE   LONG TERM GOALS: Target date: 05/10/2024  Patient to report pain no greater than 2/10  Baseline:  Goal status: In progress  2.  Patient to be independent with advanced HEP  Baseline:  Goal status: In progress  3.  Patient to report 85% improvement in overall symptoms  Baseline:  Goal status: In progress  4.  Patient to be able to stand or walk for at least 15 min without left low back and buttock pain Baseline:  Goal status: INITIAL  5.  Patient to demonstrate improved posture including awareness of lumbar lordosis  Baseline: working on this (04/26/24) Goal status: in progress   6.  Patient to be able to resume his prior level of activity/exercise Baseline:  Goal status: INITIAL  PLAN:  PT FREQUENCY: 1-2x/week  PT DURATION: 8 weeks  PLANNED INTERVENTIONS: 97110-Therapeutic exercises, 97530- Therapeutic activity, 97112- Neuromuscular re-education, 97535- Self Care, 16109- Manual therapy, [phone]- Gait training, [phone]- Aquatic Therapy, [phone]- Electrical stimulation  (unattended), [phone]- Electrical stimulation (manual), [id number]- Ultrasound, [id number]- Traction (mechanical), [id number]- Ionotophoresis 4mg /ml Dexamethasone , Patient/Family education, Balance training, Stair training, Taping, Dry Needling, Joint mobilization, Spinal mobilization, Cryotherapy, and Moist heat.  PLAN FOR NEXT SESSION: DN if patient is willing to do OOP expense.   Nustep, progress core and  hip stability training and core strengthening.     Adriana Hopping [place]) Christoher Drudge MPT 05/03/24 11:04 AM [facility] [address] [place], [place], [address] Phone: [phone]   Fax:  [phone]

## 2024-05-04 ENCOUNTER — Encounter

## 2024-05-10 ENCOUNTER — Encounter

## 2024-05-10 ENCOUNTER — Encounter (HOSPITAL_BASED_OUTPATIENT_CLINIC_OR_DEPARTMENT_OTHER): Admitting: Physical Therapy

## 2024-05-11 ENCOUNTER — Other Ambulatory Visit (HOSPITAL_BASED_OUTPATIENT_CLINIC_OR_DEPARTMENT_OTHER): Payer: Self-pay | Admitting: Family

## 2024-05-11 DIAGNOSIS — E785 Hyperlipidemia, unspecified: Secondary | ICD-10-CM

## 2024-05-11 DIAGNOSIS — I25118 Atherosclerotic heart disease of native coronary artery with other forms of angina pectoris: Secondary | ICD-10-CM

## 2024-05-11 DIAGNOSIS — I739 Peripheral vascular disease, unspecified: Secondary | ICD-10-CM

## 2024-05-12 ENCOUNTER — Ambulatory Visit: Attending: Internal Medicine

## 2024-05-12 DIAGNOSIS — M6281 Muscle weakness (generalized): Secondary | ICD-10-CM | POA: Diagnosis present

## 2024-05-12 DIAGNOSIS — R293 Abnormal posture: Secondary | ICD-10-CM | POA: Diagnosis present

## 2024-05-12 DIAGNOSIS — M5459 Other low back pain: Secondary | ICD-10-CM | POA: Insufficient documentation

## 2024-05-12 DIAGNOSIS — M62838 Other muscle spasm: Secondary | ICD-10-CM | POA: Insufficient documentation

## 2024-05-12 DIAGNOSIS — R262 Difficulty in walking, not elsewhere classified: Secondary | ICD-10-CM | POA: Insufficient documentation

## 2024-05-12 DIAGNOSIS — R252 Cramp and spasm: Secondary | ICD-10-CM | POA: Diagnosis present

## 2024-05-12 DIAGNOSIS — M25552 Pain in left hip: Secondary | ICD-10-CM | POA: Diagnosis present

## 2024-05-12 NOTE — Therapy (Signed)
 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   Arthritis    Colon polyp    Coronary artery disease    Diverticula of colon    Dysrhythmia    PACs, PVCs   Heart disease    High blood pressure    High cholesterol    Hyperlipemia    Peripheral vascular disease (HCC)    Prostate cancer [facility])    Past Surgical History:  Procedure Laterality Date   ABDOMINAL AORTOGRAM W/LOWER EXTREMITY N/A 10/16/2017   Procedure: ABDOMINAL AORTOGRAM W/LOWER EXTREMITY;  Surgeon: Richrd Char, MD;  Location: [facility] INVASIVE CV LAB;  Service: Cardiovascular;  Laterality: N/A;   APPENDECTOMY     CARDIAC CATHETERIZATION N/A 11/13/2015   Procedure: Left Heart Cath and Coronary Angiography;  Surgeon: Ronney Cola, MD;  Location: [facility] INVASIVE CV LAB;  Service: Cardiovascular;  Laterality: N/A;   CATARACT EXTRACTION     COLON SURGERY  2008   right colon resection   COLONOSCOPY WITH PROPOFOL  N/A 01/06/2018   Procedure: COLONOSCOPY WITH PROPOFOL ;  Surgeon: Toledo, Alphonsus Jeans, MD;  Location: [facility] ENDOSCOPY;  Service: Gastroenterology;  Laterality: N/A;   ENDARTERECTOMY POPLITEAL Right 11/11/2017   FEMORAL-POPLITEAL BYPASS GRAFT Right 11/11/2017   Procedure: RIGHT POPLITEAL ENDARTERECTOMY;  Surgeon: Richrd Char, MD;  Location: [facility] OR;  Service: Vascular;  Laterality: Right;   LIPOMA EXCISION Right    thigh   METACARPOPHALANGEAL JOINT ARTHROPLASTY     PATCH ANGIOPLASTY Right 11/11/2017   Procedure: PATCH ANGIOPLASTY of Superficial Fenoral Artery;  Surgeon: Richrd Char, MD;  Location: [facility] OR;  Service: Vascular;  Laterality: Right;   PROSTATECTOMY  2003   ULNAR NERVE TRANSPOSITION Left January  2015   Patient Active Problem List   Diagnosis Date Noted   History of colonic polyps 10/19/2023   Primary hypertension 10/17/2021   PAD (peripheral artery disease) (HCC) 10/09/2021   Hyperlipidemia LDL goal <70 10/08/2021   Other microscopic hematuria 10/08/2021   B12 deficiency 06/07/2021   Arthritis of right hand 04/11/2021   Achilles tendonitis, bilateral 03/16/2020   Bilateral Achilles tendonosis 05/16/2019   Claudication of calf muscles (HCC) 08/11/2017   Chronic bilateral low back pain with right-sided sciatica 08/11/2017   Carotid stenosis 05/05/2017   Presbycusis of both ears 03/20/2017   Pulsatile tinnitus of both ears 03/20/2017   H/O coronary artery bypass surgery 11/20/2015   Atherosclerotic heart disease of native coronary artery without angina pectoris 11/15/2015   HNP (herniated nucleus pulposus), lumbar 11/15/2014   Lumbar radiculitis 11/15/2014   Ulnar neuropathy of left upper extremity 10/13/2014   AC (acromioclavicular) arthritis 04/04/2013   CMC arthritis, thumb, degenerative 06/14/2012    PCP: Azalia Leo, MD  REFERRING PROVIDER: Skeeter Dukes, MD  REFERRING DIAG: [phone] (ICD-10-CM) - Spondylosis without myelopathy or radiculopathy, lumbar region  Rationale for Evaluation and Treatment: Rehabilitation  THERAPY DIAG:  Other low back pain - Plan: PT plan of care cert/re-cert  Abnormal posture - Plan: PT plan of care cert/re-cert  Cramp and spasm - Plan: PT plan of care cert/re-cert  Muscle weakness (generalized) - Plan: PT plan of care cert/re-cert  Difficulty in walking, not elsewhere classified - Plan: PT plan of care cert/re-cert  Pain in left hip - Plan: PT plan of care cert/re-cert  Other muscle spasm - Plan: PT plan of care cert/re-cert  ONSET DATE: 03/14/2024  SUBJECTIVE:                                                                                                                                                                                            SUBJECTIVE STATEMENT: I have the same amount of pain when I walk.  I can walk 3 miles, it hurts though.  Aquatic PT was not helpful for me and the therapist agreed.  I feel like we are focusing on my back and it is my glute muscle that is the problem.    PERTINENT HISTORY:  Full thickness glute tear on the Lt-per pt report   PAIN:  05/12/24 Are you having pain? Yes: NPRS scale: with walking 0-7/10 Pain location: left low back and glute Pain description: aching Aggravating factors: standing, bending, walking 1/4 mile  Relieving factors: rest, meds  PRECAUTIONS: None  RED FLAGS: None   WEIGHT BEARING RESTRICTIONS: No  FALLS:  Has patient fallen in last 6 months? No  LIVING ENVIRONMENT: Lives with: lives with their spouse Lives in: House/apartment  OCCUPATION: retired Art gallery manager  PLOF: Independent, Independent with basic ADLs, Independent with household mobility without device, Independent with community mobility without device, Independent with homemaking with ambulation, Independent with gait, and Independent with transfers  PATIENT GOALS: He hopes to regain ability to be active and workout.  NEXT MD VISIT: prn  OBJECTIVE:  Note: Objective measures were completed at Evaluation unless otherwise noted.  DIAGNOSTIC FINDINGS:  12/14/2020 IMPRESSION: 1. L5 is numbered as a transitional vertebra to remain consistent with the previous report from December 2015. We would have to count down from the cervical region for true anatomic numbering. 2. T12-L1: No change. Osteophytes and shallow disc protrusion indent the thecal sac but do not appear to cause neural compression. 3. L1-2: No change.  Mild non-compressive spondylosis. 4. L2-3: Endplate osteophytes and bulging of the disc. Mild bilateral lateral recess stenosis but without definite neural compression. Slight worsening since 2018. Chronic discogenic endplate marrow changes, but with some  persistent edema, which could be associated with regional back pain 5. L3-4: Newly seen discogenic endplate edema which could relate to back pain. Endplate osteophytes and bulging of the disc. Left foraminal narrowing that could affect the exiting L3 nerve. This foraminal stenosis is similar to the study of 2018. 6. L4-5: Chronic central disc protrusion with slight caudal down turning. This indents the thecal  sac and contacts the S1 root sleeves, more on the left. This is unchanged from previous studies  COGNITION: Overall cognitive status: Within functional limits for tasks assessed     SENSATION: WFL  MUSCLE LENGTH: Hamstrings: Right 45 deg; Left 50 deg Thomas test: Right pos; Left pos  POSTURE: rounded shoulders, forward head, decreased lumbar lordosis, posterior pelvic tilt, and flexed trunk   PALPATION: Mild scoliosis and trunk shift noted on palpation of lumbar spine and lower back  LUMBAR ROM:   AROM eval  Flexion WFL  Extension WFL  Right lateral flexion WFL  Left lateral flexion WFL  Right rotation WNL  Left rotation WNL   (Blank rows = not tested)  LOWER EXTREMITY ROM:     WFL  LOWER EXTREMITY MMT:    All 4+ to 5/5 bilateral LE's  LUMBAR SPECIAL TESTS:  Straight leg raise test: Negative  FUNCTIONAL TESTS:  5 times sit to stand: 6.49 sec Timed up and go (TUG): 7.48 sec  GAIT: Distance walked: 30 feet Assistive device utilized: None Level of assistance: Complete Independence Comments: short step length but good heel strike, slight trendelenburg on right  TREATMENT DATE:   04/26/24: NuStep: Level 5 x 8 minutes- PT present to discuss progress ERO complete  Seated piriformis stretch 3x20 seconds   Trigger Point Dry Needling Subsequent Treatment: Instructions provided previously at initial dry needling treatment.  Patient Verbal Consent Given: Yes Education Handout Provided: Previously Provided Muscles Treated: lumbar multifidi left, left glut  max, min and med, piriformis (all on left) Electrical Stimulation Performed: No Treatment Response/Outcome: Utilized skilled palpation to identify bony landmarks and trigger points.  Able to illicit twitch response and muscle elongation.   Soft tissue mobilization to muscles needled following DN to further promote tissue elongation and decreased pain.      [facility] Adult PT Treatment:                                                DATE: 05/03/24 Pt seen for aquatic therapy today.  Treatment took place in water 3.5-4.75 ft in depth at the [facility] pool. Temp of water was 91.  Pt entered/exited the pool via stair using alternating pattern with hand rail.  *Intro to setting *walking forward, back and side stepping in 4.0 ft unsupported *Yellow HB pull down for TrA engagement wide stance then staggered x 10 *Arm swing with red hand bells 8 slow-8 fast x 3 sets in wide stance then staggered *Suspended supine with chin on chest TrA engagement 5 reps with 10s hold *Knees to chest 2 x 5 in above position. *Prone suspension using blue HB upright fly 2 x 5   Pt requires the buoyancy and hydrostatic pressure of water for support, and to offload joints by unweighting joint load by at least 50 % in navel deep water and by at least 75-80% in chest to neck deep water.  Viscosity of the water is needed for resistance of strengthening. Water current perturbations provides challenge to standing balance requiring increased core activation.     04/28/24: NuStep: Level 5 x 5 minutes- PT present to discuss progress Sit to stand with 20 lb kb 2 x 10 (heavy emphasis on hip hinging) Lateral band walks with red loop 5 laps of 10 feet  Cone touches in SLS 3 x 10 each LE 3D ball toss  at rebounder in SLS x 20 each direction on each LE Hip matrix: hip abduction and hip extension 2 x 10 with 40 lbs Low trunk rotation 2x20 seconds bil  Bridging x 20 Supine piriformis stretch 3x20 seconds  Side lying clam with  red loop x 20 Side lying reverse clam with red loop 2 x 10   PATIENT EDUCATION:  Education details: See above, also initiated HEP, also educated on dry needling  Person educated: Patient Education method: Explanation, Demonstration, Verbal cues, and Handouts Education comprehension: verbalized understanding, returned demonstration, and verbal cues required  HOME EXERCISE PROGRAM: Access Code: [email or web address] URL: [email or web address] Date: 04/06/2024 Prepared by: Minor Amble Beuhring  Exercises - Standing Hamstring Stretch on Chair  - 1 x daily - 7 x weekly - 1 sets - 3 reps - 30 sec hold - Quadricep Stretch with Chair and Counter Support  - 1 x daily - 7 x weekly - 1 sets - 3 reps - 30 sec hold - Seated Figure 4 Piriformis Stretch  - 1 x daily - 7 x weekly - 1 sets - 3 reps - 30 sed hold - Foam Roll 90/90 March  - 1 x daily - 7 x weekly - 1 sets - 10 reps - Squat with Chair Touch and Resistance Loop  - 1 x daily - 7 x weekly - 2 sets - 10 reps - Hip Abduction with Resistance Loop  - 1 x daily - 7 x weekly - 2 sets - 10 reps - Hip Extension with Resistance Loop  - 1 x daily - 7 x weekly - 2 sets - 10 reps - Standing Lumbar Extension  - 1 x daily - 7 x weekly - 1 sets - 10 reps - Standing Lumbar Extension with Counter  - 1 x daily - 7 x weekly - 1 sets - 10 reps  ASSESSMENT:  CLINICAL IMPRESSION: Pt reports that he didn't need aquatics and felt it was a waste of time. He requested to cancel the remaining aquatic appt. Pt reports that he experiences the same gluteal pain on the Lt as he did at the beginning of PT, up to 7/10.  He is now able to complete his 3 miles of walking, pain remains 7/10.  Pt reports that he was diagnosed with a full thickness gluteal tear years ago that he was told will never fully heal.  PT educated pt on the importance of strength and stability to improve endurance of Lt gluteals for distance walking.  Good response to dry needling with improved tissue  mobility and twitch response.  Patient will benefit from skilled PT to address the below impairments and improve overall function.    OBJECTIVE IMPAIRMENTS: Abnormal gait, decreased mobility, difficulty walking, decreased strength, increased fascial restrictions, increased muscle spasms, impaired flexibility, postural dysfunction, and pain.   ACTIVITY LIMITATIONS: lifting, bending, standing, squatting, sleeping, stairs, transfers, bed mobility, bathing, and dressing  PARTICIPATION LIMITATIONS: meal prep, cleaning, laundry, driving, shopping, community activity, and yard work  PERSONAL FACTORS: Age, Past/current experiences, Time since onset of injury/illness/exacerbation, and 1-2 comorbidities: OA and Htn are also affecting patient's functional outcome.   REHAB POTENTIAL: Good  CLINICAL DECISION MAKING: Stable/uncomplicated  EVALUATION COMPLEXITY: Low   GOALS: Goals reviewed with patient? Yes  SHORT TERM GOALS: Target date: 04/12/2024  Pain report to be no greater than 4/10  Baseline: Goal status: IN PROGRESS 04/11/24  2.  Patient will be independent with initial HEP  Baseline:  Goal status: MET  3.  Patient to  report 50% reduction in overall symptoms  Baseline: 75% (04/26/24) Goal status: MET   4.  Patient to be able to walk at least 1/2 mile without increase in pain Baseline:  Goal status: IN PROGRESS 04/11/24 1/4 MILE   LONG TERM GOALS: Target date: 06/23/2024    Patient to report pain no greater than 2/10  Baseline: 7/10 in Lt upper glute with walking >1/4 mile (05/12/24) Goal status: In progress  2.  Patient to be independent with advanced HEP  Baseline:  Goal status: In progress  3.  Patient to report 85% improvement in overall symptoms  Baseline: able to walk full 3 miles but pain at 1/4 mile Goal status: In progress  4.  Patient to be able to stand or walk for at least 15 min without left low back and buttock pain Baseline: 7/10 Goal status: in progress    5.  Patient to demonstrate improved posture including awareness of lumbar lordosis  Baseline: working on this (04/26/24) Goal status: in progress   6.  Patient to be able to resume his regular 3 mile walk.  Baseline: pain at 1/4 mile and is able to push through to 3 miles with 7/10 pain in Lt gluteal (05/12/24) Goal status:  in progress   PLAN:  PT FREQUENCY: 1-2x/week  PT DURATION: 6 weeks  PLANNED INTERVENTIONS: 97110-Therapeutic exercises, 97530- Therapeutic activity, 97112- Neuromuscular re-education, 97535- Self Care, [id number]- Manual therapy, [id number]- Gait training, [phone]- Aquatic Therapy, [phone]- Electrical stimulation (unattended), [phone]- Electrical stimulation (manual), [id number]- Ultrasound, [id number]- Traction (mechanical), [id number]- Ionotophoresis 4mg /ml Dexamethasone , Patient/Family education, Balance training, Stair training, Taping, Dry Needling, Joint mobilization, Spinal mobilization, Cryotherapy, and Moist heat.  PLAN FOR NEXT SESSION: Pt would like to continue for a few more sessions of dry needling as that is helping him the most.  Adjust HEP as needed- he is consistent and compliant.     Luella Sager, PT 05/12/24 12:05 PM

## 2024-05-16 ENCOUNTER — Encounter: Admitting: Physical Therapy

## 2024-05-16 ENCOUNTER — Encounter (HOSPITAL_BASED_OUTPATIENT_CLINIC_OR_DEPARTMENT_OTHER): Admitting: Physical Therapy

## 2024-05-18 ENCOUNTER — Ambulatory Visit

## 2024-05-18 DIAGNOSIS — R262 Difficulty in walking, not elsewhere classified: Secondary | ICD-10-CM

## 2024-05-18 DIAGNOSIS — M6281 Muscle weakness (generalized): Secondary | ICD-10-CM

## 2024-05-18 DIAGNOSIS — M5459 Other low back pain: Secondary | ICD-10-CM

## 2024-05-18 DIAGNOSIS — R252 Cramp and spasm: Secondary | ICD-10-CM

## 2024-05-18 DIAGNOSIS — M25552 Pain in left hip: Secondary | ICD-10-CM

## 2024-05-18 DIAGNOSIS — R293 Abnormal posture: Secondary | ICD-10-CM

## 2024-05-18 DIAGNOSIS — M62838 Other muscle spasm: Secondary | ICD-10-CM

## 2024-05-18 NOTE — Therapy (Signed)
 OUTPATIENT PHYSICAL THERAPY THORACOLUMBAR TREATMENT   Patient Name: Willie Burton MRN: [id number] DOB:Apr 13, 1946, 78 y.o., male Today's Date: 05/18/2024  END OF SESSION:  PT End of Session - 05/18/24 1020     Visit Number 12    Date for PT Re-Evaluation 06/23/24    Authorization Type Medicare    Progress Note Due on Visit 20    PT Start Time 1020    PT Stop Time 1046    PT Time Calculation (min) 26 min    Activity Tolerance Patient tolerated treatment well    Behavior During Therapy WFL for tasks assessed/performed              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   Arthritis    Colon polyp    Coronary artery disease    Diverticula of colon    Dysrhythmia    PACs, PVCs   Heart disease    High blood pressure    High cholesterol    Hyperlipemia    Peripheral vascular disease (HCC)    Prostate cancer (HCC)    Past Surgical History:  Procedure Laterality Date   ABDOMINAL AORTOGRAM W/LOWER EXTREMITY N/A 10/16/2017   Procedure: ABDOMINAL AORTOGRAM W/LOWER EXTREMITY;  Surgeon: Richrd Char, MD;  Location: [facility];  Service: Cardiovascular;  Laterality: N/A;   APPENDECTOMY     CARDIAC CATHETERIZATION N/A 11/13/2015   Procedure: Left Heart Cath and Coronary Angiography;  Surgeon: Ronney Cola, MD;  Location: [facility];  Service: Cardiovascular;  Laterality: N/A;   CATARACT EXTRACTION     COLON SURGERY  2008   right colon resection   COLONOSCOPY WITH PROPOFOL  N/A 01/06/2018   Procedure: COLONOSCOPY WITH PROPOFOL ;  Surgeon: Toledo, Alphonsus Jeans, MD;  Location: [facility] ENDOSCOPY;  Service: Gastroenterology;  Laterality: N/A;   ENDARTERECTOMY POPLITEAL Right 11/11/2017   FEMORAL-POPLITEAL BYPASS GRAFT Right 11/11/2017   Procedure: RIGHT POPLITEAL ENDARTERECTOMY;  Surgeon: Richrd Char, MD;  Location: [facility] OR;  Service: Vascular;  Laterality: Right;   LIPOMA EXCISION Right    thigh    METACARPOPHALANGEAL JOINT ARTHROPLASTY     PATCH ANGIOPLASTY Right 11/11/2017   Procedure: PATCH ANGIOPLASTY of Superficial Fenoral Artery;  Surgeon: Richrd Char, MD;  Location: [facility] OR;  Service: Vascular;  Laterality: Right;   PROSTATECTOMY  2003   ULNAR NERVE TRANSPOSITION Left January 2015   Patient Active Problem List   Diagnosis Date Noted   History of colonic polyps 10/19/2023   Primary hypertension 10/17/2021   PAD (peripheral artery disease) (HCC) 10/09/2021   Hyperlipidemia LDL goal <70 10/08/2021   Other microscopic hematuria 10/08/2021   B12 deficiency 06/07/2021   Arthritis of right hand 04/11/2021   Achilles tendonitis, bilateral 03/16/2020   Bilateral Achilles tendonosis 05/16/2019   Claudication of calf muscles (HCC) 08/11/2017   Chronic bilateral low back pain with right-sided sciatica 08/11/2017   Carotid stenosis 05/05/2017   Presbycusis of both ears 03/20/2017   Pulsatile tinnitus of both ears 03/20/2017   H/O coronary artery bypass surgery 11/20/2015   Atherosclerotic heart disease of native coronary artery without angina pectoris 11/15/2015   HNP (herniated nucleus pulposus), lumbar 11/15/2014   Lumbar radiculitis 11/15/2014   Ulnar neuropathy of left upper extremity 10/13/2014   AC (acromioclavicular) arthritis 04/04/2013   CMC arthritis, thumb, degenerative 06/14/2012    PCP: Azalia Leo, MD  REFERRING PROVIDER: Skeeter Dukes, MD  REFERRING DIAG: [phone] (  ICD-10-CM) - Spondylosis without myelopathy or radiculopathy, lumbar region  Rationale for Evaluation and Treatment: Rehabilitation  THERAPY DIAG:  Pain in left hip  Cramp and spasm  Difficulty in walking, not elsewhere classified  Muscle weakness (generalized)  Other low back pain  Other muscle spasm  Abnormal posture  ONSET DATE: 03/14/2024  SUBJECTIVE:                                                                                                                                                                                            SUBJECTIVE STATEMENT: Patient reports the needling seems to be helping a lot and that everything he has learned is really helping with his posture. He realizes that as he gets a better lumbar lordosis, he is able to also bring his shoulders back and stand more erect from the chest up.     PERTINENT HISTORY:  Full thickness glute tear on the Lt-per pt report   PAIN:  05/18/24 Are you having pain? Yes: NPRS scale: with walking 0-7/10 Pain location: left low back and glute Pain description: aching Aggravating factors: standing, bending, walking 1/4 mile  Relieving factors: rest, meds  PRECAUTIONS: None  RED FLAGS: None   WEIGHT BEARING RESTRICTIONS: No  FALLS:  Has patient fallen in last 6 months? No  LIVING ENVIRONMENT: Lives with: lives with their spouse Lives in: House/apartment  OCCUPATION: retired Art gallery manager  PLOF: Independent, Independent with basic ADLs, Independent with household mobility without device, Independent with community mobility without device, Independent with homemaking with ambulation, Independent with gait, and Independent with transfers  PATIENT GOALS: He hopes to regain ability to be active and workout.  NEXT MD VISIT: prn  OBJECTIVE:  Note: Objective measures were completed at Evaluation unless otherwise noted.  DIAGNOSTIC FINDINGS:  12/14/2020 IMPRESSION: 1. L5 is numbered as a transitional vertebra to remain consistent with the previous report from December 2015. We would have to count down from the cervical region for true anatomic numbering. 2. T12-L1: No change. Osteophytes and shallow disc protrusion indent the thecal sac but do not appear to cause neural compression. 3. L1-2: No change.  Mild non-compressive spondylosis. 4. L2-3: Endplate osteophytes and bulging of the disc. Mild bilateral lateral recess stenosis but without definite neural compression. Slight worsening  since 2018. Chronic discogenic endplate marrow changes, but with some persistent edema, which could be associated with regional back pain 5. L3-4: Newly seen discogenic endplate edema which could relate to back pain. Endplate osteophytes and bulging of the disc. Left foraminal narrowing that could affect the exiting L3 nerve. This foraminal stenosis is similar to the study of 2018. 6.  L4-5: Chronic central disc protrusion with slight caudal down turning. This indents the thecal sac and contacts the S1 root sleeves, more on the left. This is unchanged from previous studies  COGNITION: Overall cognitive status: Within functional limits for tasks assessed     SENSATION: WFL  MUSCLE LENGTH: Hamstrings: Right 45 deg; Left 50 deg Thomas test: Right pos; Left pos  POSTURE: rounded shoulders, forward head, decreased lumbar lordosis, posterior pelvic tilt, and flexed trunk   PALPATION: Mild scoliosis and trunk shift noted on palpation of lumbar spine and lower back  LUMBAR ROM:   AROM eval  Flexion WFL  Extension WFL  Right lateral flexion WFL  Left lateral flexion WFL  Right rotation WNL  Left rotation WNL   (Blank rows = not tested)  LOWER EXTREMITY ROM:     WFL  LOWER EXTREMITY MMT:    All 4+ to 5/5 bilateral LE's  LUMBAR SPECIAL TESTS:  Straight leg raise test: Negative  FUNCTIONAL TESTS:  5 times sit to stand: 6.49 sec Timed up and go (TUG): 7.48 sec  GAIT: Distance walked: 30 feet Assistive device utilized: None Level of assistance: Complete Independence Comments: short step length but good heel strike, slight trendelenburg on right  TREATMENT DATE:  05/18/24: Recumbent bike x 5 min level 5 Supine iron cross stretch 5 x each LE holding 10 sec each Happy baby stretch (attempted but patient unable to get into position) Trigger Point Dry Needling Subsequent Treatment: Instructions provided previously at initial dry needling treatment.  Patient Verbal Consent  Given: Yes Education Handout Provided: Previously Provided Muscles Treated: left glut max, min and med, piriformis (all on left) Electrical Stimulation Performed: No Treatment Response/Outcome: Utilized skilled palpation to identify bony landmarks and trigger points.  Able to illicit twitch response and muscle elongation.   Soft tissue mobilization to muscles needled following DN to further promote tissue elongation and decreased pain.      04/26/24: NuStep: Level 5 x 8 minutes- PT present to discuss progress ERO complete  Seated piriformis stretch 3x20 seconds  Trigger Point Dry Needling Subsequent Treatment: Instructions provided previously at initial dry needling treatment.  Patient Verbal Consent Given: Yes Education Handout Provided: Previously Provided Muscles Treated: lumbar multifidi left, left glut max, min and med, piriformis (all on left) Electrical Stimulation Performed: No Treatment Response/Outcome: Utilized skilled palpation to identify bony landmarks and trigger points.  Able to illicit twitch response and muscle elongation.   Soft tissue mobilization to muscles needled following DN to further promote tissue elongation and decreased pain.      [facility] Adult PT Treatment:                                                DATE: 05/03/24 Pt seen for aquatic therapy today.  Treatment took place in water 3.5-4.75 ft in depth at the [facility] pool. Temp of water was 91.  Pt entered/exited the pool via stair using alternating pattern with hand rail. *Intro to setting *walking forward, back and side stepping in 4.0 ft unsupported *Yellow HB pull down for TrA engagement wide stance then staggered x 10 *Arm swing with red hand bells 8 slow-8 fast x 3 sets in wide stance then staggered *Suspended supine with chin on chest TrA engagement 5 reps with 10s hold *Knees to chest 2 x 5 in above position. *Prone suspension  using blue HB upright fly 2 x 5 Pt requires the buoyancy and  hydrostatic pressure of water for support, and to offload joints by unweighting joint load by at least 50 % in navel deep water and by at least 75-80% in chest to neck deep water.  Viscosity of the water is needed for resistance of strengthening. Water current perturbations provides challenge to standing balance requiring increased core activation.    PATIENT EDUCATION:  Education details: See above, also initiated HEP, also educated on dry needling  Person educated: Patient Education method: Explanation, Demonstration, Verbal cues, and Handouts Education comprehension: verbalized understanding, returned demonstration, and verbal cues required  HOME EXERCISE PROGRAM: Access Code: [email or web address] URL: [email or web address] Date: 04/06/2024 Prepared by: Minor Amble Beuhring  Exercises - Standing Hamstring Stretch on Chair  - 1 x daily - 7 x weekly - 1 sets - 3 reps - 30 sec hold - Quadricep Stretch with Chair and Counter Support  - 1 x daily - 7 x weekly - 1 sets - 3 reps - 30 sec hold - Seated Figure 4 Piriformis Stretch  - 1 x daily - 7 x weekly - 1 sets - 3 reps - 30 sed hold - Foam Roll 90/90 March  - 1 x daily - 7 x weekly - 1 sets - 10 reps - Squat with Chair Touch and Resistance Loop  - 1 x daily - 7 x weekly - 2 sets - 10 reps - Hip Abduction with Resistance Loop  - 1 x daily - 7 x weekly - 2 sets - 10 reps - Hip Extension with Resistance Loop  - 1 x daily - 7 x weekly - 2 sets - 10 reps - Standing Lumbar Extension  - 1 x daily - 7 x weekly - 1 sets - 10 reps - Standing Lumbar Extension with Counter  - 1 x daily - 7 x weekly - 1 sets - 10 reps  ASSESSMENT:  CLINICAL IMPRESSION: Moriah is progressing appropriately. He seems to still have the gluteal pain but it doesn't start as early into his walk and it does not appear to be as intense.  Patient will benefit from skilled PT to address the below impairments and improve overall function.    OBJECTIVE IMPAIRMENTS: Abnormal gait,  decreased mobility, difficulty walking, decreased strength, increased fascial restrictions, increased muscle spasms, impaired flexibility, postural dysfunction, and pain.   ACTIVITY LIMITATIONS: lifting, bending, standing, squatting, sleeping, stairs, transfers, bed mobility, bathing, and dressing  PARTICIPATION LIMITATIONS: meal prep, cleaning, laundry, driving, shopping, community activity, and yard work  PERSONAL FACTORS: Age, Past/current experiences, Time since onset of injury/illness/exacerbation, and 1-2 comorbidities: OA and Htn are also affecting patient's functional outcome.   REHAB POTENTIAL: Good  CLINICAL DECISION MAKING: Stable/uncomplicated  EVALUATION COMPLEXITY: Low   GOALS: Goals reviewed with patient? Yes  SHORT TERM GOALS: Target date: 04/12/2024  Pain report to be no greater than 4/10  Baseline: Goal status: IN PROGRESS 04/11/24  2.  Patient will be independent with initial HEP  Baseline:  Goal status: MET  3.  Patient to report 50% reduction in overall symptoms  Baseline: 75% (04/26/24) Goal status: MET   4.  Patient to be able to walk at least 1/2 mile without increase in pain Baseline:  Goal status: IN PROGRESS 04/11/24 1/4 MILE   LONG TERM GOALS: Target date: 06/23/2024    Patient to report pain no greater than 2/10  Baseline: 7/10 in Lt upper glute with walking >1/4 mile (05/12/24) Goal status: In  progress  2.  Patient to be independent with advanced HEP  Baseline:  Goal status: In progress  3.  Patient to report 85% improvement in overall symptoms  Baseline: able to walk full 3 miles but pain at 1/4 mile Goal status: In progress  4.  Patient to be able to stand or walk for at least 15 min without left low back and buttock pain Baseline: 7/10 Goal status: in progress   5.  Patient to demonstrate improved posture including awareness of lumbar lordosis  Baseline: working on this (04/26/24) Goal status: in progress   6.  Patient to be able  to resume his regular 3 mile walk.  Baseline: pain at 1/4 mile and is able to push through to 3 miles with 7/10 pain in Lt gluteal (05/12/24) Goal status:  in progress   PLAN:  PT FREQUENCY: 1-2x/week  PT DURATION: 6 weeks  PLANNED INTERVENTIONS: 97110-Therapeutic exercises, 97530- Therapeutic activity, 97112- Neuromuscular re-education, 97535- Self Care, [id number]- Manual therapy, [id number]- Gait training, [phone]- Aquatic Therapy, [phone]- Electrical stimulation (unattended), [phone]- Electrical stimulation (manual), [id number]- Ultrasound, [id number]- Traction (mechanical), [id number]- Ionotophoresis 4mg /ml Dexamethasone , Patient/Family education, Balance training, Stair training, Taping, Dry Needling, Joint mobilization, Spinal mobilization, Cryotherapy, and Moist heat.  PLAN FOR NEXT SESSION: Pt would like to continue for a few more sessions of dry needling as that is helping him the most.  Adjust HEP as needed- he is consistent and compliant.    Bridgette Campus B. Carsin Randazzo, PT 05/18/24 12:15 PM [facility] [address], [address] [place], [place] [address] Phone # [phone] Fax [phone]

## 2024-05-24 ENCOUNTER — Ambulatory Visit

## 2024-05-24 DIAGNOSIS — R252 Cramp and spasm: Secondary | ICD-10-CM

## 2024-05-24 DIAGNOSIS — M6281 Muscle weakness (generalized): Secondary | ICD-10-CM

## 2024-05-24 DIAGNOSIS — R262 Difficulty in walking, not elsewhere classified: Secondary | ICD-10-CM

## 2024-05-24 DIAGNOSIS — M5459 Other low back pain: Secondary | ICD-10-CM | POA: Diagnosis not present

## 2024-05-24 DIAGNOSIS — M25552 Pain in left hip: Secondary | ICD-10-CM

## 2024-05-24 NOTE — Therapy (Signed)
 OUTPATIENT PHYSICAL THERAPY THORACOLUMBAR TREATMENT   Patient Name: Willie Burton MRN: [id number] DOB:1946/02/04, 78 y.o., male Today's Date: 05/24/2024  END OF SESSION:  PT End of Session - 05/24/24 0753     Visit Number 13    Date for PT Re-Evaluation 06/23/24    Authorization Type Medicare    Progress Note Due on Visit 20    PT Start Time 0731    PT Stop Time 0758    PT Time Calculation (min) 27 min    Activity Tolerance Patient tolerated treatment well    Behavior During Therapy [facility] for tasks assessed/performed               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   Arthritis    Colon polyp    Coronary artery disease    Diverticula of colon    Dysrhythmia    PACs, PVCs   Heart disease    High blood pressure    High cholesterol    Hyperlipemia    Peripheral vascular disease (HCC)    Prostate cancer (HCC)    Past Surgical History:  Procedure Laterality Date   ABDOMINAL AORTOGRAM W/LOWER EXTREMITY N/A 10/16/2017   Procedure: ABDOMINAL AORTOGRAM W/LOWER EXTREMITY;  Surgeon: Harvey Carlin BRAVO, MD;  Location: [facility];  Service: Cardiovascular;  Laterality: N/A;   APPENDECTOMY     CARDIAC CATHETERIZATION N/A 11/13/2015   Procedure: Left Heart Cath and Coronary Angiography;  Surgeon: Vinie DELENA Jude, MD;  Location: [facility] INVASIVE CV [facility];  Service: Cardiovascular;  Laterality: N/A;   CATARACT EXTRACTION     COLON SURGERY  2008   right colon resection   COLONOSCOPY WITH PROPOFOL  N/A 01/06/2018   Procedure: COLONOSCOPY WITH PROPOFOL ;  Surgeon: Toledo, Ladell POUR, MD;  Location: [facility] ENDOSCOPY;  Service: Gastroenterology;  Laterality: N/A;   ENDARTERECTOMY POPLITEAL Right 11/11/2017   FEMORAL-POPLITEAL BYPASS GRAFT Right 11/11/2017   Procedure: RIGHT POPLITEAL ENDARTERECTOMY;  Surgeon: Harvey Carlin BRAVO, MD;  Location: [facility];  Service: Vascular;  Laterality: Right;   LIPOMA EXCISION Right    thigh    METACARPOPHALANGEAL JOINT ARTHROPLASTY     PATCH ANGIOPLASTY Right 11/11/2017   Procedure: PATCH ANGIOPLASTY of Superficial Fenoral Artery;  Surgeon: Harvey Carlin BRAVO, MD;  Location: [facility];  Service: Vascular;  Laterality: Right;   PROSTATECTOMY  2003   ULNAR NERVE TRANSPOSITION Left January 2015   Patient Active Problem List   Diagnosis Date Noted   History of colonic polyps 10/19/2023   Primary hypertension 10/17/2021   PAD (peripheral artery disease) (HCC) 10/09/2021   Hyperlipidemia LDL goal <70 10/08/2021   Other microscopic hematuria 10/08/2021   B12 deficiency 06/07/2021   Arthritis of right hand 04/11/2021   Achilles tendonitis, bilateral 03/16/2020   Bilateral Achilles tendonosis 05/16/2019   Claudication of calf muscles (HCC) 08/11/2017   Chronic bilateral low back pain with right-sided sciatica 08/11/2017   Carotid stenosis 05/05/2017   Presbycusis of both ears 03/20/2017   Pulsatile tinnitus of both ears 03/20/2017   H/O coronary artery bypass surgery 11/20/2015   Atherosclerotic heart disease of native coronary artery without angina pectoris 11/15/2015   HNP (herniated nucleus pulposus), lumbar 11/15/2014   Lumbar radiculitis 11/15/2014   Ulnar neuropathy of left upper extremity 10/13/2014   AC (acromioclavicular) arthritis 04/04/2013   CMC arthritis, thumb, degenerative 06/14/2012    PCP: Stephane Leita DEL, MD  REFERRING PROVIDER: Dreama Ozell Vinie, MD  REFERRING DIAG:  M47.816 (ICD-10-CM) - Spondylosis without myelopathy or radiculopathy, lumbar region  Rationale for Evaluation and Treatment: Rehabilitation  THERAPY DIAG:  Pain in left hip  Cramp and spasm  Difficulty in walking, not elsewhere classified  Muscle weakness (generalized)  Other low back pain  ONSET DATE: 03/14/2024  SUBJECTIVE:                                                                                                                                                                                            SUBJECTIVE STATEMENT: Pain comes on at the same time in my walk and it is now much smaller area and much less intensity.  Max pain of 4/10.  50% better since treatment last week.    PERTINENT HISTORY:  Full thickness glute tear on the Lt-per pt report   PAIN:  05/24/24 Are you having pain? Yes: NPRS scale: with walking 0-4/10 Pain location: left low back and glute Pain description: aching Aggravating factors: standing, bending, walking 1/4 mile  Relieving factors: rest, meds  PRECAUTIONS: None  RED FLAGS: None   WEIGHT BEARING RESTRICTIONS: No  FALLS:  Has patient fallen in last 6 months? No  LIVING ENVIRONMENT: Lives with: lives with their spouse Lives in: House/apartment  OCCUPATION: retired Art gallery manager  PLOF: Independent, Independent with basic ADLs, Independent with household mobility without device, Independent with community mobility without device, Independent with homemaking with ambulation, Independent with gait, and Independent with transfers  PATIENT GOALS: He hopes to regain ability to be active and workout.  NEXT MD VISIT: prn  OBJECTIVE:  Note: Objective measures were completed at Evaluation unless otherwise noted.  DIAGNOSTIC FINDINGS:  12/14/2020 IMPRESSION: 1. L5 is numbered as a transitional vertebra to remain consistent with the previous report from December 2015. We would have to count down from the cervical region for true anatomic numbering. 2. T12-L1: No change. Osteophytes and shallow disc protrusion indent the thecal sac but do not appear to cause neural compression. 3. L1-2: No change.  Mild non-compressive spondylosis. 4. L2-3: Endplate osteophytes and bulging of the disc. Mild bilateral lateral recess stenosis but without definite neural compression. Slight worsening since 2018. Chronic discogenic endplate marrow changes, but with some persistent edema, which could be associated with regional back pain 5. L3-4:  Newly seen discogenic endplate edema which could relate to back pain. Endplate osteophytes and bulging of the disc. Left foraminal narrowing that could affect the exiting L3 nerve. This foraminal stenosis is similar to the study of 2018. 6. L4-5: Chronic central disc protrusion with slight caudal down turning. This indents the thecal sac and contacts the S1 root sleeves, more on  the left. This is unchanged from previous studies  COGNITION: Overall cognitive status: Within functional limits for tasks assessed     SENSATION: WFL  MUSCLE LENGTH: Hamstrings: Right 45 deg; Left 50 deg Thomas test: Right pos; Left pos  POSTURE: rounded shoulders, forward head, decreased lumbar lordosis, posterior pelvic tilt, and flexed trunk   PALPATION: Mild scoliosis and trunk shift noted on palpation of lumbar spine and lower back  LUMBAR ROM:   AROM eval  Flexion WFL  Extension WFL  Right lateral flexion WFL  Left lateral flexion WFL  Right rotation WNL  Left rotation WNL   (Blank rows = not tested)  LOWER EXTREMITY ROM:     WFL  LOWER EXTREMITY MMT:    All 4+ to 5/5 bilateral LE's  LUMBAR SPECIAL TESTS:  Straight leg raise test: Negative  FUNCTIONAL TESTS:  5 times sit to stand: 6.49 sec Timed up and go (TUG): 7.48 sec  GAIT: Distance walked: 30 feet Assistive device utilized: None Level of assistance: Complete Independence Comments: short step length but good heel strike, slight trendelenburg on right  TREATMENT DATE:   05/24/24: Trigger Point Dry Needling Subsequent Treatment: Instructions provided previously at initial dry needling treatment.  Patient Verbal Consent Given: Yes Education Handout Provided: Previously Provided Muscles Treated: left glut max, min and med, piriformis (all on left) Electrical Stimulation Performed: No Treatment Response/Outcome: Utilized skilled palpation to identify bony landmarks and trigger points.  Able to illicit twitch response and  muscle elongation.   Soft tissue mobilization to muscles needled following DN to further promote tissue elongation and decreased pain.     05/18/24: Recumbent bike x 5 min level 5 Supine iron cross stretch 5 x each LE holding 10 sec each Happy baby stretch (attempted but patient unable to get into position) Trigger Point Dry Needling Subsequent Treatment: Instructions provided previously at initial dry needling treatment.  Patient Verbal Consent Given: Yes Education Handout Provided: Previously Provided Muscles Treated: left glut max, min and med, piriformis (all on left) Electrical Stimulation Performed: No Treatment Response/Outcome: Utilized skilled palpation to identify bony landmarks and trigger points.  Able to illicit twitch response and muscle elongation.   Soft tissue mobilization to muscles needled following DN to further promote tissue elongation and decreased pain.      04/26/24: NuStep: Level 5 x 8 minutes- PT present to discuss progress ERO complete  Seated piriformis stretch 3x20 seconds  Trigger Point Dry Needling Subsequent Treatment: Instructions provided previously at initial dry needling treatment.  Patient Verbal Consent Given: Yes Education Handout Provided: Previously Provided Muscles Treated: lumbar multifidi left, left glut max, min and med, piriformis (all on left) Electrical Stimulation Performed: No Treatment Response/Outcome: Utilized skilled palpation to identify bony landmarks and trigger points.  Able to illicit twitch response and muscle elongation.   Soft tissue mobilization to muscles needled following DN to further promote tissue elongation and decreased pain.       PATIENT EDUCATION:  Education details: See above, also initiated HEP, also educated on dry needling  Person educated: Patient Education method: Explanation, Demonstration, Verbal cues, and Handouts Education comprehension: verbalized understanding, returned demonstration, and verbal cues  required  HOME EXERCISE PROGRAM: Access Code: [email or web address] URL: [email or web address] Date: 04/06/2024 Prepared by: Orvil Beuhring  Exercises - Standing Hamstring Stretch on Chair  - 1 x daily - 7 x weekly - 1 sets - 3 reps - 30 sec hold - Quadricep Stretch with Chair and Counter Support  - 1 x daily - 7  x weekly - 1 sets - 3 reps - 30 sec hold - Seated Figure 4 Piriformis Stretch  - 1 x daily - 7 x weekly - 1 sets - 3 reps - 30 sed hold - Foam Roll 90/90 March  - 1 x daily - 7 x weekly - 1 sets - 10 reps - Squat with Chair Touch and Resistance Loop  - 1 x daily - 7 x weekly - 2 sets - 10 reps - Hip Abduction with Resistance Loop  - 1 x daily - 7 x weekly - 2 sets - 10 reps - Hip Extension with Resistance Loop  - 1 x daily - 7 x weekly - 2 sets - 10 reps - Standing Lumbar Extension  - 1 x daily - 7 x weekly - 1 sets - 10 reps - Standing Lumbar Extension with Counter  - 1 x daily - 7 x weekly - 1 sets - 10 reps  ASSESSMENT:  CLINICAL IMPRESSION: Pt reports 50% reduction in pain since last needling session and pain is 4/10 max with walking.  Pain covers a smaller region of his gluteals.  Pt is independent and compliant with HEP.  Good response to dry needling with improved tissue mobility and twitch response. He will schedule 1 additional appointment for dry needling.  Patient will benefit from skilled PT to address the below impairments and improve overall function.    OBJECTIVE IMPAIRMENTS: Abnormal gait, decreased mobility, difficulty walking, decreased strength, increased fascial restrictions, increased muscle spasms, impaired flexibility, postural dysfunction, and pain.   ACTIVITY LIMITATIONS: lifting, bending, standing, squatting, sleeping, stairs, transfers, bed mobility, bathing, and dressing  PARTICIPATION LIMITATIONS: meal prep, cleaning, laundry, driving, shopping, community activity, and yard work  PERSONAL FACTORS: Age, Past/current experiences, Time since onset  of injury/illness/exacerbation, and 1-2 comorbidities: OA and Htn are also affecting patient's functional outcome.   REHAB POTENTIAL: Good  CLINICAL DECISION MAKING: Stable/uncomplicated  EVALUATION COMPLEXITY: Low   GOALS: Goals reviewed with patient? Yes  SHORT TERM GOALS: Target date: 04/12/2024  Pain report to be no greater than 4/10  Baseline: Goal status: IN PROGRESS 04/11/24  2.  Patient will be independent with initial HEP  Baseline:  Goal status: MET  3.  Patient to report 50% reduction in overall symptoms  Baseline: 75% (04/26/24) Goal status: MET   4.  Patient to be able to walk at least 1/2 mile without increase in pain Baseline:  Goal status: IN PROGRESS 04/11/24 1/4 MILE   LONG TERM GOALS: Target date: 06/23/2024    Patient to report pain no greater than 2/10  Baseline: 7/10 in Lt upper glute with walking >1/4 mile (05/12/24) Goal status: In progress  2.  Patient to be independent with advanced HEP  Baseline:  Goal status: In progress  3.  Patient to report 85% improvement in overall symptoms  Baseline: able to walk full 3 miles but pain at 1/4 mile Goal status: In progress  4.  Patient to be able to stand or walk for at least 15 min without left low back and buttock pain Baseline: 7/10 Goal status: in progress   5.  Patient to demonstrate improved posture including awareness of lumbar lordosis  Baseline: working on this (04/26/24) Goal status: in progress   6.  Patient to be able to resume his regular 3 mile walk.  Baseline: pain at 1/4 mile and is able to push through to 3 miles with 7/10 pain in Lt gluteal (05/12/24) Goal status:  in progress  PLAN:  PT FREQUENCY: 1-2x/week  PT DURATION: 6 weeks  PLANNED INTERVENTIONS: 97110-Therapeutic exercises, 97530- Therapeutic activity, [id number]- Neuromuscular re-education, 97535- Self Care, [id number]- Manual therapy, [phone]- Gait training, [phone]- Aquatic Therapy, [phone]- Electrical stimulation (unattended),  [phone]- Electrical stimulation (manual), [id number]- Ultrasound, [id number]- Traction (mechanical), [id number]- Ionotophoresis 4mg /ml Dexamethasone , Patient/Family education, Balance training, Stair training, Taping, Dry Needling, Joint mobilization, Spinal mobilization, Cryotherapy, and Moist heat.  PLAN FOR NEXT SESSION:1 more session for dry needling.   Adjust HEP as needed- he is consistent and compliant.    Burnard Joy, PT 05/24/24 8:11 AM  [facility] [address], [address] [place], [place] [address] Phone # [phone] Fax [phone]

## 2024-05-31 ENCOUNTER — Encounter

## 2024-06-07 ENCOUNTER — Ambulatory Visit: Attending: Internal Medicine

## 2024-06-07 DIAGNOSIS — M25552 Pain in left hip: Secondary | ICD-10-CM | POA: Insufficient documentation

## 2024-06-07 DIAGNOSIS — M62838 Other muscle spasm: Secondary | ICD-10-CM | POA: Diagnosis present

## 2024-06-07 DIAGNOSIS — R252 Cramp and spasm: Secondary | ICD-10-CM | POA: Insufficient documentation

## 2024-06-07 DIAGNOSIS — R262 Difficulty in walking, not elsewhere classified: Secondary | ICD-10-CM | POA: Insufficient documentation

## 2024-06-07 DIAGNOSIS — M6281 Muscle weakness (generalized): Secondary | ICD-10-CM | POA: Diagnosis present

## 2024-06-07 DIAGNOSIS — M5459 Other low back pain: Secondary | ICD-10-CM | POA: Insufficient documentation

## 2024-06-07 NOTE — Therapy (Signed)
 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   Arthritis    Colon polyp    Coronary artery disease    Diverticula of colon    Dysrhythmia    PACs, PVCs   Heart disease    High blood pressure    High cholesterol    Hyperlipemia    Peripheral vascular disease (HCC)    Prostate cancer (HCC)    Past Surgical History:  Procedure Laterality Date   ABDOMINAL AORTOGRAM W/LOWER EXTREMITY N/A 10/16/2017   Procedure: ABDOMINAL AORTOGRAM W/LOWER EXTREMITY;  Surgeon: Harvey Carlin BRAVO, MD;  Location: [facility];  Service: Cardiovascular;  Laterality: N/A;   APPENDECTOMY     CARDIAC CATHETERIZATION N/A 11/13/2015   Procedure: Left Heart Cath and Coronary Angiography;  Surgeon: Vinie DELENA Jude, MD;  Location: [facility] INVASIVE CV [facility];  Service: Cardiovascular;  Laterality: N/A;   CATARACT EXTRACTION     COLON SURGERY  2008   right colon resection   COLONOSCOPY WITH PROPOFOL  N/A 01/06/2018   Procedure: COLONOSCOPY WITH PROPOFOL ;  Surgeon: Toledo, Ladell POUR, MD;  Location: [facility] ENDOSCOPY;  Service: Gastroenterology;  Laterality: N/A;   ENDARTERECTOMY POPLITEAL Right 11/11/2017   FEMORAL-POPLITEAL BYPASS GRAFT Right 11/11/2017   Procedure: RIGHT POPLITEAL ENDARTERECTOMY;  Surgeon: Harvey Carlin BRAVO, MD;  Location: [facility] OR;  Service: Vascular;  Laterality: Right;   LIPOMA EXCISION Right    thigh   METACARPOPHALANGEAL JOINT ARTHROPLASTY     PATCH ANGIOPLASTY Right 11/11/2017    Procedure: PATCH ANGIOPLASTY of Superficial Fenoral Artery;  Surgeon: Harvey Carlin BRAVO, MD;  Location: [facility] OR;  Service: Vascular;  Laterality: Right;   PROSTATECTOMY  2003   ULNAR NERVE TRANSPOSITION Left January 2015   Patient Active Problem List   Diagnosis Date Noted   History of colonic polyps 10/19/2023   Primary hypertension 10/17/2021   PAD (peripheral artery disease) (HCC) 10/09/2021   Hyperlipidemia LDL goal <70 10/08/2021   Other microscopic hematuria 10/08/2021   B12 deficiency 06/07/2021   Arthritis of right hand 04/11/2021   Achilles tendonitis, bilateral 03/16/2020   Bilateral Achilles tendonosis 05/16/2019   Claudication of calf muscles (HCC) 08/11/2017   Chronic bilateral low back pain with right-sided sciatica 08/11/2017   Carotid stenosis 05/05/2017   Presbycusis of both ears 03/20/2017   Pulsatile tinnitus of both ears 03/20/2017   H/O coronary artery bypass surgery 11/20/2015   Atherosclerotic heart disease of native coronary artery without angina pectoris 11/15/2015   HNP (herniated nucleus pulposus), lumbar 11/15/2014   Lumbar radiculitis 11/15/2014   Ulnar neuropathy of left upper extremity 10/13/2014   AC (acromioclavicular) arthritis 04/04/2013   CMC arthritis, thumb, degenerative 06/14/2012    PCP: Stephane Leita DEL, MD  REFERRING PROVIDER: Dreama Ozell Vinie, MD  REFERRING DIAG: [phone] (ICD-10-CM) - Spondylosis without myelopathy or radiculopathy, lumbar region  Rationale for Evaluation and Treatment:  Rehabilitation  THERAPY DIAG:  Pain in left hip  Cramp and spasm  Difficulty in walking, not elsewhere classified  Muscle weakness (generalized)  Other low back pain  Other muscle spasm  ONSET DATE: 03/14/2024  SUBJECTIVE:                                                                                                                                                                                           SUBJECTIVE STATEMENT: I still  have pain when I walk on inclines.  I am 75% better.    PERTINENT HISTORY:  Full thickness glute tear on the Lt-per pt report   PAIN:  06/07/24 Are you having pain? Yes: NPRS scale: with walking 0-4/10 Pain location: left low back and glute Pain description: aching Aggravating factors: standing, bending, walking 1/4 mile  Relieving factors: rest, meds  PRECAUTIONS: None  RED FLAGS: None   WEIGHT BEARING RESTRICTIONS: No  FALLS:  Has patient fallen in last 6 months? No  LIVING ENVIRONMENT: Lives with: lives with their spouse Lives in: House/apartment  OCCUPATION: retired Art gallery manager  PLOF: Independent, Independent with basic ADLs, Independent with household mobility without device, Independent with community mobility without device, Independent with homemaking with ambulation, Independent with gait, and Independent with transfers  PATIENT GOALS: He hopes to regain ability to be active and workout.  NEXT MD VISIT: prn  OBJECTIVE:  Note: Objective measures were completed at Evaluation unless otherwise noted.  DIAGNOSTIC FINDINGS:  12/14/2020 IMPRESSION: 1. L5 is numbered as a transitional vertebra to remain consistent with the previous report from December 2015. We would have to count down from the cervical region for true anatomic numbering. 2. T12-L1: No change. Osteophytes and shallow disc protrusion indent the thecal sac but do not appear to cause neural compression. 3. L1-2: No change.  Mild non-compressive spondylosis. 4. L2-3: Endplate osteophytes and bulging of the disc. Mild bilateral lateral recess stenosis but without definite neural compression. Slight worsening since 2018. Chronic discogenic endplate marrow changes, but with some persistent edema, which could be associated with regional back pain 5. L3-4: Newly seen discogenic endplate edema which could relate to back pain. Endplate osteophytes and bulging of the disc. Left foraminal narrowing that could  affect the exiting L3 nerve. This foraminal stenosis is similar to the study of 2018. 6. L4-5: Chronic central disc protrusion with slight caudal down turning. This indents the thecal sac and contacts the S1 root sleeves, more on the left. This is unchanged from previous studies  COGNITION: Overall cognitive status: Within functional limits for tasks assessed     SENSATION: WFL  MUSCLE LENGTH: Hamstrings: Right 45  deg; Left 50 deg Thomas test: Right pos; Left pos  POSTURE: rounded shoulders, forward head, decreased lumbar lordosis, posterior pelvic tilt, and flexed trunk   PALPATION: Mild scoliosis and trunk shift noted on palpation of lumbar spine and lower back  LUMBAR ROM:   AROM eval  Flexion WFL  Extension WFL  Right lateral flexion WFL  Left lateral flexion WFL  Right rotation WNL  Left rotation WNL   (Blank rows = not tested)  LOWER EXTREMITY ROM:     WFL  LOWER EXTREMITY MMT:    All 4+ to 5/5 bilateral LE's  LUMBAR SPECIAL TESTS:  Straight leg raise test: Negative  FUNCTIONAL TESTS:  5 times sit to stand: 6.49 sec Timed up and go (TUG): 7.48 sec  GAIT: Distance walked: 30 feet Assistive device utilized: None Level of assistance: Complete Independence Comments: short step length but good heel strike, slight trendelenburg on right  TREATMENT DATE:   06/07/24: Trigger Point Dry Needling Subsequent Treatment: Instructions provided previously at initial dry needling treatment.  Patient Verbal Consent Given: Yes Education Handout Provided: Previously Provided Muscles Treated: left glut max, min and med, piriformis (all on left) Electrical Stimulation Performed: No Treatment Response/Outcome: Utilized skilled palpation to identify bony landmarks and trigger points.  Able to illicit twitch response and muscle elongation.   Soft tissue mobilization to muscles needled following DN to further promote tissue elongation and decreased pain.      05/24/24: Trigger Point Dry Needling Subsequent Treatment: Instructions provided previously at initial dry needling treatment.  Patient Verbal Consent Given: Yes Education Handout Provided: Previously Provided Muscles Treated: left glut max, min and med, piriformis (all on left) Electrical Stimulation Performed: No Treatment Response/Outcome: Utilized skilled palpation to identify bony landmarks and trigger points.  Able to illicit twitch response and muscle elongation.   Soft tissue mobilization to muscles needled following DN to further promote tissue elongation and decreased pain.     05/18/24: Recumbent bike x 5 min level 5 Supine iron cross stretch 5 x each LE holding 10 sec each Happy baby stretch (attempted but patient unable to get into position) Trigger Point Dry Needling Subsequent Treatment: Instructions provided previously at initial dry needling treatment.  Patient Verbal Consent Given: Yes Education Handout Provided: Previously Provided Muscles Treated: left glut max, min and med, piriformis (all on left) Electrical Stimulation Performed: No Treatment Response/Outcome: Utilized skilled palpation to identify bony landmarks and trigger points.  Able to illicit twitch response and muscle elongation.   Soft tissue mobilization to muscles needled following DN to further promote tissue elongation and decreased pain.       PATIENT EDUCATION:  Education details: See above, also initiated HEP, also educated on dry needling  Person educated: Patient Education method: Explanation, Demonstration, Verbal cues, and Handouts Education comprehension: verbalized understanding, returned demonstration, and verbal cues required  HOME EXERCISE PROGRAM: Access Code: [email or web address] URL: [email or web address] Date: 04/06/2024 Prepared by: Orvil Beuhring  Exercises - Standing Hamstring Stretch on Chair  - 1 x daily - 7 x weekly - 1 sets - 3 reps - 30 sec hold - Quadricep  Stretch with Chair and Counter Support  - 1 x daily - 7 x weekly - 1 sets - 3 reps - 30 sec hold - Seated Figure 4 Piriformis Stretch  - 1 x daily - 7 x weekly - 1 sets - 3 reps - 30 sed hold - Foam Roll 90/90 March  - 1 x daily - 7 x weekly - 1 sets -  10 reps - Squat with Chair Touch and Resistance Loop  - 1 x daily - 7 x weekly - 2 sets - 10 reps - Hip Abduction with Resistance Loop  - 1 x daily - 7 x weekly - 2 sets - 10 reps - Hip Extension with Resistance Loop  - 1 x daily - 7 x weekly - 2 sets - 10 reps - Standing Lumbar Extension  - 1 x daily - 7 x weekly - 1 sets - 10 reps - Standing Lumbar Extension with Counter  - 1 x daily - 7 x weekly - 1 sets - 10 reps  ASSESSMENT:  CLINICAL IMPRESSION: Pt reports 75% reduction in pain since last needling session and pain is 4/10 max with walking.  Pain covers a smaller region of his gluteals.  Pt is independent and compliant with HEP.  Good response to dry needling with improved tissue mobility and twitch response. He will continue with HEP after D/C.   OBJECTIVE IMPAIRMENTS: Abnormal gait, decreased mobility, difficulty walking, decreased strength, increased fascial restrictions, increased muscle spasms, impaired flexibility, postural dysfunction, and pain.   ACTIVITY LIMITATIONS: lifting, bending, standing, squatting, sleeping, stairs, transfers, bed mobility, bathing, and dressing  PARTICIPATION LIMITATIONS: meal prep, cleaning, laundry, driving, shopping, community activity, and yard work  PERSONAL FACTORS: Age, Past/current experiences, Time since onset of injury/illness/exacerbation, and 1-2 comorbidities: OA and Htn are also affecting patient's functional outcome.   REHAB POTENTIAL: Good  CLINICAL DECISION MAKING: Stable/uncomplicated  EVALUATION COMPLEXITY: Low   GOALS: Goals reviewed with patient? Yes  SHORT TERM GOALS: Target date: 04/12/2024  Pain report to be no greater than 4/10  Baseline: Goal status: IN PROGRESS  04/11/24  2.  Patient will be independent with initial HEP  Baseline:  Goal status: MET  3.  Patient to report 50% reduction in overall symptoms  Baseline: 75% (04/26/24) Goal status: MET   4.  Patient to be able to walk at least 1/2 mile without increase in pain Baseline:  Goal status: IN PROGRESS 04/11/24 1/4 MILE   LONG TERM GOALS: Target date: 06/23/2024    Patient to report pain no greater than 2/10  Baseline:  pain with walking only up to 4/10 (06/07/24) Goal status: partially met  2.  Patient to be independent with advanced HEP  Baseline:  Goal status: MET  3.  Patient to report 85% improvement in overall symptoms  Baseline: able to walk full 3 miles and reports 75% reduction in Lt buttock (06/07/24) Goal status: In progress  4.  Patient to be able to stand or walk for at least 15 min without left low back and buttock pain Baseline: 3 miles, up to 4/10 (06/07/24) Goal status: in progress   5.  Patient to demonstrate improved posture including awareness of lumbar lordosis  Baseline: working on this (04/26/24) Goal status: in progress   6.  Patient to be able to resume his regular 3 mile walk.  Baseline: pain at 1/4 mile and is able to push through to 3 miles with 7/10 pain in Lt gluteal (05/12/24) Goal status:  partially met   PLAN:  PHYSICAL THERAPY DISCHARGE SUMMARY  Visits from Start of Care: 14  Current functional level related to goals / functional outcomes: 75% improvement since the start of care. Pain in Lt glute with walking on inclines.    Remaining deficits: Lt gluteal pain with walking on inclines.  Able to walk, has 4/10 Lt gluteal pain with this.    Education / Equipment:  HEP, dry needling    Patient agrees to discharge. Patient goals were partially met. Patient is being discharged due to being pleased with the current functional level.   Burnard Joy, PT 06/07/24 8:03 AM  [facility] [address], [address] [place], [place] [address] Phone # [phone] Fax [phone]

## 2024-06-20 ENCOUNTER — Other Ambulatory Visit (HOSPITAL_BASED_OUTPATIENT_CLINIC_OR_DEPARTMENT_OTHER): Payer: Self-pay | Admitting: Family

## 2024-06-20 DIAGNOSIS — I10 Essential (primary) hypertension: Secondary | ICD-10-CM

## 2024-08-09 ENCOUNTER — Other Ambulatory Visit: Payer: Self-pay

## 2024-08-09 DIAGNOSIS — I739 Peripheral vascular disease, unspecified: Secondary | ICD-10-CM

## 2024-08-15 ENCOUNTER — Encounter (HOSPITAL_BASED_OUTPATIENT_CLINIC_OR_DEPARTMENT_OTHER): Payer: Self-pay | Admitting: Family

## 2024-08-15 ENCOUNTER — Ambulatory Visit (HOSPITAL_BASED_OUTPATIENT_CLINIC_OR_DEPARTMENT_OTHER): Admitting: Family

## 2024-08-15 VITALS — BP 116/60 | HR 64 | Resp 16 | Ht 69.0 in | Wt 201.0 lb

## 2024-08-15 DIAGNOSIS — I25118 Atherosclerotic heart disease of native coronary artery with other forms of angina pectoris: Secondary | ICD-10-CM | POA: Diagnosis not present

## 2024-08-15 DIAGNOSIS — I10 Essential (primary) hypertension: Secondary | ICD-10-CM

## 2024-08-15 DIAGNOSIS — I739 Peripheral vascular disease, unspecified: Secondary | ICD-10-CM | POA: Diagnosis not present

## 2024-08-15 DIAGNOSIS — E785 Hyperlipidemia, unspecified: Secondary | ICD-10-CM | POA: Diagnosis not present

## 2024-08-15 DIAGNOSIS — I2510 Atherosclerotic heart disease of native coronary artery without angina pectoris: Secondary | ICD-10-CM

## 2024-08-15 MED ORDER — ROSUVASTATIN CALCIUM 5 MG PO TABS: 5.0000 mg | ORAL_TABLET | Freq: Every day | ORAL | 3 refills | Status: AC

## 2024-08-15 MED ORDER — EZETIMIBE 10 MG PO TABS: 10.0000 mg | ORAL_TABLET | Freq: Every day | ORAL | 3 refills | Status: AC

## 2024-08-15 MED ORDER — AMLODIPINE-OLMESARTAN 5-20 MG PO TABS: 1.0000 | ORAL_TABLET | Freq: Every day | ORAL | 3 refills | Status: AC

## 2024-08-15 NOTE — Progress Notes (Signed)
 Cardiology Office Note:  .   Date:  08/15/2024  ID:  Willie Burton, DOB March 09, 1946, MRN [id number] PCP: Stephane Leita DEL, MD  [facility] Providers Cardiologist:  Oneil Parchment, MD    History of Present Illness: .   Willie Burton is a 78 y.o. male with history of coronary artery disease s/p 11/2015 CABG x3 (LIMA-LAD, SVG-OM1, SVG-PDA), hyperlipidemia, hypertension, PVC.  At visits 05/2022 and 07/2023 he was doing well from a cardiac perspective.  Presents today for follow up. Recently got back from a trip to [place]. Average blood pressure over the last six months of 126/69. Continues to stay active walking and in his yard. Two recent episodes of lightheadedness one after a fast walk and one after shoveling in the garden. Does not believe he was dehydrated as is intentional about water intake. Endorses following a heart healthy diet.. He has upcoming visit with [facility] for evaluation of swelling in right upper leg. No significant leg pain with ambulation.   Reports no shortness of breath nor dyspnea on exertion. Reports no chest pain, pressure, or tightness. No  orthopnea, PND. Reports no palpitations.    ROS: Please see the history of present illness.    All other systems reviewed and are negative.   Studies Reviewed: .       Cardiac Studies & Procedures   ______________________________________________________________________________________________ CARDIAC CATHETERIZATION  CARDIAC CATHETERIZATION 11/13/2015  Conclusion  Prox RCA lesion, 70% stenosed.  Mid RCA to Dist RCA lesion, 99% stenosed.  Ost LM to LM lesion, 20% stenosed.  Ost LAD lesion, 80% stenosed.  The left ventricular systolic function is normal.  2 vessel heavily calcified disease with preserved lv function. Will need consideration for cabg.  Findings Coronary Findings Diagnostic  Dominance: Right  Left Main  Left Anterior Descending  Right Coronary Artery  Right Posterior Descending  Artery Collaterals RPDA filled by collaterals from Dist LAD.  Intervention  No interventions have been documented.   STRESS TESTS  MYOCARDIAL PERFUSION IMAGING 11/16/2023  Interpretation Summary   The study is normal. The study is low risk.   No ST deviation was noted.   LV perfusion is normal. There is no evidence of ischemia. There is no evidence of infarction.   Left ventricular function is normal. Nuclear stress EF: 63%. The left ventricular ejection fraction is normal (55-65%). End diastolic cavity size is normal. End systolic cavity size is normal.  Normal stress nuclear study with apical thinning but no ischemia or infarction.  Gated ejection fraction 63% with normal wall motion.   ECHOCARDIOGRAM  ECHOCARDIOGRAM COMPLETE 12/15/2023  Narrative ECHOCARDIOGRAM REPORT    Patient Name:   Willie Burton Date of Exam: 12/15/2023 Medical Rec #:  [id number]      Height:       69.0 in Accession #:    [id number]     Weight:       201.0 lb Date of Birth:  [birth year]      BSA:          2.070 m Patient Age:    77 years       BP:           119/66 mmHg Patient Gender: M              HR:           64 bpm. Exam Location:  [address]  Procedure: 2D Echo, 3D Echo, Cardiac Doppler, Color Doppler and Strain Analysis  Indications:  R06.00 Dyspnea  History:        Patient has no prior history of Echocardiogram examinations. Prior CABG, Carotid Disease and PAD; Risk Factors:Hypertension.  Sonographer:    Waldo Guadalajara RCS Referring Phys: [id number] MARK C SKAINS  IMPRESSIONS   1. Left ventricular ejection fraction, by estimation, is 60 to 65%. Left ventricular ejection fraction by 3D volume is 65 %. The left ventricle has normal function. The left ventricle has no regional wall motion abnormalities. Left ventricular diastolic parameters were normal. The average left ventricular global longitudinal strain is -21.8 %. The global longitudinal strain is normal. 2. Right ventricular  systolic function is normal. The right ventricular size is normal. There is normal pulmonary artery systolic pressure. 3. The mitral valve is normal in structure. No evidence of mitral valve regurgitation. No evidence of mitral stenosis. 4. The aortic valve is tricuspid. There is mild calcification of the aortic valve. There is mild thickening of the aortic valve. Aortic valve regurgitation is not visualized. No aortic stenosis is present. 5. The inferior vena cava is normal in size with greater than 50% respiratory variability, suggesting right atrial pressure of 3 mmHg.  FINDINGS Left Ventricle: Left ventricular ejection fraction, by estimation, is 60 to 65%. Left ventricular ejection fraction by 3D volume is 65 %. The left ventricle has normal function. The left ventricle has no regional wall motion abnormalities. The average left ventricular global longitudinal strain is -21.8 %. The global longitudinal strain is normal. The left ventricular internal cavity size was normal in size. There is no left ventricular hypertrophy. Abnormal (paradoxical) septal motion consistent with post-operative status. Left ventricular diastolic parameters were normal.  Right Ventricle: The right ventricular size is normal. No increase in right ventricular wall thickness. Right ventricular systolic function is normal. There is normal pulmonary artery systolic pressure. The tricuspid regurgitant velocity is 1.12 m/s, and with an assumed right atrial pressure of 3 mmHg, the estimated right ventricular systolic pressure is 8.0 mmHg.  Left Atrium: Left atrial size was normal in size.  Right Atrium: Right atrial size was normal in size.  Pericardium: There is no evidence of pericardial effusion.  Mitral Valve: The mitral valve is normal in structure. No evidence of mitral valve regurgitation. No evidence of mitral valve stenosis.  Tricuspid Valve: The tricuspid valve is normal in structure. Tricuspid valve  regurgitation is trivial. No evidence of tricuspid stenosis.  Aortic Valve: The aortic valve is tricuspid. There is mild calcification of the aortic valve. There is mild thickening of the aortic valve. Aortic valve regurgitation is not visualized. No aortic stenosis is present.  Pulmonic Valve: The pulmonic valve was normal in structure. Pulmonic valve regurgitation is mild to moderate. No evidence of pulmonic stenosis.  Aorta: The aortic root and ascending aorta are structurally normal, with no evidence of dilitation.  Venous: The inferior vena cava is normal in size with greater than 50% respiratory variability, suggesting right atrial pressure of 3 mmHg.  IAS/Shunts: No atrial level shunt detected by color flow Doppler.   LEFT VENTRICLE PLAX 2D LVIDd:         5.00 cm         Diastology LVIDs:         3.00 cm         LV e' medial:    9.68 cm/s LV PW:         0.80 cm         LV E/e' medial:  9.6 LV IVS:  0.80 cm         LV e' lateral:   12.10 cm/s LVOT diam:     2.00 cm         LV E/e' lateral: 7.7 LV SV:         71 LV SV Index:   34              2D LVOT Area:     3.14 cm        Longitudinal Strain 2D Strain GLS  -19.3 % (A2C): 2D Strain GLS  -26.4 % (A3C): 2D Strain GLS  -19.8 % (A4C): 2D Strain GLS  -21.8 % Avg:  3D Volume EF LV 3D EF:    Left ventricul ar ejection fraction by 3D volume is 65 %.  3D Volume EF: 3D EF:        65 % LV EDV:       123 ml LV ESV:       43 ml LV SV:        79 ml  RIGHT VENTRICLE RV Basal diam:  3.10 cm RV S prime:     8.49 cm/s TAPSE (M-mode): 2.1 cm RVSP:           8.0 mmHg  LEFT ATRIUM             Index        RIGHT ATRIUM           Index LA diam:        4.00 cm 1.93 cm/m   RA Pressure: 3.00 mmHg LA Vol (A2C):   48.1 ml 23.23 ml/m  RA Area:     14.50 cm LA Vol (A4C):   38.5 ml 18.60 ml/m  RA Volume:   38.50 ml  18.60 ml/m LA Biplane Vol: 44.6 ml 21.54 ml/m AORTIC VALVE LVOT Vmax:   95.00 cm/s LVOT Vmean:   68.600 cm/s LVOT VTI:    0.226 m  AORTA Ao Root diam: 3.60 cm Ao Asc diam:  3.80 cm  MITRAL VALVE                TRICUSPID VALVE MV Area (PHT):              TR Peak grad:   5.0 mmHg MV Decel Time:              TR Vmax:        112.00 cm/s MV E velocity: 92.80 cm/s   Estimated RAP:  3.00 mmHg MV A velocity: 110.00 cm/s  RVSP:           8.0 mmHg MV E/A ratio:  0.84 SHUNTS Systemic VTI:  0.23 m Systemic Diam: 2.00 cm  Morene Brownie Electronically signed by Morene Brownie Signature Date/Time: 12/15/2023/9:48:24 AM    Final          ______________________________________________________________________________________________         Risk Assessment/Calculations:          Physical Exam:   VS:  BP 116/60 (BP Location: Left Arm, Patient Position: Sitting, Cuff Size: Normal)   Pulse 64   Resp 16   Ht 5' 9 (1.753 m)   Wt 201 lb (91.2 kg)   SpO2 100%   BMI 29.68 kg/m    Wt Readings from Last 3 Encounters:  08/15/24 201 lb (91.2 kg)  02/23/24 208 lb 12.8 oz (94.7 kg)  02/02/24 198 lb 12.8 oz (90.2 kg)    GEN: Well nourished, well developed in no acute  distress NECK: No JVD; No carotid bruits CARDIAC: RRR, no murmurs, rubs, gallops RESPIRATORY:  Clear to auscultation without rales, wheezing or rhonchi  ABDOMEN: Soft, non-tender, non-distended EXTREMITIES:  No edema; No deformity   ASSESSMENT AND PLAN: .    CAD s/p CABG / HLD, LDL goal <70 - Stable with no anginal symptoms. No indication for ischemic evaluation.  GDMT aspirin  81mg  daily, rosuvastatin  5mg  daily, zetia  10mg  daily. Refills provided. Lipid panel by PCP with LDL at goal <70. No BB due to baseline bradycardia. Recommend aiming for 150 minutes of moderate intensity activity per week and following a heart healthy diet.    HTN - BP at goal <130/80 at home and in clinic. SABRAContinue Amlodipine -Olmesartan  5-20mg  daily. Discussed to monitor BP at home at least 2 hours after medications and sitting for 5-10  minutes. Two episodes of lightheadedness and relatively hypotensive today. If lightheadedness persists, consider stop Amlodipine  and Rx solely Olmesartan  20mg  daily.   PAD - Follows with [facility].       Dispo: follow up in 1 year  Signed, Reche GORMAN Finder, NP

## 2024-08-15 NOTE — Patient Instructions (Signed)
 Medication Instructions:  Continue your current medications  *If you need a refill on your cardiac medications before your next appointment, please call your pharmacy*  Testing/Procedures: Your EKG today looks great!  Follow-Up: At [facility], you and your health needs are our priority.  As part of our continuing mission to provide you with exceptional heart care, our providers are all part of one team.  This team includes your primary Cardiologist (physician) and Advanced Practice Providers or APPs (Physician Assistants and Nurse Practitioners) who all work together to provide you with the care you need, when you need it.  Your next appointment:   1 year(s)  Provider:   Oneil Parchment, MD or Reche Finder, NP    We recommend signing up for the patient portal called MyChart.  Sign up information is provided on this After Visit Summary.  MyChart is used to connect with patients for Virtual Visits (Telemedicine).  Patients are able to view lab/test results, encounter notes, upcoming appointments, etc.  Non-urgent messages can be sent to your provider as well.   To learn more about what you can do with MyChart, go to [email or web address].   Other Instructions  If you see blood pressure consistently less than 120/80 or if your lightheadedness worsens, let us  know and we can consider changing your Amlodipine -olmesartan  to just Olmesartan .

## 2024-08-17 ENCOUNTER — Ambulatory Visit (HOSPITAL_COMMUNITY)
Admission: RE | Admit: 2024-08-17 | Discharge: 2024-08-17 | Disposition: A | Discharge status: Home / Self Care | Source: Ambulatory Visit | Attending: Vascular Surgery | Admitting: Vascular Surgery

## 2024-08-17 ENCOUNTER — Other Ambulatory Visit: Payer: Self-pay | Admitting: Internal Medicine

## 2024-08-17 ENCOUNTER — Other Ambulatory Visit (HOSPITAL_COMMUNITY): Payer: Self-pay | Admitting: Internal Medicine

## 2024-08-17 DIAGNOSIS — R609 Edema, unspecified: Secondary | ICD-10-CM | POA: Diagnosis present

## 2024-08-17 DIAGNOSIS — R232 Flushing: Secondary | ICD-10-CM

## 2024-08-18 ENCOUNTER — Encounter (HOSPITAL_COMMUNITY)

## 2024-08-18 ENCOUNTER — Ambulatory Visit

## 2024-08-22 ENCOUNTER — Ambulatory Visit
Admission: RE | Admit: 2024-08-22 | Discharge: 2024-08-22 | Disposition: A | Discharge status: Home / Self Care | Source: Ambulatory Visit | Attending: Internal Medicine

## 2024-08-22 DIAGNOSIS — R232 Flushing: Secondary | ICD-10-CM

## 2024-08-22 MED ORDER — IOPAMIDOL (ISOVUE-300) INJECTION 61%
100.0000 mL | Freq: Once | INTRAVENOUS | Status: AC | PRN
  Administered 2024-08-22: 100 mL via INTRAVENOUS

## 2024-09-08 ENCOUNTER — Ambulatory Visit (INDEPENDENT_AMBULATORY_CARE_PROVIDER_SITE_OTHER): Admitting: Physician Assistant

## 2024-09-08 ENCOUNTER — Ambulatory Visit (HOSPITAL_COMMUNITY)
Admission: RE | Admit: 2024-09-08 | Discharge: 2024-09-08 | Disposition: A | Discharge status: Home / Self Care | Source: Ambulatory Visit | Attending: Vascular Surgery | Admitting: Vascular Surgery

## 2024-09-08 VITALS — BP 123/74 | HR 59 | Temp 97.7°F | Wt 201.8 lb

## 2024-09-08 DIAGNOSIS — I739 Peripheral vascular disease, unspecified: Secondary | ICD-10-CM

## 2024-09-08 DIAGNOSIS — I70201 Unspecified atherosclerosis of native arteries of extremities, right leg: Secondary | ICD-10-CM | POA: Insufficient documentation

## 2024-09-08 LAB — VAS US ABI WITH/WO TBI
Left ABI: 1.23
Right ABI: 1.23

## 2024-09-08 NOTE — Progress Notes (Signed)
 Office Note     CC:  follow up Requesting Provider:  Stephane Leita DEL, MD  HPI: Willie Burton is a 78 y.o. (1946-07-05) male who presents for surveillance follow up of PAD. 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 Harvey.  He has since had bilateral lower extremity claudication that we have been following. This has been non lifestyle limiting. His pain is more in hip region/ buttock L> R. Occurs after approximately 100 feet. He does have a gluteal tear on the left and history of lumbar spinal stenosis and disc protrusions. He was undergoing outpatient PT with Orthopedics until July.   Today he reports overall doing great. He has noticed some swelling or fullness in the distal medial right thigh along area when his surgical incision is. He says this has been present for several months. No real change. It does not bother him at all. No pain or overlying skin changes. The incision has long been healed. He denies any pain in his legs on ambulation or rest. No tissue loss. He stays very active. He is medically managed on Aspirin , Statin and Zetia .  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   Arthritis    Colon polyp    Coronary artery disease    Diverticula of colon    Dysrhythmia    PACs, PVCs   Heart disease    High blood pressure    High cholesterol    Hyperlipemia    Peripheral vascular disease    Prostate cancer [facility])     Past Surgical History:  Procedure Laterality Date   ABDOMINAL AORTOGRAM W/LOWER EXTREMITY N/A 10/16/2017   Procedure: ABDOMINAL AORTOGRAM W/LOWER EXTREMITY;  Surgeon: Harvey Carlin BRAVO, MD;  Location: [facility] INVASIVE CV [facility];  Service: Cardiovascular;  Laterality: N/A;   APPENDECTOMY     CARDIAC CATHETERIZATION N/A 11/13/2015   Procedure: Left Heart Cath and Coronary Angiography;  Surgeon: Vinie DELENA Jude, MD;  Location: [facility] INVASIVE CV LAB;  Service:  Cardiovascular;  Laterality: N/A;   CATARACT EXTRACTION     COLON SURGERY  2008   right colon resection   COLONOSCOPY WITH PROPOFOL  N/A 01/06/2018   Procedure: COLONOSCOPY WITH PROPOFOL ;  Surgeon: Toledo, Ladell POUR, MD;  Location: [facility] ENDOSCOPY;  Service: Gastroenterology;  Laterality: N/A;   ENDARTERECTOMY POPLITEAL Right 11/11/2017   FEMORAL-POPLITEAL BYPASS GRAFT Right 11/11/2017   Procedure: RIGHT POPLITEAL ENDARTERECTOMY;  Surgeon: Harvey Carlin BRAVO, MD;  Location: [facility] OR;  Service: Vascular;  Laterality: Right;   LIPOMA EXCISION Right    thigh   METACARPOPHALANGEAL JOINT ARTHROPLASTY     PATCH ANGIOPLASTY Right 11/11/2017   Procedure: PATCH ANGIOPLASTY of Superficial Fenoral Artery;  Surgeon: Harvey Carlin BRAVO, MD;  Location: [facility] OR;  Service: Vascular;  Laterality: Right;   PROSTATECTOMY  2003   ULNAR NERVE TRANSPOSITION Left January 2015    Social History   Socioeconomic History   Marital status: Married    Spouse name: Not on file   Number of children: 2   Years of education: Masters   Highest education level: Not on file  Occupational History   Occupation: retired  Tobacco Use   Smoking status: Former    Current packs/day: 0.00    Average packs/day: 1.5 packs/day for 5.0 years (7.5 ttl pk-yrs)    Types: Cigarettes    Start date: 11    Quit date: 1985  Years since quitting: 40.7    Passive exposure: Never   Smokeless tobacco: Never   Tobacco comments:    Quit 40+ years ago  Vaping Use   Vaping status: Never Used  Substance and Sexual Activity   Alcohol use: No    Alcohol/week: 0.0 standard drinks of alcohol   Drug use: No   Sexual activity: Not on file  Other Topics Concern   Not on file  Social History Narrative   He lives with his wife.   Retired Art gallery manager.   Highest level of education:  Teacher, early years/pre    Right-handed.   1-2 cups caffeine per day.   Social Drivers of Corporate investment banker Strain: Not on file  Food Insecurity: Not on file   Transportation Needs: Not on file  Physical Activity: Not on file  Stress: Not on file  Social Connections: Not on file  Intimate Partner Violence: Not on file    Family History  Problem Relation Age of Onset   COPD Mother        Deceased, 81   Breast cancer Mother    Liver disease Mother    Esophageal cancer Father    Throat cancer Father        Deceased, 28s   Healthy Brother    Healthy Daughter    Healthy Son    Colon cancer Neg Hx     Current Outpatient Medications  Medication Sig Dispense Refill   amLODipine -olmesartan  (AZOR ) 5-20 MG tablet Take 1 tablet by mouth daily. 90 tablet 3   aspirin  EC 81 MG tablet Take 81 mg daily by mouth.      cholecalciferol  (VITAMIN D ) 1000 units tablet Take 1,000 Units daily by mouth.     ezetimibe  (ZETIA ) 10 MG tablet Take 1 tablet (10 mg total) by mouth daily. 90 tablet 3   MAGNESIUM -OXIDE 400 (241.3 Mg) MG tablet Take 400 mg by mouth daily.  11   Multiple Vitamins-Minerals (ICAPS AREDS 2 PO) Take 2 capsules by mouth daily.     rosuvastatin  (CRESTOR ) 5 MG tablet Take 1 tablet (5 mg total) by mouth daily. 90 tablet 3   vitamin B-12 (CYANOCOBALAMIN ) 1000 MCG tablet Take 1,000 mcg by mouth daily. Reported on 12/31/2015     No current facility-administered medications for this visit.    Allergies  Allergen Reactions   Pravastatin Sodium Other (See Comments)    Muscle aches    Amoxicillin Hives and Rash    Has patient had a PCN reaction causing immediate rash, facial/tongue/throat swelling, SOB or lightheadedness with hypotension: Yes Has patient had a PCN reaction causing severe rash involving mucus membranes or skin necrosis: No Has patient had a PCN reaction that required hospitalization: No Has patient had a PCN reaction occurring within the last 10 years: No If all of the above answers are NO, then may proceed with Cephalosporin use.    Hepatitis A Virus Vaccine Inactivated Rash   Typhoid Vaccines Rash     REVIEW OF  SYSTEMS:  Negative unless noted in HPI [X]  denotes positive finding, [ ]  denotes negative finding Cardiac  Comments:  Chest pain or chest pressure:    Shortness of breath upon exertion:    Short of breath when lying flat:    Irregular heart rhythm:        Vascular    Pain in calf, thigh, or hip brought on by ambulation:    Pain in feet at night that wakes you up from your  sleep:     Blood clot in your veins:    Leg swelling:         Pulmonary    Oxygen at home:    Productive cough:     Wheezing:         Neurologic    Sudden weakness in arms or legs:     Sudden numbness in arms or legs:     Sudden onset of difficulty speaking or slurred speech:    Temporary loss of vision in one eye:     Problems with dizziness:         Gastrointestinal    Blood in stool:     Vomited blood:         Genitourinary    Burning when urinating:     Blood in urine:        Psychiatric    Major depression:         Hematologic    Bleeding problems:    Problems with blood clotting too easily:        Skin    Rashes or ulcers:        Constitutional    Fever or chills:      PHYSICAL EXAMINATION:  Vitals:   09/08/24 1000  BP: 123/74  Pulse: (!) 59  Temp: 97.7 F (36.5 C)  TempSrc: Temporal  Weight: 201 lb 12.8 oz (91.5 kg)    General:  WDWN in NAD; vital signs documented above Gait: Normal HENT: WNL, normocephalic Pulmonary: normal non-labored breathing Cardiac: regular HR; no carotid bruits Abdomen: soft Vascular Exam/Pulses: 2+ radial, 2+ femoral, 2+ popliteal, and DP pulses bilaterally Extremities: without ischemic changes, without Gangrene , without cellulitis; without open wounds;  Musculoskeletal: no muscle wasting or atrophy  Neurologic: A&O X 3 Psychiatric:  The pt has Normal affect.   Non-Invasive Vascular Imaging:   +-------+-----------+-----------+------------+------------+  ABI/TBIToday's ABIToday's TBIPrevious ABIPrevious TBI   +-------+-----------+-----------+------------+------------+  Right 1.23       0.89       1.20        0.97          +-------+-----------+-----------+------------+------------+  Left  1.23       0.79       1.19        0.79          +-------+-----------+-----------+------------+------------+    ASSESSMENT/PLAN:: 78 y.o. male here for surveillance follow up of PAD. 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 Harvey. He is without any claudication, rest pain, or tissue loss. Some fullness in right distal thigh. I am uncertain of what is causing this. Possibly some scar tissue. It is not causing him any discomfort at this time and does not seem to be worsening. Advised him to monitor to see if any changes overtime but otherwise not concerning.  - ABI today is stable bilaterally - Continue Aspirin  and Statin - he will follow up in 1 year with ABI   Teretha Damme, PA-C [facility] [phone]  Clinic MD:   [place]

## 2024-10-24 ENCOUNTER — Encounter (HOSPITAL_BASED_OUTPATIENT_CLINIC_OR_DEPARTMENT_OTHER): Payer: Self-pay
